# Patient Record
Sex: Female | Born: 1970 | Race: Black or African American | Hispanic: No | Marital: Married | Smoking: Never smoker
Health system: Southern US, Community
[De-identification: ages and names within clinical notes are randomized; demographics above are authoritative.]

## PROBLEM LIST (undated history)

## (undated) ENCOUNTER — Ambulatory Visit: Discharge status: Absent without leave | Payer: BC Managed Care – PPO

## (undated) DIAGNOSIS — Z8719 Personal history of other diseases of the digestive system: Secondary | ICD-10-CM

## (undated) DIAGNOSIS — K219 Gastro-esophageal reflux disease without esophagitis: Secondary | ICD-10-CM

## (undated) DIAGNOSIS — E559 Vitamin D deficiency, unspecified: Secondary | ICD-10-CM

## (undated) DIAGNOSIS — D649 Anemia, unspecified: Secondary | ICD-10-CM

## (undated) DIAGNOSIS — K2970 Gastritis, unspecified, without bleeding: Secondary | ICD-10-CM

## (undated) DIAGNOSIS — R42 Dizziness and giddiness: Secondary | ICD-10-CM

## (undated) DIAGNOSIS — E611 Iron deficiency: Secondary | ICD-10-CM

## (undated) DIAGNOSIS — Z9889 Other specified postprocedural states: Secondary | ICD-10-CM

## (undated) DIAGNOSIS — H8109 Meniere's disease, unspecified ear: Secondary | ICD-10-CM

## (undated) DIAGNOSIS — I10 Essential (primary) hypertension: Secondary | ICD-10-CM

## (undated) HISTORY — DX: Dizziness and giddiness: R42

## (undated) HISTORY — DX: Meniere's disease, unspecified ear: H81.09

## (undated) HISTORY — DX: Iron deficiency: E61.1

## (undated) HISTORY — DX: Vitamin D deficiency, unspecified: E55.9

---

## 2001-06-20 ENCOUNTER — Other Ambulatory Visit: Admission: RE | Admit: 2001-06-20 | Discharge: 2001-06-20 | Payer: Self-pay | Admitting: Specialist

## 2003-10-19 ENCOUNTER — Emergency Department (HOSPITAL_COMMUNITY): Admission: EM | Admit: 2003-10-19 | Discharge: 2003-10-19 | Payer: Self-pay | Admitting: Internal Medicine

## 2004-01-11 ENCOUNTER — Emergency Department (HOSPITAL_COMMUNITY): Admission: EM | Admit: 2004-01-11 | Discharge: 2004-01-11 | Payer: Self-pay | Admitting: Emergency Medicine

## 2004-04-19 ENCOUNTER — Emergency Department (HOSPITAL_COMMUNITY): Admission: EM | Admit: 2004-04-19 | Discharge: 2004-04-19 | Payer: Self-pay | Admitting: Emergency Medicine

## 2004-04-26 ENCOUNTER — Emergency Department (HOSPITAL_COMMUNITY): Admission: EM | Admit: 2004-04-26 | Discharge: 2004-04-26 | Payer: Self-pay | Admitting: Emergency Medicine

## 2004-05-05 ENCOUNTER — Ambulatory Visit (HOSPITAL_COMMUNITY): Admission: RE | Admit: 2004-05-05 | Discharge: 2004-05-05 | Payer: Self-pay | Admitting: Emergency Medicine

## 2005-12-03 ENCOUNTER — Emergency Department (HOSPITAL_COMMUNITY): Admission: EM | Admit: 2005-12-03 | Discharge: 2005-12-03 | Payer: Self-pay | Admitting: Emergency Medicine

## 2005-12-06 ENCOUNTER — Ambulatory Visit (HOSPITAL_COMMUNITY): Admission: RE | Admit: 2005-12-06 | Discharge: 2005-12-06 | Payer: Self-pay | Admitting: Dermatology

## 2006-04-27 ENCOUNTER — Emergency Department (HOSPITAL_COMMUNITY): Admission: EM | Admit: 2006-04-27 | Discharge: 2006-04-27 | Payer: Self-pay | Admitting: Emergency Medicine

## 2007-03-05 ENCOUNTER — Emergency Department (HOSPITAL_COMMUNITY): Admission: EM | Admit: 2007-03-05 | Discharge: 2007-03-05 | Payer: Self-pay | Admitting: Family Medicine

## 2007-09-01 ENCOUNTER — Emergency Department (HOSPITAL_COMMUNITY): Admission: EM | Admit: 2007-09-01 | Discharge: 2007-09-01 | Payer: Self-pay | Admitting: Emergency Medicine

## 2007-10-16 ENCOUNTER — Emergency Department (HOSPITAL_COMMUNITY): Admission: EM | Admit: 2007-10-16 | Discharge: 2007-10-16 | Payer: Self-pay | Admitting: Emergency Medicine

## 2007-12-27 ENCOUNTER — Emergency Department (HOSPITAL_COMMUNITY): Admission: EM | Admit: 2007-12-27 | Discharge: 2007-12-27 | Payer: Self-pay | Admitting: Emergency Medicine

## 2008-02-09 ENCOUNTER — Emergency Department (HOSPITAL_COMMUNITY): Admission: EM | Admit: 2008-02-09 | Discharge: 2008-02-09 | Payer: Self-pay | Admitting: Emergency Medicine

## 2008-03-13 ENCOUNTER — Encounter: Admission: RE | Admit: 2008-03-13 | Discharge: 2008-03-13 | Payer: Self-pay | Admitting: Otolaryngology

## 2009-08-03 ENCOUNTER — Emergency Department (HOSPITAL_COMMUNITY): Admission: EM | Admit: 2009-08-03 | Discharge: 2009-08-03 | Payer: Self-pay | Admitting: Emergency Medicine

## 2010-07-29 ENCOUNTER — Emergency Department (HOSPITAL_COMMUNITY): Admission: EM | Admit: 2010-07-29 | Discharge: 2010-07-29 | Payer: Self-pay | Admitting: Emergency Medicine

## 2010-10-01 ENCOUNTER — Ambulatory Visit (HOSPITAL_COMMUNITY): Admission: RE | Admit: 2010-10-01 | Discharge: 2010-10-01 | Payer: Self-pay | Admitting: Family Medicine

## 2011-02-03 LAB — RAPID STREP SCREEN (MED CTR MEBANE ONLY): Streptococcus, Group A Screen (Direct): NEGATIVE

## 2012-10-04 ENCOUNTER — Ambulatory Visit (INDEPENDENT_AMBULATORY_CARE_PROVIDER_SITE_OTHER): Payer: Self-pay | Admitting: Otolaryngology

## 2012-11-19 ENCOUNTER — Emergency Department (HOSPITAL_COMMUNITY): Payer: Managed Care, Other (non HMO)

## 2012-11-19 ENCOUNTER — Emergency Department (HOSPITAL_COMMUNITY)
Admission: EM | Admit: 2012-11-19 | Discharge: 2012-11-19 | Disposition: A | Discharge status: Home / Self Care | Payer: Managed Care, Other (non HMO) | Attending: Emergency Medicine | Admitting: Emergency Medicine

## 2012-11-19 ENCOUNTER — Encounter (HOSPITAL_COMMUNITY): Payer: Self-pay | Admitting: Emergency Medicine

## 2012-11-19 DIAGNOSIS — R002 Palpitations: Secondary | ICD-10-CM | POA: Insufficient documentation

## 2012-11-19 DIAGNOSIS — R000 Tachycardia, unspecified: Secondary | ICD-10-CM

## 2012-11-19 DIAGNOSIS — R112 Nausea with vomiting, unspecified: Secondary | ICD-10-CM | POA: Insufficient documentation

## 2012-11-19 DIAGNOSIS — Z3202 Encounter for pregnancy test, result negative: Secondary | ICD-10-CM | POA: Insufficient documentation

## 2012-11-19 DIAGNOSIS — R5381 Other malaise: Secondary | ICD-10-CM | POA: Insufficient documentation

## 2012-11-19 LAB — URINALYSIS, ROUTINE W REFLEX MICROSCOPIC
Bilirubin Urine: NEGATIVE
Glucose, UA: NEGATIVE mg/dL
Protein, ur: NEGATIVE mg/dL
Urobilinogen, UA: 0.2 mg/dL (ref 0.0–1.0)

## 2012-11-19 LAB — COMPREHENSIVE METABOLIC PANEL
ALT: 10 U/L (ref 0–35)
Alkaline Phosphatase: 53 U/L (ref 39–117)
BUN: 9 mg/dL (ref 6–23)
CO2: 24 mEq/L (ref 19–32)
Chloride: 107 mEq/L (ref 96–112)
GFR calc Af Amer: 78 mL/min — ABNORMAL LOW (ref >90)
Glucose, Bld: 90 mg/dL (ref 70–99)
Potassium: 3.4 mEq/L — ABNORMAL LOW (ref 3.5–5.1)
Total Bilirubin: 0.8 mg/dL (ref 0.3–1.2)

## 2012-11-19 LAB — POCT I-STAT TROPONIN I

## 2012-11-19 LAB — URINE MICROSCOPIC-ADD ON

## 2012-11-19 LAB — CBC WITH DIFFERENTIAL/PLATELET
Hemoglobin: 13.9 g/dL (ref 12.0–15.0)
Lymphocytes Relative: 15 % (ref 12–46)
Lymphs Abs: 1.6 10*3/uL (ref 0.7–4.0)
MCH: 29.7 pg (ref 26.0–34.0)
Monocytes Relative: 5 % (ref 3–12)
Neutro Abs: 8.4 10*3/uL — ABNORMAL HIGH (ref 1.7–7.7)
Neutrophils Relative %: 79 % — ABNORMAL HIGH (ref 43–77)
RBC: 4.68 MIL/uL (ref 3.87–5.11)
WBC: 10.7 10*3/uL — ABNORMAL HIGH (ref 4.0–10.5)

## 2012-11-19 MED ORDER — IOHEXOL 350 MG/ML SOLN
100.0000 mL | Freq: Once | INTRAVENOUS | Status: AC | PRN
  Administered 2012-11-19: 100 mL via INTRAVENOUS

## 2012-11-19 MED ORDER — SODIUM CHLORIDE 0.9 % IV SOLN: INTRAVENOUS | Status: DC

## 2012-11-19 MED ORDER — IBUPROFEN 800 MG PO TABS
800.0000 mg | ORAL_TABLET | Freq: Once | ORAL | Status: AC
  Administered 2012-11-19: 800 mg via ORAL
  Filled 2012-11-19: qty 1

## 2012-11-19 MED ORDER — SODIUM CHLORIDE 0.9 % IV BOLUS (SEPSIS)
1000.0000 mL | Freq: Once | INTRAVENOUS | Status: AC
  Administered 2012-11-19: 1000 mL via INTRAVENOUS

## 2012-11-19 NOTE — ED Provider Notes (Addendum)
History   This chart was scribed for Toy Baker, MD by Charolett Bumpers, ED Scribe. The patient was seen in room APA06/APA06. Patient's care was started at 1510.   CSN: [id number]  Arrival date & time 11/19/12  1506   First MD Initiated Contact with Patient 11/19/12 1510      Chief Complaint  Patient presents with  . Tachycardia  . Fatigue    The history is provided by the patient. No language interpreter was used.  Karla Ward is a 42 y.o. female who presents to the Emergency Department complaining of sudden onset palpitations with associated fatigue, nausea and one episode of vomiting that started last night after coming home. She denies any illness prior to onset. She also reports chest pain described as a discomfort and pounding sensation that occurs with deep breaths. She reports an episode of similar symptoms that spontaneously resolved in the past but has not seen a cardiologist or been dx. She denies any recent illnesses. She denies any SOB, cough, fever, leg pain, leg swelling. She denies any recent travel or excessive caffeine use. She denies any drug use or h/o thyroid problems. She denies any chance of pregnancy.   PCP: Dr. Sudie Bailey   History reviewed. No pertinent past medical history.  History reviewed. No pertinent past surgical history.  History reviewed. No pertinent family history.  History  Substance Use Topics  . Smoking status: Never Smoker   . Smokeless tobacco: Never Used  . Alcohol Use: No    OB History    Grav Para Term Preterm Abortions TAB SAB Ect Mult Living                  Review of Systems  Constitutional: Positive for fatigue. Negative for fever.  Respiratory: Negative for cough and shortness of breath.   Cardiovascular: Positive for chest pain and palpitations. Negative for leg swelling.  Gastrointestinal: Positive for nausea and vomiting.  All other systems reviewed and are negative.    Allergies  Penicillins  Home  Medications  No current outpatient prescriptions on file.  BP 141/96  Pulse 95  Temp 98.2 F (36.8 C)  Resp 20  SpO2 100%  Physical Exam  Nursing note and vitals reviewed. Constitutional: She is oriented to person, place, and time. She appears well-developed and well-nourished.  Non-toxic appearance. No distress.  HENT:  Head: Normocephalic and atraumatic.  Eyes: Conjunctivae normal, EOM and lids are normal. Pupils are equal, round, and reactive to light.  Neck: Normal range of motion. Neck supple. No tracheal deviation present. No mass present.       No nodules or palpable goiter.   Cardiovascular: Regular rhythm and normal heart sounds.  Tachycardia present.  Exam reveals no gallop.   No murmur heard. Pulmonary/Chest: Effort normal and breath sounds normal. No stridor. No respiratory distress. She has no decreased breath sounds. She has no wheezes. She has no rhonchi. She has no rales.  Abdominal: Soft. Normal appearance and bowel sounds are normal. She exhibits no distension. There is no tenderness. There is no rebound and no CVA tenderness.  Musculoskeletal: Normal range of motion. She exhibits no edema and no tenderness.  Neurological: She is alert and oriented to person, place, and time. She has normal strength. No cranial nerve deficit or sensory deficit. GCS eye subscore is 4. GCS verbal subscore is 5. GCS motor subscore is 6.  Skin: Skin is warm and dry. No abrasion and no rash noted.  Psychiatric: She  has a normal mood and affect. Her speech is normal and behavior is normal.    ED Course  Procedures (including critical care time)  DIAGNOSTIC STUDIES: Oxygen Saturation is 100% on room air, normal by my interpretation.    COORDINATION OF CARE:  15:28-Discussed planned course of treatment with the patient including IV fluids, chest CT, UA and blood work, who is agreeable at this time.    Labs Reviewed - No data to display No results found.   No diagnosis  found.    MDM      Rate: 95   Rhythm: normal sinus rhythm  QRS Axis: normal  Intervals: normal  ST/T Wave abnormalities: normal  Conduction Disutrbances:none  Narrative Interpretation:   Old EKG Reviewed: none available   I personally performed the services described in this documentation, which was scribed in my presence. The recorded information has been reviewed and is accurate.  Pt with negative w/u here, thyroid fx pending, no signs of pe, stable for d/c No concern for acs    Toy Baker, MD 11/19/12 1712  Toy Baker, MD 11/19/12 [phone]

## 2012-11-19 NOTE — ED Notes (Signed)
Pt c/o "rapid heart beat" and fatigue since last night. Pt states last night she began having chest discomfort and she could "feel my heart pounding very fast". Pt also states when she stands up she becomes fatigued. Pt only reports chest pain when inhales heavily. Pt reports 1 episode on vomiting pta and still reports nausea. Pt states she's had these symptoms in the past but has never been tx or diagnosed for anything related. Pt states symptoms are worse than previous episodes.

## 2012-11-20 LAB — T4: T4, Total: 8.6 ug/dL (ref 5.0–12.5)

## 2012-11-21 LAB — URINE CULTURE: Culture: NO GROWTH

## 2012-11-22 ENCOUNTER — Ambulatory Visit (INDEPENDENT_AMBULATORY_CARE_PROVIDER_SITE_OTHER): Payer: Managed Care, Other (non HMO) | Admitting: Cardiovascular Disease

## 2012-11-22 ENCOUNTER — Encounter: Payer: Self-pay | Admitting: Cardiovascular Disease

## 2012-11-22 VITALS — BP 144/100 | HR 80 | Ht 64.0 in | Wt 172.0 lb

## 2012-11-22 DIAGNOSIS — R002 Palpitations: Secondary | ICD-10-CM | POA: Insufficient documentation

## 2012-11-22 DIAGNOSIS — K30 Functional dyspepsia: Secondary | ICD-10-CM

## 2012-11-22 DIAGNOSIS — K3189 Other diseases of stomach and duodenum: Secondary | ICD-10-CM

## 2012-11-22 NOTE — Assessment & Plan Note (Signed)
Benign sounding ? Secondary to infuenza or gi virus.  Echo to r/o structural heart disease.  Offered event monitor if symptoms continue

## 2012-11-22 NOTE — Patient Instructions (Signed)
Your physician recommends that you schedule a follow-up appointment in: As needed  Your physician has requested that you have an echocardiogram. Echocardiography is a painless test that uses sound waves to create images of your heart. It provides your doctor with information about the size and shape of your heart and how well your heart's chambers and valves are working. This procedure takes approximately one hour. There are no restrictions for this procedure.  Call me next week if you are still experiencing palpitations and we will order an event monitor

## 2012-11-22 NOTE — Assessment & Plan Note (Signed)
Some influenza virus' can have GI overtones I think this is what is causing her symptoms and fatigue  F/U Dr Sudie Bailey Stay hydrated  Not started on Tamaflu in ER No clinical evidence of GB disease

## 2012-11-22 NOTE — Progress Notes (Signed)
Patient ID: Karla Ward, female   DOB: [birth year], 42 y.o.   MRN: [id number] Karla Ward is a 42 y.o. female who presents to the Emergency Department complaining of sudden onset palpitations with associated fatigue, nausea and one episode of vomiting that started last night after coming home. She denies any illness prior to onset. She also reports chest pain described as a discomfort and pounding sensation that occurs with deep breaths. She reports an episode of similar symptoms that spontaneously resolved in the past but has not seen a cardiologist or been dx. She denies any recent illnesses. She denies any SOB, cough, fever, leg pain, leg swelling. She denies any recent travel or excessive caffeine use. She denies any drug use or h/o thyroid problems. TSH and CBC normal in ER  Still with some stomach upset and one episode of vohmiting  Feels pulse is high especially in recumbancy No fevers  ROS: Denies fever, malais, weight loss, blurry vision, decreased visual acuity, cough, sputum, SOB, hemoptysis, pleuritic pain, palpitaitons, heartburn, abdominal pain, melena, lower extremity edema, claudication, or rash.  All other systems reviewed and negative   General: Affect appropriate Healthy:  appears stated age HEENT: normal Neck supple with no adenopathy JVP normal no bruits no thyromegaly Lungs clear with no wheezing and good diaphragmatic motion Heart:  S1/S2 no murmur,rub, gallop or click PMI normal Abdomen: benighn, BS positve, no tenderness, no AAA no bruit.  No HSM or HJR Distal pulses intact with no bruits No edema Neuro non-focal Skin warm and dry No muscular weakness  Medications Current Outpatient Prescriptions  Medication Sig Dispense Refill  . loratadine (CLARITIN) 10 MG tablet Take 10 mg by mouth as needed.       Marland Kitchen LORazepam (ATIVAN) 1 MG tablet Take 1 mg by mouth daily as needed. *To be taken at the first sign of a muscle tension headache      . medroxyPROGESTERone  (DEPO-PROVERA) 150 MG/ML injection Inject 150 mg into the muscle every 3 (three) months.        Allergies Penicillins  Family History: No family history on file.  Social History: History   Social History  . Marital Status: Married    Spouse Name: N/A    Number of Children: N/A  . Years of Education: N/A   Occupational History  . Not on file.   Social History Main Topics  . Smoking status: Never Smoker   . Smokeless tobacco: Never Used  . Alcohol Use: No  . Drug Use:   . Sexually Active:    Other Topics Concern  . Not on file   Social History Narrative  . No narrative on file    Electrocardiogram: 1/21  SR rate 91 normal  Assessment and Plan

## 2012-11-26 ENCOUNTER — Ambulatory Visit (HOSPITAL_COMMUNITY): Payer: Managed Care, Other (non HMO)

## 2012-11-28 ENCOUNTER — Ambulatory Visit (HOSPITAL_COMMUNITY)
Admission: RE | Admit: 2012-11-28 | Discharge: 2012-11-28 | Disposition: A | Discharge status: Home / Self Care | Payer: Managed Care, Other (non HMO) | Source: Ambulatory Visit | Attending: Cardiovascular Disease | Admitting: Cardiovascular Disease

## 2012-11-28 DIAGNOSIS — R002 Palpitations: Secondary | ICD-10-CM

## 2012-11-28 DIAGNOSIS — R079 Chest pain, unspecified: Secondary | ICD-10-CM | POA: Insufficient documentation

## 2012-11-28 DIAGNOSIS — K30 Functional dyspepsia: Secondary | ICD-10-CM

## 2012-11-28 NOTE — Progress Notes (Signed)
*  PRELIMINARY RESULTS* Echocardiogram 2D Echocardiogram has been performed.  Conrad Anderson 11/28/2012, 1:47 PM

## 2012-11-30 ENCOUNTER — Emergency Department (HOSPITAL_COMMUNITY)
Admission: EM | Admit: 2012-11-30 | Discharge: 2012-11-30 | Disposition: A | Discharge status: Home / Self Care | Payer: Managed Care, Other (non HMO) | Attending: Emergency Medicine | Admitting: Emergency Medicine

## 2012-11-30 ENCOUNTER — Encounter (HOSPITAL_COMMUNITY): Payer: Self-pay | Admitting: *Deleted

## 2012-11-30 DIAGNOSIS — Z8679 Personal history of other diseases of the circulatory system: Secondary | ICD-10-CM | POA: Insufficient documentation

## 2012-11-30 DIAGNOSIS — R51 Headache: Secondary | ICD-10-CM | POA: Insufficient documentation

## 2012-11-30 DIAGNOSIS — K529 Noninfective gastroenteritis and colitis, unspecified: Secondary | ICD-10-CM

## 2012-11-30 DIAGNOSIS — R059 Cough, unspecified: Secondary | ICD-10-CM | POA: Insufficient documentation

## 2012-11-30 DIAGNOSIS — IMO0001 Reserved for inherently not codable concepts without codable children: Secondary | ICD-10-CM | POA: Insufficient documentation

## 2012-11-30 DIAGNOSIS — Z79899 Other long term (current) drug therapy: Secondary | ICD-10-CM | POA: Insufficient documentation

## 2012-11-30 DIAGNOSIS — R112 Nausea with vomiting, unspecified: Secondary | ICD-10-CM | POA: Insufficient documentation

## 2012-11-30 DIAGNOSIS — Z3202 Encounter for pregnancy test, result negative: Secondary | ICD-10-CM | POA: Insufficient documentation

## 2012-11-30 DIAGNOSIS — K5289 Other specified noninfective gastroenteritis and colitis: Secondary | ICD-10-CM | POA: Insufficient documentation

## 2012-11-30 DIAGNOSIS — R05 Cough: Secondary | ICD-10-CM | POA: Insufficient documentation

## 2012-11-30 LAB — CBC WITH DIFFERENTIAL/PLATELET
Basophils Absolute: 0 10*3/uL (ref 0.0–0.1)
Basophils Relative: 0 % (ref 0–1)
Eosinophils Relative: 3 % (ref 0–5)
HCT: 42.6 % (ref 36.0–46.0)
Hemoglobin: 14.4 g/dL (ref 12.0–15.0)
MCH: 30 pg (ref 26.0–34.0)
MCHC: 33.8 g/dL (ref 30.0–36.0)
MCV: 88.8 fL (ref 78.0–100.0)
Monocytes Absolute: 0.4 10*3/uL (ref 0.1–1.0)
Monocytes Relative: 5 % (ref 3–12)
RDW: 13.5 % (ref 11.5–15.5)

## 2012-11-30 LAB — URINALYSIS, ROUTINE W REFLEX MICROSCOPIC
Leukocytes, UA: NEGATIVE
Nitrite: NEGATIVE
Specific Gravity, Urine: 1.02 (ref 1.005–1.030)
pH: 7 (ref 5.0–8.0)

## 2012-11-30 LAB — HEPATIC FUNCTION PANEL
Albumin: 4.1 g/dL (ref 3.5–5.2)
Alkaline Phosphatase: 51 U/L (ref 39–117)
Total Protein: 7.8 g/dL (ref 6.0–8.3)

## 2012-11-30 LAB — URINE MICROSCOPIC-ADD ON

## 2012-11-30 LAB — BASIC METABOLIC PANEL
CO2: 25 mEq/L (ref 19–32)
Calcium: 9.8 mg/dL (ref 8.4–10.5)
Sodium: 139 mEq/L (ref 135–145)

## 2012-11-30 LAB — LIPASE, BLOOD: Lipase: 30 U/L (ref 11–59)

## 2012-11-30 LAB — POCT PREGNANCY, URINE: Preg Test, Ur: NEGATIVE

## 2012-11-30 MED ORDER — PROMETHAZINE HCL 12.5 MG PO TABS
12.5000 mg | ORAL_TABLET | Freq: Once | ORAL | Status: DC
  Filled 2012-11-30: qty 1

## 2012-11-30 MED ORDER — HYDROCODONE-ACETAMINOPHEN 5-325 MG PO TABS
2.0000 | ORAL_TABLET | Freq: Once | ORAL | Status: DC
  Filled 2012-11-30: qty 2

## 2012-11-30 MED ORDER — HYDROCODONE-ACETAMINOPHEN 5-325 MG PO TABS: ORAL_TABLET | ORAL | Status: DC

## 2012-11-30 MED ORDER — ONDANSETRON HCL 4 MG PO TABS: 4.0000 mg | ORAL_TABLET | Freq: Four times a day (QID) | ORAL | Status: DC

## 2012-11-30 MED ORDER — ONDANSETRON 4 MG PO TBDP
4.0000 mg | ORAL_TABLET | Freq: Once | ORAL | Status: AC
  Administered 2012-11-30: 4 mg via ORAL
  Filled 2012-11-30: qty 1

## 2012-11-30 MED ORDER — IBUPROFEN 800 MG PO TABS
800.0000 mg | ORAL_TABLET | Freq: Once | ORAL | Status: DC
  Filled 2012-11-30: qty 1

## 2012-11-30 NOTE — ED Notes (Signed)
Fever, cough, headache, body and chest aches, nonproductive cough

## 2012-11-30 NOTE — ED Notes (Signed)
Medications given to Karla Ward, EDPa for pt to take at home. Not administered here, pt driving.

## 2012-11-30 NOTE — ED Provider Notes (Signed)
History     CSN: [id number]  Arrival date & time 11/30/12  1908   First MD Initiated Contact with Patient 11/30/12 2015      Chief Complaint  Patient presents with  . Fever    (Consider location/radiation/quality/duration/timing/severity/associated sxs/prior treatment) Patient is a 42 y.o. female presenting with fever. The history is provided by the patient.  Fever Primary symptoms of the febrile illness include fever, headaches, cough, nausea, vomiting and myalgias. Primary symptoms do not include wheezing, shortness of breath, abdominal pain, dysuria or arthralgias. The current episode started 2 days ago. This is a new problem. The problem has not changed since onset. The headache is not associated with photophobia.  Risk factors for febrile illness include diabetes mellitus.Primary symptoms comment: temp max 102.2 earlier today.    History reviewed. No pertinent past medical history.  Past Surgical History  Procedure Date  . Rapid heart rate     History reviewed. No pertinent family history.  History  Substance Use Topics  . Smoking status: Never Smoker   . Smokeless tobacco: Never Used  . Alcohol Use: No    OB History    Grav Para Term Preterm Abortions TAB SAB Ect Mult Living                  Review of Systems  Constitutional: Positive for fever. Negative for activity change.       All ROS Neg except as noted in HPI  HENT: Negative for nosebleeds and neck pain.   Eyes: Negative for photophobia and discharge.  Respiratory: Positive for cough. Negative for shortness of breath and wheezing.   Cardiovascular: Negative for chest pain and palpitations.  Gastrointestinal: Positive for nausea and vomiting. Negative for abdominal pain and blood in stool.  Genitourinary: Negative for dysuria, frequency and hematuria.  Musculoskeletal: Positive for myalgias. Negative for back pain and arthralgias.  Skin: Negative.   Neurological: Positive for headaches. Negative for  dizziness, seizures and speech difficulty.  Psychiatric/Behavioral: Negative for hallucinations and confusion.    Allergies  Penicillins  Home Medications   Current Outpatient Rx  Name  Route  Sig  Dispense  Refill  . LORATADINE 10 MG PO TABS   Oral   Take 10 mg by mouth as needed.          Marland Kitchen LORAZEPAM 1 MG PO TABS   Oral   Take 1 mg by mouth daily as needed. *To be taken at the first sign of a muscle tension headache         . MEDROXYPROGESTERONE ACETATE 150 MG/ML IM SUSP   Intramuscular   Inject 150 mg into the muscle every 3 (three) months.           BP 141/81  Temp 98.1 F (36.7 C) (Oral)  Resp 18  Ht 5\' 4"  (1.626 m)  Wt 172 lb (78.019 kg)  BMI 29.52 kg/m2  SpO2 100%  Physical Exam  Nursing note and vitals reviewed. Constitutional: She is oriented to person, place, and time. She appears well-developed and well-nourished.  Non-toxic appearance.  HENT:  Head: Normocephalic.  Right Ear: Tympanic membrane and external ear normal.  Left Ear: Tympanic membrane and external ear normal.  Eyes: EOM and lids are normal. Pupils are equal, round, and reactive to light.  Neck: Normal range of motion. Neck supple. Carotid bruit is not present.  Cardiovascular: Normal rate, regular rhythm, normal heart sounds, intact distal pulses and normal pulses.   Pulmonary/Chest: Breath sounds normal. No respiratory  distress.  Abdominal: Soft. Bowel sounds are normal. There is no tenderness. There is no guarding.  Musculoskeletal: Normal range of motion.  Lymphadenopathy:       Head (right side): No submandibular adenopathy present.       Head (left side): No submandibular adenopathy present.    She has no cervical adenopathy.  Neurological: She is alert and oriented to person, place, and time. She has normal strength. No cranial nerve deficit or sensory deficit.  Skin: Skin is warm and dry.  Psychiatric: She has a normal mood and affect. Her speech is normal.    ED Course   Procedures (including critical care time)  Labs Reviewed - No data to display No results found.   No diagnosis found.    MDM  I have reviewed nursing notes, vital signs, and all appropriate lab and imaging results for this patient. Metabolic panel is well within normal limits. A function panel is normal. The lipase is normal. Complete blood count is normal. Urine analysis shows many epithelial cells, nitrates and leukocyte esterase are negative. Pregnancy test is negative. No vomiting noted in the emergency department tonight. No unusual cough. The speech is clear, gait is steady.  Plan at this time is for the patient to be on clear liquids for the next 24-48 hours, with gradual increase in advance in diet. Patient will be treated with Zofran every 6 hours for nausea. Norco every 4 hours #15 tablets for pain and aching. Suspect the patient has a viral gastroenteritis. Patient is to return to the emergency department if any changes, problems, or concerns.      Kathie Dike, [place] 11/30/12 2233

## 2012-12-02 LAB — URINE CULTURE

## 2012-12-02 NOTE — ED Provider Notes (Signed)
Medical screening examination/treatment/procedure(s) were performed by non-physician practitioner and as supervising physician I was immediately available for consultation/collaboration.   Kimball Appleby M Nader Boys, DO 12/02/12 1931 

## 2012-12-20 ENCOUNTER — Encounter: Payer: Self-pay | Admitting: Cardiovascular Disease

## 2012-12-20 ENCOUNTER — Ambulatory Visit (INDEPENDENT_AMBULATORY_CARE_PROVIDER_SITE_OTHER): Payer: Managed Care, Other (non HMO) | Admitting: Cardiovascular Disease

## 2012-12-20 VITALS — BP 138/90 | HR 83 | Wt 175.0 lb

## 2012-12-20 DIAGNOSIS — R002 Palpitations: Secondary | ICD-10-CM

## 2012-12-20 MED ORDER — PROPRANOLOL HCL 10 MG PO TABS: 10.0000 mg | ORAL_TABLET | ORAL | Status: DC | PRN

## 2012-12-20 NOTE — Progress Notes (Signed)
Patient ID: Karla Ward, female   DOB: 07/23/1971, 42 y.o.   MRN: [id number] SUNAINA Ward is a 42 y.o. female who presented  to the Emergency Department January  complaining of sudden onset palpitations with associated fatigue, nausea and one episode of vomiting that started last night after coming home. She denies any illness prior to onset. She also reports chest pain described as a discomfort and pounding sensation that occurs with deep breaths. She reports an episode of similar symptoms that spontaneously resolved in the past but has not seen a cardiologist or been dx. She denies any recent illnesses. She denies any SOB, cough, fever, leg pain, leg swelling. She denies any recent travel or excessive caffeine use. She denies any drug use or h/o thyroid problems. TSH and CBC normal in ER Still with some stomach upset and one episode of vohmiting Feels pulse is high especially in recumbancy No fevers  Echo 1/29  Normal  Still having palpitations frequently  Recently seen in ER with gastroenteritis which made palpitatons worse.  Mostly at night   ROS: Denies fever, malais, weight loss, blurry vision, decreased visual acuity, cough, sputum, SOB, hemoptysis, pleuritic pain, palpitaitons, heartburn, abdominal pain, melena, lower extremity edema, claudication, or rash.  All other systems reviewed and negative  General: Affect appropriate Healthy:  appears stated age HEENT: normal Neck supple with no adenopathy JVP normal no bruits no thyromegaly Lungs clear with no wheezing and good diaphragmatic motion Heart:  S1/S2 no murmur, no rub, gallop or click PMI normal Abdomen: benighn, BS positve, no tenderness, no AAA no bruit.  No HSM or HJR Distal pulses intact with no bruits No edema Neuro non-focal Skin warm and dry No muscular weakness   Current Outpatient Prescriptions  Medication Sig Dispense Refill  . HYDROcodone-acetaminophen (NORCO/VICODIN) 5-325 MG per tablet 1 or 2 po q4h prn  pain  15 tablet  0  . loratadine (CLARITIN) 10 MG tablet Take 10 mg by mouth as needed.       Marland Kitchen LORazepam (ATIVAN) 1 MG tablet Take 1 mg by mouth daily as needed. *To be taken at the first sign of a muscle tension headache      . medroxyPROGESTERone (DEPO-PROVERA) 150 MG/ML injection Inject 150 mg into the muscle every 3 (three) months.      . ondansetron (ZOFRAN) 4 MG tablet Take 1 tablet (4 mg total) by mouth every 6 (six) hours.  12 tablet  0   No current facility-administered medications for this visit.    Allergies  Penicillins  Electrocardiogram:  SR rate 83  Normal   Assessment and Plan

## 2012-12-20 NOTE — Addendum Note (Signed)
Addended by: Reather Laurence A on: 12/20/2012 02:31 PM   Modules accepted: Orders

## 2012-12-20 NOTE — Assessment & Plan Note (Signed)
Persistant and bothersome.  Normal ECG and echo  Event monitor to be ordered.  PRN Inderal f/u in 8 weeks

## 2012-12-20 NOTE — Patient Instructions (Addendum)
Your physician recommends that you schedule a follow-up appointment in: 1 month  Your physician has recommended that you wear an event monitor. Event monitors are medical devices that record the heart's electrical activity. Doctors most often [place] these monitors to diagnose arrhythmias. Arrhythmias are problems with the speed or rhythm of the heartbeat. The monitor is a small, portable device. You can wear one while you do your normal daily activities. This is usually used to diagnose what is causing palpitations/syncope (passing out).  Your physician has recommended you make the following change in your medication:  1 - START Inderal 10 mg as needed for palpitations

## 2013-01-03 ENCOUNTER — Other Ambulatory Visit (HOSPITAL_COMMUNITY): Payer: Self-pay | Admitting: Nurse Practitioner

## 2013-01-08 ENCOUNTER — Inpatient Hospital Stay (HOSPITAL_COMMUNITY): Admission: RE | Admit: 2013-01-08 | Payer: Managed Care, Other (non HMO) | Source: Ambulatory Visit

## 2013-01-10 ENCOUNTER — Telehealth: Payer: Self-pay | Admitting: *Deleted

## 2013-01-10 NOTE — Telephone Encounter (Signed)
Noted pt to have [employer] completed on 01-09-13, called [employer] rep and was advised the pt has no activity for this pt since in two weeks and the pt had several contact phone calls/vm left from [employer],advised to contacin case no call back per incoming calls from [employer] listed as 800 number and some pt ignore those calls, contacted pt to clarify, pt noted she removed the monitor periodically during the 21day duration due to skin irritation, pt states she recorded the times/dates that she had the monitor on however does not have that information with her during phone call, pt noted she did mostly wear at night per caused skin to peel and did wear until 01-09-13, advised once the report is read we will contact her with the results, pt understood and will bring in monitor dates/times of wear and cardionet equipment

## 2013-01-14 ENCOUNTER — Other Ambulatory Visit: Payer: Self-pay | Admitting: Cardiovascular Disease

## 2013-01-14 DIAGNOSIS — R002 Palpitations: Secondary | ICD-10-CM

## 2013-01-14 NOTE — Telephone Encounter (Signed)
Pt card[employer]et end o summary report reviewed by MD Tenny Craw, faxed to ordering physician for review and pt will contacted when appropriate

## 2013-01-22 ENCOUNTER — Ambulatory Visit: Payer: Managed Care, Other (non HMO) | Admitting: Cardiovascular Disease

## 2013-02-12 ENCOUNTER — Telehealth: Payer: Self-pay | Admitting: Cardiovascular Disease

## 2013-02-20 DIAGNOSIS — R079 Chest pain, unspecified: Secondary | ICD-10-CM

## 2013-03-28 ENCOUNTER — Ambulatory Visit (HOSPITAL_COMMUNITY)
Admission: RE | Admit: 2013-03-28 | Discharge: 2013-03-28 | Disposition: A | Discharge status: Home / Self Care | Payer: Self-pay | Source: Ambulatory Visit | Attending: Nurse Practitioner | Admitting: Nurse Practitioner

## 2013-03-28 DIAGNOSIS — Z139 Encounter for screening, unspecified: Secondary | ICD-10-CM

## 2013-03-29 ENCOUNTER — Other Ambulatory Visit: Payer: Self-pay | Admitting: Nurse Practitioner

## 2013-03-29 DIAGNOSIS — R928 Other abnormal and inconclusive findings on diagnostic imaging of breast: Secondary | ICD-10-CM

## 2013-04-03 ENCOUNTER — Ambulatory Visit (HOSPITAL_COMMUNITY)
Admission: RE | Admit: 2013-04-03 | Discharge: 2013-04-03 | Disposition: A | Discharge status: Home / Self Care | Payer: Managed Care, Other (non HMO) | Source: Ambulatory Visit | Attending: Nurse Practitioner | Admitting: Nurse Practitioner

## 2013-04-03 ENCOUNTER — Other Ambulatory Visit: Payer: Self-pay | Admitting: Nurse Practitioner

## 2013-04-03 DIAGNOSIS — R928 Other abnormal and inconclusive findings on diagnostic imaging of breast: Secondary | ICD-10-CM

## 2013-04-10 ENCOUNTER — Encounter (HOSPITAL_COMMUNITY): Payer: Managed Care, Other (non HMO)

## 2013-12-31 ENCOUNTER — Encounter (HOSPITAL_COMMUNITY): Payer: Self-pay | Admitting: Emergency Medicine

## 2013-12-31 ENCOUNTER — Emergency Department (HOSPITAL_COMMUNITY)
Admission: EM | Admit: 2013-12-31 | Discharge: 2013-12-31 | Disposition: A | Discharge status: Home / Self Care | Payer: Managed Care, Other (non HMO) | Attending: Emergency Medicine | Admitting: Emergency Medicine

## 2013-12-31 DIAGNOSIS — R519 Headache, unspecified: Secondary | ICD-10-CM

## 2013-12-31 DIAGNOSIS — R1013 Epigastric pain: Secondary | ICD-10-CM | POA: Insufficient documentation

## 2013-12-31 DIAGNOSIS — R110 Nausea: Secondary | ICD-10-CM | POA: Insufficient documentation

## 2013-12-31 DIAGNOSIS — Z8679 Personal history of other diseases of the circulatory system: Secondary | ICD-10-CM | POA: Insufficient documentation

## 2013-12-31 DIAGNOSIS — Z79899 Other long term (current) drug therapy: Secondary | ICD-10-CM | POA: Insufficient documentation

## 2013-12-31 DIAGNOSIS — Z880 Allergy status to penicillin: Secondary | ICD-10-CM | POA: Insufficient documentation

## 2013-12-31 DIAGNOSIS — R630 Anorexia: Secondary | ICD-10-CM | POA: Insufficient documentation

## 2013-12-31 DIAGNOSIS — R51 Headache: Secondary | ICD-10-CM | POA: Insufficient documentation

## 2013-12-31 LAB — URINALYSIS, ROUTINE W REFLEX MICROSCOPIC
Bilirubin Urine: NEGATIVE
Glucose, UA: NEGATIVE mg/dL
Ketones, ur: NEGATIVE mg/dL
Leukocytes, UA: NEGATIVE
Nitrite: NEGATIVE
Protein, ur: NEGATIVE mg/dL
Specific Gravity, Urine: 1.02 (ref 1.005–1.030)
Urobilinogen, UA: 0.2 mg/dL (ref 0.0–1.0)
pH: 6 (ref 5.0–8.0)

## 2013-12-31 LAB — URINE MICROSCOPIC-ADD ON

## 2013-12-31 MED ORDER — SODIUM CHLORIDE 0.9 % IV BOLUS (SEPSIS)
1000.0000 mL | Freq: Once | INTRAVENOUS | Status: AC
  Administered 2013-12-31: 1000 mL via INTRAVENOUS

## 2013-12-31 MED ORDER — KETOROLAC TROMETHAMINE 30 MG/ML IJ SOLN
30.0000 mg | Freq: Once | INTRAMUSCULAR | Status: AC
  Administered 2013-12-31: 30 mg via INTRAVENOUS
  Filled 2013-12-31: qty 1

## 2013-12-31 MED ORDER — DIPHENHYDRAMINE HCL 50 MG/ML IJ SOLN
25.0000 mg | Freq: Once | INTRAMUSCULAR | Status: AC
  Administered 2013-12-31: 25 mg via INTRAVENOUS
  Filled 2013-12-31: qty 1

## 2013-12-31 MED ORDER — METOCLOPRAMIDE HCL 5 MG/ML IJ SOLN
10.0000 mg | Freq: Once | INTRAMUSCULAR | Status: AC
  Administered 2013-12-31: 10 mg via INTRAVENOUS
  Filled 2013-12-31: qty 2

## 2013-12-31 NOTE — ED Provider Notes (Signed)
CSN: [id number]     Arrival date & time 12/31/13  1818 History   First MD Initiated Contact with Patient 12/31/13 1846  This chart was scribed for Karla Razor, MD by Valera Castle, ED Scribe. This patient was seen in room APA11/APA11 and the patient's care was started at 6:55 PM.   Chief Complaint  Patient presents with  . Headache   (Consider location/radiation/quality/duration/timing/severity/associated sxs/prior Treatment) The history is provided by the patient. No language interpreter was used.  HPI Comments: Karla Ward is a 43 y.o. female who presents to the Emergency Department complaining of constant headache, onset yesterday after waking up. She states usually with Ibuprofen she has relief, but after taking Ibuprofen and Aleve, she has not had relief. She reports head movement and light exacerbates her pain. She reports associated pain and pressure behind her eyes. She also reports associated nausea, decreased appetite, and constant, aching, upper abdominal pain. She denies any bad taste in her mouth. She states she has been more thirsty than usual, has been drinking a lot, but denies urinary frequency. She denies rhinorrhea, cough, SOB, recent falls, neck pain, neck stiffness, fever, chills, urinary symptoms, and any other associated symptoms. She had BP checked recently at 172/105. She reports being told she has HTN in the past, but denies being put on any medications.   History reviewed. No pertinent past medical history. Past Surgical History  Procedure Laterality Date  . Rapid heart rate     History reviewed. No pertinent family history. History  Substance Use Topics  . Smoking status: Never Smoker   . Smokeless tobacco: Never Used  . Alcohol Use: No   OB History   Grav Para Term Preterm Abortions TAB SAB Ect Mult Living                 Review of Systems  All other systems reviewed and are negative.   Allergies  Penicillins  Home Medications   Current  Outpatient Rx  Name  Route  Sig  Dispense  Refill  . ibuprofen (ADVIL,MOTRIN) 800 MG tablet   Oral   Take 800 mg by mouth 3 (three) times daily as needed. For pain         . loratadine (CLARITIN) 10 MG tablet   Oral   Take 10 mg by mouth as needed for allergies.          Marland Kitchen LORazepam (ATIVAN) 1 MG tablet   Oral   Take 1 mg by mouth daily as needed. *To be taken at the first sign of a muscle tension headache         . medroxyPROGESTERone (DEPO-PROVERA) 150 MG/ML injection   Intramuscular   Inject 150 mg into the muscle every 3 (three) months.         . ondansetron (ZOFRAN) 4 MG tablet   Oral   Take 1 tablet (4 mg total) by mouth every 6 (six) hours.   12 tablet   0   . propranolol (INDERAL) 10 MG tablet   Oral   Take 1 tablet (10 mg total) by mouth as needed.   30 tablet   3    BP 168/76  Pulse 74  Temp(Src) 98.7 F (37.1 C) (Oral)  Resp 18  Ht 5\' 4"  (1.626 m)  Wt 186 lb 2 oz (84.426 kg)  BMI 31.93 kg/m2  SpO2 100%  Physical Exam  Nursing note and vitals reviewed. Constitutional: She is oriented to person, place, and time. She appears well-developed  and well-nourished. No distress.  HENT:  Head: Normocephalic and atraumatic.  Eyes: EOM are normal.  Neck: Normal range of motion. Neck supple. No rigidity.  Cardiovascular: Normal rate, regular rhythm and normal heart sounds.   No murmur heard. Pulmonary/Chest: Effort normal and breath sounds normal. No respiratory distress. She has no wheezes. She has no rales.  Abdominal: Soft. There is tenderness (minimal epigastric tenderness). There is no rebound and no guarding.  Musculoskeletal: Normal range of motion.  Neurological: She is alert and oriented to person, place, and time. She has normal strength. No cranial nerve deficit or sensory deficit.  Skin: Skin is warm and dry.  Psychiatric: She has a normal mood and affect. Her behavior is normal.   ED Course  Procedures (including critical care  time)  DIAGNOSTIC STUDIES: Oxygen Saturation is 100% on room air, normal by my interpretation.    COORDINATION OF CARE: 7:03 PM-Discussed treatment plan which includes UA with pt at bedside and pt agreed to plan.   Results for orders placed during the hospital encounter of 12/31/13  URINALYSIS, ROUTINE W REFLEX MICROSCOPIC      Result Value Ref Range   Color, Urine YELLOW  YELLOW   APPearance CLEAR  CLEAR   Specific Gravity, Urine 1.020  1.005 - 1.030   pH 6.0  5.0 - 8.0   Glucose, UA NEGATIVE  NEGATIVE mg/dL   Hgb urine dipstick MODERATE (*) NEGATIVE   Bilirubin Urine NEGATIVE  NEGATIVE   Ketones, ur NEGATIVE  NEGATIVE mg/dL   Protein, ur NEGATIVE  NEGATIVE mg/dL   Urobilinogen, UA 0.2  0.0 - 1.0 mg/dL   Nitrite NEGATIVE  NEGATIVE   Leukocytes, UA NEGATIVE  NEGATIVE  URINE MICROSCOPIC-ADD ON      Result Value Ref Range   Squamous Epithelial / LPF FEW (*) RARE   WBC, UA 0-2  <3 WBC/hpf   RBC / HPF 7-10  <3 RBC/hpf   Bacteria, UA MANY (*) RARE   No results found.   EKG Interpretation None     Medications  metoCLOPramide (REGLAN) injection 10 mg (10 mg Intravenous Given 12/31/13 1937)  diphenhydrAMINE (BENADRYL) injection 25 mg (25 mg Intravenous Given 12/31/13 1937)  ketorolac (TORADOL) 30 MG/ML injection 30 mg (30 mg Intravenous Given 12/31/13 1937)  sodium chloride 0.9 % bolus 1,000 mL (1,000 mLs Intravenous New Bag/Given 12/31/13 1930)    MDM   Final diagnoses:  Headache    43yf with headache. Suspect primary HA. Consider emergent secondary causes such as bleed, infectious or mass but doubt. There is no history of trauma. Pt has a nonfocal neurological exam. Afebrile and neck supple. No use of blood thinning medication. Consider ocular etiology such as acute angle closure glaucoma but doubt. Pt denies acute change in visual acuity and eye exam unremarkable. Doubt temporal arteritis given age, no temporal tenderness and temporal artery pulsations palpable. Doubt CO  poisoning. No contacts with similar symptoms. Doubt venous thrombosis. Doubt carotid or vertebral arteries dissection. Symptoms improved with meds. Feel that can be safely discharged, but strict return precautions discussed. Outpt fu.  I personally preformed the services scribed in my presence. The recorded information has been reviewed is accurate. Karla RazorStephen Holdan Stucke, MD.   Karla RazorStephen Nani Ingram, MD 01/03/14 [phone]

## 2013-12-31 NOTE — Discharge Instructions (Signed)

## 2013-12-31 NOTE — ED Notes (Signed)
Headache for 2 days, no injury. Took motrin and tylenol without relief.  Had bp checked and it was 172/105,

## 2014-01-09 ENCOUNTER — Other Ambulatory Visit (HOSPITAL_COMMUNITY): Payer: Self-pay | Admitting: *Deleted

## 2014-01-09 DIAGNOSIS — N6489 Other specified disorders of breast: Secondary | ICD-10-CM

## 2014-01-09 DIAGNOSIS — Z09 Encounter for follow-up examination after completed treatment for conditions other than malignant neoplasm: Secondary | ICD-10-CM

## 2014-01-22 ENCOUNTER — Ambulatory Visit (HOSPITAL_COMMUNITY)
Admission: RE | Admit: 2014-01-22 | Discharge: 2014-01-22 | Disposition: A | Discharge status: Home / Self Care | Payer: Managed Care, Other (non HMO) | Source: Ambulatory Visit | Attending: *Deleted | Admitting: *Deleted

## 2014-01-22 DIAGNOSIS — Z09 Encounter for follow-up examination after completed treatment for conditions other than malignant neoplasm: Secondary | ICD-10-CM

## 2014-01-22 DIAGNOSIS — N6489 Other specified disorders of breast: Secondary | ICD-10-CM | POA: Insufficient documentation

## 2014-02-12 NOTE — Telephone Encounter (Signed)
error 

## 2014-04-16 ENCOUNTER — Other Ambulatory Visit (HOSPITAL_COMMUNITY): Payer: Self-pay | Admitting: *Deleted

## 2014-04-16 DIAGNOSIS — Z1231 Encounter for screening mammogram for malignant neoplasm of breast: Secondary | ICD-10-CM

## 2014-05-12 ENCOUNTER — Ambulatory Visit (HOSPITAL_COMMUNITY)
Admission: RE | Admit: 2014-05-12 | Discharge: 2014-05-12 | Disposition: A | Discharge status: Home / Self Care | Payer: Managed Care, Other (non HMO) | Source: Ambulatory Visit | Attending: *Deleted | Admitting: *Deleted

## 2014-05-12 DIAGNOSIS — Z1231 Encounter for screening mammogram for malignant neoplasm of breast: Secondary | ICD-10-CM | POA: Insufficient documentation

## 2014-12-26 ENCOUNTER — Emergency Department (HOSPITAL_COMMUNITY)
Admission: EM | Admit: 2014-12-26 | Discharge: 2014-12-26 | Disposition: A | Discharge status: Home / Self Care | Payer: Managed Care, Other (non HMO) | Attending: Emergency Medicine | Admitting: Emergency Medicine

## 2014-12-26 ENCOUNTER — Encounter (HOSPITAL_COMMUNITY): Payer: Self-pay

## 2014-12-26 DIAGNOSIS — Z79899 Other long term (current) drug therapy: Secondary | ICD-10-CM | POA: Diagnosis not present

## 2014-12-26 DIAGNOSIS — Z791 Long term (current) use of non-steroidal anti-inflammatories (NSAID): Secondary | ICD-10-CM | POA: Diagnosis not present

## 2014-12-26 DIAGNOSIS — J018 Other acute sinusitis: Secondary | ICD-10-CM

## 2014-12-26 DIAGNOSIS — Z880 Allergy status to penicillin: Secondary | ICD-10-CM | POA: Insufficient documentation

## 2014-12-26 DIAGNOSIS — J329 Chronic sinusitis, unspecified: Secondary | ICD-10-CM | POA: Diagnosis not present

## 2014-12-26 DIAGNOSIS — Z3202 Encounter for pregnancy test, result negative: Secondary | ICD-10-CM | POA: Diagnosis not present

## 2014-12-26 DIAGNOSIS — I10 Essential (primary) hypertension: Secondary | ICD-10-CM | POA: Diagnosis not present

## 2014-12-26 DIAGNOSIS — H8110 Benign paroxysmal vertigo, unspecified ear: Secondary | ICD-10-CM | POA: Insufficient documentation

## 2014-12-26 DIAGNOSIS — R42 Dizziness and giddiness: Secondary | ICD-10-CM | POA: Diagnosis present

## 2014-12-26 HISTORY — DX: Essential (primary) hypertension: I10

## 2014-12-26 LAB — URINALYSIS, ROUTINE W REFLEX MICROSCOPIC
Bilirubin Urine: NEGATIVE
GLUCOSE, UA: NEGATIVE mg/dL
Ketones, ur: NEGATIVE mg/dL
LEUKOCYTES UA: NEGATIVE
NITRITE: NEGATIVE
PH: 6 (ref 5.0–8.0)
Protein, ur: NEGATIVE mg/dL
Specific Gravity, Urine: 1.01 (ref 1.005–1.030)
Urobilinogen, UA: 0.2 mg/dL (ref 0.0–1.0)

## 2014-12-26 LAB — POC URINE PREG, ED: Preg Test, Ur: NEGATIVE

## 2014-12-26 LAB — URINE MICROSCOPIC-ADD ON

## 2014-12-26 MED ORDER — OXYMETAZOLINE HCL 0.05 % NA SOLN: 2.0000 | Freq: Three times a day (TID) | NASAL | Status: DC

## 2014-12-26 MED ORDER — HYDROCODONE-ACETAMINOPHEN 5-325 MG PO TABS: 1.0000 | ORAL_TABLET | ORAL | Status: DC | PRN

## 2014-12-26 MED ORDER — MECLIZINE HCL 50 MG PO TABS: 50.0000 mg | ORAL_TABLET | Freq: Three times a day (TID) | ORAL | Status: DC | PRN

## 2014-12-26 MED ORDER — MECLIZINE HCL 12.5 MG PO TABS
25.0000 mg | ORAL_TABLET | Freq: Once | ORAL | Status: AC
  Administered 2014-12-26: 25 mg via ORAL
  Filled 2014-12-26: qty 2

## 2014-12-26 NOTE — ED Provider Notes (Signed)
CSN: [id number]     Arrival date & time 12/26/14  [id number] History   First MD Initiated Contact with Patient 12/26/14 1015     Chief Complaint  Patient presents with  . Dizziness     (Consider location/radiation/quality/duration/timing/severity/associated sxs/prior Treatment) HPI Comments: Patient is a 44 year old female who presents to the emergency department with a complaint of "dizziness".  Patient states that for the past 3 days she has had a sensation of fullness and pressure in her head and now both ears. This has been accompanied by dizziness. She describes the dizziness as mostly a sensation of being off balance and near falling, but occasionally it feels as though the room is spinning. This gives better when she sits still, it gets worse when she changes positions, particularly when she is lying down flat. The patient states she has not had any high fever. She's had some nasal congestion, but no epistaxis, no hemoptysis, no history of trauma to the head or face. No palpitations reported.  Patient is a 44 y.o. female presenting with dizziness. The history is provided by the patient.  Dizziness Associated symptoms: no blood in stool, no chest pain, no palpitations and no shortness of breath     Past Medical History  Diagnosis Date  . Hypertension    Past Surgical History  Procedure Laterality Date  . Rapid heart rate     No family history on file. History  Substance Use Topics  . Smoking status: Never Smoker   . Smokeless tobacco: Never Used  . Alcohol Use: No   OB History    No data available     Review of Systems  Constitutional: Negative for activity change.       All ROS Neg except as noted in HPI  HENT: Negative for nosebleeds.   Eyes: Negative for photophobia and discharge.  Respiratory: Negative for cough, shortness of breath and wheezing.   Cardiovascular: Negative for chest pain and palpitations.  Gastrointestinal: Negative for abdominal pain and blood in  stool.  Genitourinary: Negative for dysuria, frequency and hematuria.  Musculoskeletal: Negative for back pain, arthralgias and neck pain.  Skin: Negative.   Neurological: Positive for dizziness. Negative for seizures and speech difficulty.  Psychiatric/Behavioral: Negative for hallucinations and confusion.      Allergies  Benadryl and Penicillins  Home Medications   Prior to Admission medications   Medication Sig Start Date End Date Taking? Authorizing Provider  HYDROcodone-acetaminophen (NORCO/VICODIN) 5-325 MG per tablet Take 1 tablet by mouth every 4 (four) hours as needed. 12/26/14   Kathie DikeHobson M Victoriya Pol, PA-C  ibuprofen (ADVIL,MOTRIN) 800 MG tablet Take 800 mg by mouth 3 (three) times daily as needed. For pain 09/23/13   Historical Provider, MD  loratadine (CLARITIN) 10 MG tablet Take 10 mg by mouth as needed for allergies.     Historical Provider, MD  LORazepam (ATIVAN) 1 MG tablet Take 1 mg by mouth daily as needed. *To be taken at the first sign of a muscle tension headache    Historical Provider, MD  meclizine (ANTIVERT) 50 MG tablet Take 1 tablet (50 mg total) by mouth 3 (three) times daily as needed. 12/26/14   Kathie DikeHobson M Fiora Weill, PA-C  medroxyPROGESTERone (DEPO-PROVERA) 150 MG/ML injection Inject 150 mg into the muscle every 3 (three) months.    Historical Provider, MD  ondansetron (ZOFRAN) 4 MG tablet Take 1 tablet (4 mg total) by mouth every 6 (six) hours. 11/30/12   Kathie DikeHobson M Dandre Sisler, PA-C  oxymetazoline (AFRIN NASAL SPRAY)  0.05 % nasal spray Place 2 sprays into both nostrils every 8 (eight) hours. 12/26/14   Kathie Dike, PA-C  propranolol (INDERAL) 10 MG tablet Take 1 tablet (10 mg total) by mouth as needed. 12/20/12   Wendall Stade, MD   BP 130/94 mmHg  Pulse 84  Temp(Src) 98.6 F (37 C) (Oral)  Resp 16  Ht  (1.626 m)  Wt 160 lb (72.576 kg)  BMI 27.45 kg/m2  SpO2 100%  LMP 12/15/2014 Physical Exam  Constitutional: She is oriented to person, place, and time. She  appears well-developed and well-nourished.  Non-toxic appearance.  HENT:  Head: Normocephalic.  Right Ear: Tympanic membrane and external ear normal.  Left Ear: Tympanic membrane and external ear normal.  Nasal congestion present.  There is mild-to-moderate swelling of the tonsils bilaterally. No exudate noted. Uvula is in the midline. Airway is patent.  Eyes: EOM and lids are normal. Pupils are equal, round, and reactive to light.  Neck: Normal range of motion. Neck supple. Carotid bruit is not present.  Cardiovascular: Normal rate, regular rhythm, normal heart sounds, intact distal pulses and normal pulses.   Pulmonary/Chest: Breath sounds normal. No respiratory distress.  Abdominal: Soft. Bowel sounds are normal. There is no tenderness. There is no guarding.  Musculoskeletal: Normal range of motion.  Lymphadenopathy:       Head (right side): No submandibular adenopathy present.       Head (left side): No submandibular adenopathy present.    She has no cervical adenopathy.  Neurological: She is alert and oriented to person, place, and time. She has normal strength. No cranial nerve deficit or sensory deficit.  Skin: Skin is warm and dry.  Psychiatric: She has a normal mood and affect. Her speech is normal.  Nursing note and vitals reviewed.   ED Course  Procedures (including critical care time) Labs Review Labs Reviewed  URINALYSIS, ROUTINE W REFLEX MICROSCOPIC - Abnormal; Notable for the following:    Hgb urine dipstick MODERATE (*)    All other components within normal limits  URINE MICROSCOPIC-ADD ON - Abnormal; Notable for the following:    Squamous Epithelial / LPF FEW (*)    All other components within normal limits  POC URINE PREG, ED    Imaging Review No results found.   EKG Interpretation None      MDM  Examination suggests benign positional vertigo, and sinusitis with upper respiratory infection. The vital signs are within normal limits with exception of the  blood pressure being slightly elevated. Pulse oximetry is 100% on room air. Within normal limits by my interpretation.   The plan at this time is for the patient to be treated with Afrin spray, and Antivert, and Norco. Patient is advised to increase fluids. The patient is to see the primary physician or return to the emergency department if any changes or problems.    Final diagnoses:  Benign positional vertigo, unspecified laterality  Other subacute sinusitis    *I have reviewed nursing notes, vital signs, and all appropriate lab and imaging results for this patient.**    Kathie Dike, PA-C 12/26/14 1134  Kristen N Ward, DO 12/26/14 [phone]

## 2014-12-26 NOTE — Discharge Instructions (Signed)
Please use salt water gargles for swelling of the tonsils. Use tylenol or ibuprofen for fever and aching. Use afrin every 8 hours for congestion, but use for 5 DAYS ONLY. Stop use of afrin for 4 days and may resume for another 5 days if needed. Antivert three times daily for dizziness. Norco for headache or severe aching. Antivert and Norco may cause drowsiness, use with caution. Dizziness  Dizziness means you feel unsteady or lightheaded. You might feel like you are going to pass out (faint). HOME CARE   Drink enough fluids to keep your pee (urine) clear or pale yellow.  Take your medicines exactly as told by your doctor. If you take blood pressure medicine, always stand up slowly from the lying or sitting position. Hold on to something to steady yourself.  If you need to stand in one place for a long time, move your legs often. Tighten and relax your leg muscles.  Have someone stay with you until you feel okay.  Do not drive or use heavy machinery if you feel dizzy.  Do not drink alcohol. GET HELP RIGHT AWAY IF:   You feel dizzy or lightheaded and it gets worse.  You feel sick to your stomach (nauseous), or you throw up (vomit).  You have trouble talking or walking.  You feel weak or have trouble using your arms, hands, or legs.  You cannot think clearly or have trouble forming sentences.  You have chest pain, belly (abdominal) pain, sweating, or you are short of breath.  Your vision changes.  You are bleeding.  You have problems from your medicine that seem to be getting worse. MAKE SURE YOU:   Understand these instructions.  Will watch your condition.  Will get help right away if you are not doing well or get worse. Document Released: 10/06/2011 Document Revised: 01/09/2012 Document Reviewed: 10/06/2011 [facility] Patient Information 2015 [place]. This information is not intended to replace advice given to you by your health care provider. Make sure you discuss  any questions you have with your health care provider.  Upper Respiratory Infection, Adult An upper respiratory infection (URI) is also known as the common cold. It is often caused by a type of germ (virus). Colds are easily spread (contagious). You can pass it to others by kissing, coughing, sneezing, or drinking out of the same glass. Usually, you get better in 1 or 2 weeks.  HOME CARE   Only take medicine as told by your doctor.  Use a warm mist humidifier or breathe in steam from a hot shower.  Drink enough water and fluids to keep your pee (urine) clear or pale yellow.  Get plenty of rest.  Return to work when your temperature is back to normal or as told by your doctor. You may use a face mask and wash your hands to stop your cold from spreading. GET HELP RIGHT AWAY IF:   After the first few days, you feel you are getting worse.  You have questions about your medicine.  You have chills, shortness of breath, or brown or red spit (mucus).  You have yellow or brown snot (nasal discharge) or pain in the face, especially when you bend forward.  You have a fever, puffy (swollen) neck, pain when you swallow, or white spots in the back of your throat.  You have a bad headache, ear pain, sinus pain, or chest pain.  You have a high-pitched whistling sound when you breathe in and out (wheezing).  You  have a lasting cough or cough up blood.  You have sore muscles or a stiff neck. MAKE SURE YOU:   Understand these instructions.  Will watch your condition.  Will get help right away if you are not doing well or get worse. Document Released: 04/04/2008 Document Revised: 01/09/2012 Document Reviewed: 01/22/2014 [facility] Patient Information 2015 [place], [place]. This information is not intended to replace advice given to you by your health care provider. Make sure you discuss any questions you have with your health care provider.

## 2014-12-26 NOTE — ED Notes (Signed)
Patient with no complaints at this time. Respirations even and unlabored. Skin warm/dry. Discharge instructions reviewed with patient at this time. Patient given opportunity to voice concerns/ask questions. Patient discharged at this time and left Emergency Department with steady gait.   

## 2014-12-26 NOTE — ED Notes (Signed)
Pt reports since Tuesday has felt pressure in head and both ears, and dizziness.  Reports vomited x 1 today.

## 2015-05-26 ENCOUNTER — Other Ambulatory Visit (HOSPITAL_COMMUNITY): Payer: Self-pay | Admitting: Family Medicine

## 2015-05-26 DIAGNOSIS — Z1231 Encounter for screening mammogram for malignant neoplasm of breast: Secondary | ICD-10-CM

## 2015-06-01 ENCOUNTER — Ambulatory Visit (HOSPITAL_COMMUNITY)
Admission: RE | Admit: 2015-06-01 | Discharge: 2015-06-01 | Disposition: A | Discharge status: Home / Self Care | Payer: Managed Care, Other (non HMO) | Source: Ambulatory Visit | Attending: Family Medicine | Admitting: Family Medicine

## 2015-06-01 DIAGNOSIS — Z1231 Encounter for screening mammogram for malignant neoplasm of breast: Secondary | ICD-10-CM | POA: Diagnosis present

## 2015-07-11 ENCOUNTER — Emergency Department (HOSPITAL_COMMUNITY)
Admission: EM | Admit: 2015-07-11 | Discharge: 2015-07-11 | Disposition: A | Discharge status: Home / Self Care | Payer: Managed Care, Other (non HMO) | Attending: Emergency Medicine | Admitting: Emergency Medicine

## 2015-07-11 ENCOUNTER — Emergency Department (HOSPITAL_COMMUNITY): Payer: Managed Care, Other (non HMO)

## 2015-07-11 ENCOUNTER — Encounter (HOSPITAL_COMMUNITY): Payer: Self-pay | Admitting: Emergency Medicine

## 2015-07-11 DIAGNOSIS — R112 Nausea with vomiting, unspecified: Secondary | ICD-10-CM | POA: Diagnosis not present

## 2015-07-11 DIAGNOSIS — I10 Essential (primary) hypertension: Secondary | ICD-10-CM | POA: Insufficient documentation

## 2015-07-11 DIAGNOSIS — Z3202 Encounter for pregnancy test, result negative: Secondary | ICD-10-CM | POA: Insufficient documentation

## 2015-07-11 DIAGNOSIS — R079 Chest pain, unspecified: Secondary | ICD-10-CM | POA: Diagnosis not present

## 2015-07-11 DIAGNOSIS — Z79899 Other long term (current) drug therapy: Secondary | ICD-10-CM | POA: Diagnosis not present

## 2015-07-11 DIAGNOSIS — R1013 Epigastric pain: Secondary | ICD-10-CM | POA: Diagnosis present

## 2015-07-11 LAB — URINE MICROSCOPIC-ADD ON

## 2015-07-11 LAB — URINALYSIS, ROUTINE W REFLEX MICROSCOPIC
Bilirubin Urine: NEGATIVE
Glucose, UA: NEGATIVE mg/dL
Ketones, ur: NEGATIVE mg/dL
NITRITE: NEGATIVE
PH: 7 (ref 5.0–8.0)
Protein, ur: NEGATIVE mg/dL
SPECIFIC GRAVITY, URINE: 1.021 (ref 1.005–1.030)
UROBILINOGEN UA: 0.2 mg/dL (ref 0.0–1.0)

## 2015-07-11 LAB — CBC
HEMATOCRIT: 41 % (ref 36.0–46.0)
HEMOGLOBIN: 13.7 g/dL (ref 12.0–15.0)
MCH: 29.7 pg (ref 26.0–34.0)
MCHC: 33.4 g/dL (ref 30.0–36.0)
MCV: 88.7 fL (ref 78.0–100.0)
Platelets: 315 10*3/uL (ref 150–400)
RBC: 4.62 MIL/uL (ref 3.87–5.11)
RDW: 13.4 % (ref 11.5–15.5)
WBC: 10.4 10*3/uL (ref 4.0–10.5)

## 2015-07-11 LAB — BASIC METABOLIC PANEL
ANION GAP: 6 (ref 5–15)
BUN: 7 mg/dL (ref 6–20)
CALCIUM: 9.2 mg/dL (ref 8.9–10.3)
CHLORIDE: 111 mmol/L (ref 101–111)
CO2: 24 mmol/L (ref 22–32)
Creatinine, Ser: 0.99 mg/dL (ref 0.44–1.00)
GFR calc Af Amer: >60 mL/min (ref >60)
GFR calc non Af Amer: >60 mL/min (ref >60)
Glucose, Bld: 88 mg/dL (ref 65–99)
POTASSIUM: 4.2 mmol/L (ref 3.5–5.1)
Sodium: 141 mmol/L (ref 135–145)

## 2015-07-11 LAB — TROPONIN I: Troponin I: <0.03 ng/mL (ref <0.031)

## 2015-07-11 LAB — POC URINE PREG, ED
PREG TEST UR: NEGATIVE
PREG TEST UR: NEGATIVE

## 2015-07-11 LAB — LIPASE, BLOOD: Lipase: 18 U/L — ABNORMAL LOW (ref 22–51)

## 2015-07-11 MED ORDER — SUCRALFATE 1 GM/10ML PO SUSP: 1.0000 g | Freq: Three times a day (TID) | ORAL | Status: DC

## 2015-07-11 MED ORDER — GI COCKTAIL ~~LOC~~
30.0000 mL | Freq: Once | ORAL | Status: AC
  Administered 2015-07-11: 30 mL via ORAL
  Filled 2015-07-11: qty 30

## 2015-07-11 MED ORDER — OMEPRAZOLE 20 MG PO CPDR: 20.0000 mg | DELAYED_RELEASE_CAPSULE | Freq: Every day | ORAL | Status: DC

## 2015-07-11 NOTE — ED Notes (Addendum)
Offered patient Zofran however, she declines, warm blanket offered pt declines as well. Pt only c/o generalized abdominal pain currently.

## 2015-07-11 NOTE — Discharge Instructions (Signed)
Abdominal Pain °Many things can cause abdominal pain. Usually, abdominal pain is not caused by a disease and will improve without treatment. It can often be observed and treated at home. Your health care provider will do a physical exam and possibly order blood tests and X-rays to help determine the seriousness of your pain. However, in many cases, more time must pass before a clear cause of the pain can be found. Before that point, your health care provider may not know if you need more testing or further treatment. °HOME CARE INSTRUCTIONS  °Monitor your abdominal pain for any changes. The following actions may help to alleviate any discomfort you are experiencing: °· Only take over-the-counter or prescription medicines as directed by your health care provider. °· Do not take laxatives unless directed to do so by your health care provider. °· Try a clear liquid diet (broth, tea, or water) as directed by your health care provider. Slowly move to a bland diet as tolerated. °SEEK MEDICAL CARE IF: °· You have unexplained abdominal pain. °· You have abdominal pain associated with nausea or diarrhea. °· You have pain when you urinate or have a bowel movement. °· You experience abdominal pain that wakes you in the night. °· You have abdominal pain that is worsened or improved by eating food. °· You have abdominal pain that is worsened with eating fatty foods. °· You have a fever. °SEEK IMMEDIATE MEDICAL CARE IF:  °· Your pain does not go away within 2 hours. °· You keep throwing up (vomiting). °· Your pain is felt only in portions of the abdomen, such as the right side or the left lower portion of the abdomen. °· You pass bloody or black tarry stools. °MAKE SURE YOU: °· Understand these instructions.   °· Will watch your condition.   °· Will get help right away if you are not doing well or get worse.   °Document Released: 07/27/2005 Document Revised: 10/22/2013 Document Reviewed: 06/26/2013 °ExitCare® Patient Information  ©2015 [employer]. This information is not intended to replace advice given to you by your health care provider. Make sure you discuss any questions you have with your health care provider. °Food Choices for Peptic Ulcer Disease °When you have peptic ulcer disease, the foods you eat and your eating habits are very important. Choosing the right foods can help ease the discomfort of peptic ulcer disease. °WHAT GENERAL GUIDELINES DO I NEED TO FOLLOW? °· Choose fruits, vegetables, whole grains, and low-fat meat, fish, and poultry.   °· Keep a food diary to identify foods that cause symptoms. °· Avoid foods that cause irritation or pain. These may be different for different people. °· Eat frequent small meals instead of three large meals each day. The pain may be worse when your stomach is empty.  °· Avoid eating close to bedtime. °WHAT FOODS ARE NOT RECOMMENDED? °The following are some foods and drinks that may worsen your symptoms: °· Black, white, and red pepper. °· Hot sauce. °· Chili peppers. °· Chili powder. °· Chocolate and cocoa.    °· Alcohol. °· Tea, coffee, and cola (regular and decaffeinated). °The items listed above may not be a complete list of foods and beverages to avoid. Contact your dietitian for more information. °Document Released: 01/09/2012 Document Revised: 10/22/2013 Document Reviewed: 08/21/2013 °ExitCare® Patient Information ©2015 [employer]. This information is not intended to replace advice given to you by your health care provider. Make sure you discuss any questions you have with your health care provider. °Peptic Ulcer °A peptic ulcer is   a sore in the lining of your esophagus (esophageal ulcer), stomach (gastric ulcer), or in the first part of your small intestine (duodenal ulcer). The ulcer causes erosion into the deeper tissue. °CAUSES  °Normally, the lining of the stomach and the small intestine protects itself from the acid that digests food. The protective lining can be damaged  by: °· An infection caused by a bacterium called Helicobacter pylori (H. pylori). °· Regular use of nonsteroidal anti-inflammatory drugs (NSAIDs), such as ibuprofen or aspirin. °· Smoking tobacco. °Other risk factors include being older than 50, drinking alcohol excessively, and having a family history of ulcer disease.  °SYMPTOMS  °· Burning pain or gnawing in the area between the chest and the belly button. °· Heartburn. °· Nausea and vomiting. °· Bloating. °The pain can be worse on an empty stomach and at night. If the ulcer results in bleeding, it can cause: °· Black, tarry stools. °· Vomiting of bright red blood. °· Vomiting of coffee-ground-looking materials. °DIAGNOSIS  °A diagnosis is usually made based upon your history and an exam. Other tests and procedures may be performed to find the cause of the ulcer. Finding a cause will help determine the best treatment. Tests and procedures may include: °· Blood tests, stool tests, or breath tests to check for the bacterium H. pylori. °· An upper gastrointestinal (GI) series of the esophagus, stomach, and small intestine. °· An endoscopy to examine the esophagus, stomach, and small intestine. °· A biopsy. °TREATMENT  °Treatment may include: °· Eliminating the cause of the ulcer, such as smoking, NSAIDs, or alcohol. °· Medicines to reduce the amount of acid in your digestive tract. °· Antibiotic medicines if the ulcer is caused by the H. pylori bacterium. °· An upper endoscopy to treat a bleeding ulcer. °· Surgery if the bleeding is severe or if the ulcer created a hole somewhere in the digestive system. °HOME CARE INSTRUCTIONS  °· Avoid tobacco, alcohol, and caffeine. Smoking can increase the acid in the stomach, and continued smoking will impair the healing of ulcers. °· Avoid foods and drinks that seem to cause discomfort or aggravate your ulcer. °· Only take medicines as directed by your caregiver. Do not substitute over-the-counter medicines for prescription  medicines without talking to your caregiver. °· Keep any follow-up appointments and tests as directed. °SEEK MEDICAL CARE IF:  °· Your do not improve within 7 days of starting treatment. °· You have ongoing indigestion or heartburn. °SEEK IMMEDIATE MEDICAL CARE IF:  °· You have sudden, sharp, or persistent abdominal pain. °· You have bloody or dark black, tarry stools. °· You vomit blood or vomit that looks like coffee grounds. °· You become light-headed, weak, or feel faint. °· You become sweaty or clammy. °MAKE SURE YOU:  °· Understand these instructions. °· Will watch your condition. °· Will get help right away if you are not doing well or get worse. °Document Released: 10/14/2000 Document Revised: 03/03/2014 Document Reviewed: 05/16/2012 °ExitCare® Patient Information ©2015 [employer]. This information is not intended to replace advice given to you by your health care provider. Make sure you discuss any questions you have with your health care provider. ° °

## 2015-07-11 NOTE — ED Provider Notes (Signed)
CSN: [id number]     Arrival date & time 07/11/15  1520 History   First MD Initiated Contact with Patient 07/11/15 2127     Chief Complaint  Patient presents with  . Nausea  . Emesis  . Chest Pain  . Abdominal Pain     (Consider location/radiation/quality/duration/timing/severity/associated sxs/prior Treatment) HPI Comments: Patient presents to the emergency department with chief complaint of epigastric abdominal pain. She states that she has been having the symptoms intermittently for the past several days. She is unable to associate it with eating or drinking. She describes it as a nagging sensation in her epigastrium. She has tried taking Pepto-Bismol with good relief. However she states that the symptoms returned. She states that she has had some associated nausea, vomiting, and some chest pain. She denies any fevers, or chills. She denies any prior abdominal surgeries.  The history is provided by the patient. No language interpreter was used.    Past Medical History  Diagnosis Date  . Hypertension    Past Surgical History  Procedure Laterality Date  . Rapid heart rate     History reviewed. No pertinent family history. Social History  Substance Use Topics  . Smoking status: Never Smoker   . Smokeless tobacco: Never Used  . Alcohol Use: No   OB History    No data available     Review of Systems  Constitutional: Negative for fever and chills.  Respiratory: Negative for shortness of breath.   Cardiovascular: Negative for chest pain.  Gastrointestinal: Positive for abdominal pain. Negative for nausea, vomiting, diarrhea and constipation.  Genitourinary: Negative for dysuria.      Allergies  Benadryl and Penicillins  Home Medications   Prior to Admission medications   Medication Sig Start Date End Date Taking? Authorizing Provider  amLODipine (NORVASC) 5 MG tablet Take 5 mg by mouth daily.   Yes Historical Provider, MD  meclizine (ANTIVERT) 50 MG tablet Take 1  tablet (50 mg total) by mouth 3 (three) times daily as needed. Patient taking differently: Take 50 mg by mouth 3 (three) times daily as needed for dizziness.  12/26/14  Yes Ivery Quale, PA-C  medroxyPROGESTERone (DEPO-PROVERA) 150 MG/ML injection Inject 150 mg into the muscle every 3 (three) months.   Yes Historical Provider, MD  ondansetron (ZOFRAN) 4 MG tablet Take 1 tablet (4 mg total) by mouth every 6 (six) hours. Patient taking differently: Take 4 mg by mouth 2 (two) times daily as needed for nausea or vomiting.  11/30/12  Yes Ivery Quale, PA-C  oxymetazoline (AFRIN NASAL SPRAY) 0.05 % nasal spray Place 2 sprays into both nostrils every 8 (eight) hours. Patient not taking: Reported on 07/11/2015 12/26/14   Ivery Quale, PA-C  propranolol (INDERAL) 10 MG tablet Take 1 tablet (10 mg total) by mouth as needed. Patient not taking: Reported on 07/11/2015 12/20/12   Wendall Stade, MD   BP 172/85 mmHg  Pulse 79  Temp(Src) 98.1 F (36.7 C) (Oral)  Resp 20  SpO2 100% Physical Exam  Constitutional: She is oriented to person, place, and time. She appears well-developed and well-nourished.  HENT:  Head: Normocephalic and atraumatic.  Eyes: Conjunctivae and EOM are normal. Pupils are equal, round, and reactive to light.  Neck: Normal range of motion. Neck supple.  Cardiovascular: Normal rate and regular rhythm.  Exam reveals no gallop and no friction rub.   No murmur heard. Pulmonary/Chest: Effort normal and breath sounds normal. No respiratory distress. She has no wheezes. She has no  rales. She exhibits no tenderness.  Abdominal: Soft. Bowel sounds are normal. She exhibits no distension and no mass. There is no tenderness. There is no rebound and no guarding.  No focal abdominal tenderness, no RLQ tenderness or pain at McBurney's point, no RUQ tenderness or Murphy's sign, no left-sided abdominal tenderness, no fluid wave, or signs of peritonitis   Musculoskeletal: Normal range of motion. She  exhibits no edema or tenderness.  Neurological: She is alert and oriented to person, place, and time.  Skin: Skin is warm and dry.  Psychiatric: She has a normal mood and affect. Her behavior is normal. Judgment and thought content normal.  Nursing note and vitals reviewed.   ED Course  Procedures (including critical care time) Results for orders placed or performed during the hospital encounter of 07/11/15  Basic metabolic panel  Result Value Ref Range   Sodium 141 135 - 145 mmol/L   Potassium 4.2 3.5 - 5.1 mmol/L   Chloride 111 101 - 111 mmol/L   CO2 24 22 - 32 mmol/L   Glucose, Bld 88 65 - 99 mg/dL   BUN 7 6 - 20 mg/dL   Creatinine, Ser 4.09 0.44 - 1.00 mg/dL   Calcium 9.2 8.9 - 81.1 mg/dL   GFR calc non Af Amer >60 >60 mL/min   GFR calc Af Amer >60 >60 mL/min   Anion gap 6 5 - 15  CBC  Result Value Ref Range   WBC 10.4 4.0 - 10.5 K/uL   RBC 4.62 3.87 - 5.11 MIL/uL   Hemoglobin 13.7 12.0 - 15.0 g/dL   HCT 91.4 78.2 - 95.6 %   MCV 88.7 78.0 - 100.0 fL   MCH 29.7 26.0 - 34.0 pg   MCHC 33.4 30.0 - 36.0 g/dL   RDW 21.3 08.6 - 57.8 %   Platelets 315 150 - 400 K/uL  Troponin I  Result Value Ref Range   Troponin I <0.03 <0.031 ng/mL  Lipase, blood  Result Value Ref Range   Lipase 18 (L) 22 - 51 U/L  POC urine preg, ED (not at [facility])  Result Value Ref Range   Preg Test, Ur NEGATIVE NEGATIVE  POC urine preg, ED (not at [facility])  Result Value Ref Range   Preg Test, Ur NEGATIVE NEGATIVE   Dg Chest 2 View  07/11/2015   CLINICAL DATA:  Shortness of breath and chest pain  EXAM: CHEST  2 VIEW  COMPARISON:  10/08/2014  FINDINGS: The heart size and mediastinal contours are within normal limits. Both lungs are clear. The visualized skeletal structures are unremarkable.  IMPRESSION: No active cardiopulmonary disease.   Electronically Signed   By: Christiana Pellant M.D.   On: 07/11/2015 16:30     Imaging Review Dg Chest 2 View  07/11/2015   CLINICAL DATA:  Shortness of breath and chest  pain  EXAM: CHEST  2 VIEW  COMPARISON:  10/08/2014  FINDINGS: The heart size and mediastinal contours are within normal limits. Both lungs are clear. The visualized skeletal structures are unremarkable.  IMPRESSION: No active cardiopulmonary disease.   Electronically Signed   By: Christiana Pellant M.D.   On: 07/11/2015 16:30   I have personally reviewed and evaluated these images and lab results as part of my medical decision-making.   EKG Interpretation None      MDM   Final diagnoses:  Epigastric abdominal pain    Patient with epigastric abdominal pain. The pain is described as nagging. Resolves with Pepto-Bismol. Symptoms seem  consistent with peptic ulcer disease. Laboratory workup is unremarkable. Lipase is no elevated, no leukocytosis, no electrolyte abnormalities, no right upper quadrant abdominal pain to suggest cholecystitis or gallbladder colic, no lower abdominal tenderness, no dysuria. I suspect that this is PUD. Will treat with Carafate and omeprazole. Patient is very well-appearing. She is not in any apparent distress. She understands and agrees with the plan. Abdominal return precautions have been given. Patient is stable and ready for discharge.  Patient instructed to return for:  New or worsening symptoms, including, increased abdominal pain, especially pain that localizes to one side, bloody vomit, bloody diarrhea, fever >101, and intractable vomiting.     Roxy Horseman, PA-C 07/11/15 2212  Laurence Spates, MD 07/11/15 [phone]

## 2015-07-11 NOTE — ED Notes (Signed)
Pt c/o chest heaviness, and feeling as if heart is racing. Pt also c/o abd pain. Pt also c/o n/v for the past couple days.

## 2015-07-13 ENCOUNTER — Emergency Department (HOSPITAL_COMMUNITY)
Admission: EM | Admit: 2015-07-13 | Discharge: 2015-07-14 | Disposition: A | Discharge status: Home / Self Care | Payer: Managed Care, Other (non HMO) | Attending: Emergency Medicine | Admitting: Emergency Medicine

## 2015-07-13 ENCOUNTER — Encounter (HOSPITAL_COMMUNITY): Payer: Self-pay | Admitting: Emergency Medicine

## 2015-07-13 ENCOUNTER — Emergency Department (HOSPITAL_COMMUNITY): Payer: Managed Care, Other (non HMO)

## 2015-07-13 DIAGNOSIS — K219 Gastro-esophageal reflux disease without esophagitis: Secondary | ICD-10-CM | POA: Diagnosis not present

## 2015-07-13 DIAGNOSIS — I10 Essential (primary) hypertension: Secondary | ICD-10-CM | POA: Insufficient documentation

## 2015-07-13 DIAGNOSIS — Z880 Allergy status to penicillin: Secondary | ICD-10-CM | POA: Insufficient documentation

## 2015-07-13 DIAGNOSIS — Z79899 Other long term (current) drug therapy: Secondary | ICD-10-CM | POA: Diagnosis not present

## 2015-07-13 DIAGNOSIS — R079 Chest pain, unspecified: Secondary | ICD-10-CM | POA: Diagnosis present

## 2015-07-13 LAB — COMPREHENSIVE METABOLIC PANEL
ALT: 12 U/L — AB (ref 14–54)
AST: 18 U/L (ref 15–41)
Albumin: 4.5 g/dL (ref 3.5–5.0)
Alkaline Phosphatase: 54 U/L (ref 38–126)
Anion gap: 8 (ref 5–15)
BILIRUBIN TOTAL: 0.7 mg/dL (ref 0.3–1.2)
BUN: 9 mg/dL (ref 6–20)
CHLORIDE: 110 mmol/L (ref 101–111)
CO2: 20 mmol/L — ABNORMAL LOW (ref 22–32)
CREATININE: 1.08 mg/dL — AB (ref 0.44–1.00)
Calcium: 8.9 mg/dL (ref 8.9–10.3)
Glucose, Bld: 82 mg/dL (ref 65–99)
POTASSIUM: 3.5 mmol/L (ref 3.5–5.1)
Sodium: 138 mmol/L (ref 135–145)
Total Protein: 8.4 g/dL — ABNORMAL HIGH (ref 6.5–8.1)

## 2015-07-13 LAB — CBC
HEMATOCRIT: 41.9 % (ref 36.0–46.0)
Hemoglobin: 14.2 g/dL (ref 12.0–15.0)
MCH: 29.8 pg (ref 26.0–34.0)
MCHC: 33.9 g/dL (ref 30.0–36.0)
MCV: 88 fL (ref 78.0–100.0)
Platelets: 304 10*3/uL (ref 150–400)
RBC: 4.76 MIL/uL (ref 3.87–5.11)
RDW: 13.2 % (ref 11.5–15.5)
WBC: 10 10*3/uL (ref 4.0–10.5)

## 2015-07-13 LAB — TROPONIN I

## 2015-07-13 MED ORDER — ALUM & MAG HYDROXIDE-SIMETH 200-200-20 MG/5ML PO SUSP
30.0000 mL | Freq: Once | ORAL | Status: AC
  Administered 2015-07-13: 30 mL via ORAL
  Filled 2015-07-13: qty 30

## 2015-07-13 MED ORDER — FAMOTIDINE 20 MG PO TABS
20.0000 mg | ORAL_TABLET | Freq: Once | ORAL | Status: AC
  Administered 2015-07-14: 20 mg via ORAL
  Filled 2015-07-13: qty 1

## 2015-07-13 NOTE — ED Notes (Signed)
Patient ambulatory to restroom  ?

## 2015-07-13 NOTE — Discharge Instructions (Signed)
Continue taking omeprazole (Prilosec) and sucralfate but this is Carafate). For the next week, take either ranitidine (Zantac) or famotidine (Pepcid) twice a day. You may take an antacid as needed. If symptoms are not improving, talk with your physician about referral to a gastroenterologist.  Gastroesophageal Reflux Disease, Adult Gastroesophageal reflux disease (GERD) happens when acid from your stomach flows up into the esophagus. When acid comes in contact with the esophagus, the acid causes soreness (inflammation) in the esophagus. Over time, GERD may create small holes (ulcers) in the lining of the esophagus. CAUSES   Increased body weight. This puts pressure on the stomach, making acid rise from the stomach into the esophagus.  Smoking. This increases acid production in the stomach.  Drinking alcohol. This causes decreased pressure in the lower esophageal sphincter (valve or ring of muscle between the esophagus and stomach), allowing acid from the stomach into the esophagus.  Late evening meals and a full stomach. This increases pressure and acid production in the stomach.  A malformed lower esophageal sphincter. Sometimes, no cause is found. SYMPTOMS   Burning pain in the lower part of the mid-chest behind the breastbone and in the mid-stomach area. This may occur twice a week or more often.  Trouble swallowing.  Sore throat.  Dry cough.  Asthma-like symptoms including chest tightness, shortness of breath, or wheezing. DIAGNOSIS  Your caregiver may be able to diagnose GERD based on your symptoms. In some cases, X-rays and other tests may be done to check for complications or to check the condition of your stomach and esophagus. TREATMENT  Your caregiver may recommend over-the-counter or prescription medicines to help decrease acid production. Ask your caregiver before starting or adding any new medicines.  HOME CARE INSTRUCTIONS   Change the factors that you can control. Ask  your caregiver for guidance concerning weight loss, quitting smoking, and alcohol consumption.  Avoid foods and drinks that make your symptoms worse, such as:  Caffeine or alcoholic drinks.  Chocolate.  Peppermint or mint flavorings.  Garlic and onions.  Spicy foods.  Citrus fruits, such as oranges, lemons, or limes.  Tomato-based foods such as sauce, chili, salsa, and pizza.  Fried and fatty foods.  Avoid lying down for the 3 hours prior to your bedtime or prior to taking a nap.  Eat small, frequent meals instead of large meals.  Wear loose-fitting clothing. Do not wear anything tight around your waist that causes pressure on your stomach.  Raise the head of your bed 6 to 8 inches with wood blocks to help you sleep. Extra pillows will not help.  Only take over-the-counter or prescription medicines for pain, discomfort, or fever as directed by your caregiver.  Do not take aspirin, ibuprofen, or other nonsteroidal anti-inflammatory drugs (NSAIDs). SEEK IMMEDIATE MEDICAL CARE IF:   You have pain in your arms, neck, jaw, teeth, or back.  Your pain increases or changes in intensity or duration.  You develop nausea, vomiting, or sweating (diaphoresis).  You develop shortness of breath, or you faint.  Your vomit is green, yellow, black, or looks like coffee grounds or blood.  Your stool is red, bloody, or black. These symptoms could be signs of other problems, such as heart disease, gastric bleeding, or esophageal bleeding. MAKE SURE YOU:   Understand these instructions.  Will watch your condition.  Will get help right away if you are not doing well or get worse. Document Released: 07/27/2005 Document Revised: 01/09/2012 Document Reviewed: 05/06/2011 Exit[employer] Patient Information 2015 [employer]. This information is not intended to replace advice given to you by your health care provider. Make sure you discuss any questions you have with your health care  provider.

## 2015-07-13 NOTE — ED Notes (Signed)
Pt states she has been having chest pain - seen at [facility] and dx with epigastric abdo pain - pt has been having burning sensation and feels like it is harder to swallow sometimes -

## 2015-07-13 NOTE — ED Notes (Signed)
Patient states she was diagnosed with upper abdominal pain and prescribed reflux medication 2 days ago. Patient states she drank a soda this evening and is now having a burning sensation in her chest and throat. Patient requested not to have an IV at this time.

## 2015-07-13 NOTE — ED Provider Notes (Addendum)
CSN: [id number]     Arrival date & time 07/13/15  1730 History   First MD Initiated Contact with Patient 07/13/15 2318     Chief Complaint  Patient presents with  . Chest Pain     (Consider location/radiation/quality/duration/timing/severity/associated sxs/prior Treatment) Patient is a 44 y.o. female presenting with chest pain. The history is provided by the patient.  Chest Pain She was in emergency department 2 days ago and diagnosed with possible ulcer and discharged with prescriptions for omeprazole and sucralfate. She had been given a GI cocktail in the ED. She woke up that night with a burning feeling in her chest. Pain is worse when she lays back and better when she sits up. It is not affected by eating. She has been taking the omeprazole and sucralfate over the last 2 days. Symptoms do not seem to be getting worse but they're not getting better either. She denies nausea or vomiting. She did notice that the pain got worse after drinking some soda.  Past Medical History  Diagnosis Date  . Hypertension    Past Surgical History  Procedure Laterality Date  . Rapid heart rate     History reviewed. No pertinent family history. Social History  Substance Use Topics  . Smoking status: Never Smoker   . Smokeless tobacco: Never Used  . Alcohol Use: No   OB History    No data available     Review of Systems  Cardiovascular: Positive for chest pain.  All other systems reviewed and are negative.     Allergies  Benadryl and Penicillins  Home Medications   Prior to Admission medications   Medication Sig Start Date End Date Taking? Authorizing Provider  amLODipine (NORVASC) 5 MG tablet Take 5 mg by mouth daily.   Yes Historical Provider, MD  meclizine (ANTIVERT) 50 MG tablet Take 1 tablet (50 mg total) by mouth 3 (three) times daily as needed. Patient taking differently: Take 50 mg by mouth 3 (three) times daily as needed for dizziness.  12/26/14  Yes Ivery Quale, PA-C   medroxyPROGESTERone (DEPO-PROVERA) 150 MG/ML injection Inject 150 mg into the muscle every 3 (three) months.   Yes Historical Provider, MD  omeprazole (PRILOSEC) 20 MG capsule Take 1 capsule (20 mg total) by mouth daily. 07/11/15  Yes Roxy Horseman, PA-C  ondansetron (ZOFRAN) 4 MG tablet Take 1 tablet (4 mg total) by mouth every 6 (six) hours. Patient taking differently: Take 4 mg by mouth 2 (two) times daily as needed for nausea or vomiting.  11/30/12  Yes Ivery Quale, PA-C  sucralfate (CARAFATE) 1 GM/10ML suspension Take 10 mLs (1 g total) by mouth 4 (four) times daily -  with meals and at bedtime. 07/11/15  Yes Roxy Horseman, PA-C  oxymetazoline (AFRIN NASAL SPRAY) 0.05 % nasal spray Place 2 sprays into both nostrils every 8 (eight) hours. Patient not taking: Reported on 07/11/2015 12/26/14   Ivery Quale, PA-C  propranolol (INDERAL) 10 MG tablet Take 1 tablet (10 mg total) by mouth as needed. Patient not taking: Reported on 07/11/2015 12/20/12   Wendall Stade, MD   BP 146/88 mmHg  Pulse 75  Temp(Src) 97.9 F (36.6 C) (Oral)  Resp 20  Ht 5\' 4"  (1.626 m)  Wt 190 lb (86.183 kg)  BMI 32.60 kg/m2  SpO2 100% Physical Exam  Nursing note and vitals reviewed.  43 year old female, resting comfortably and in no acute distress. Vital signs are significant for mild hypertension. Oxygen saturation is 100%, which is  normal. Head is normocephalic and atraumatic. PERRLA, EOMI. Oropharynx is clear. Neck is nontender and supple without adenopathy or JVD. Back is nontender and there is no CVA tenderness. Lungs are clear without rales, wheezes, or rhonchi. Chest is nontender. Heart has regular rate and rhythm without murmur. Abdomen is soft, flat, with moderate epigastric tenderness. There is no rebound or guarding. There are no masses or hepatosplenomegaly and peristalsis is normoactive. Extremities have no cyanosis or edema, full range of motion is present. Skin is warm and dry without  rash. Neurologic: Mental status is normal, cranial nerves are intact, there are no motor or sensory deficits.  ED Course  Procedures (including critical care time) Labs Review Results for orders placed or performed during the hospital encounter of 07/13/15  CBC  Result Value Ref Range   WBC 10.0 4.0 - 10.5 K/uL   RBC 4.76 3.87 - 5.11 MIL/uL   Hemoglobin 14.2 12.0 - 15.0 g/dL   HCT 16.1 09.6 - 04.5 %   MCV 88.0 78.0 - 100.0 fL   MCH 29.8 26.0 - 34.0 pg   MCHC 33.9 30.0 - 36.0 g/dL   RDW 40.9 81.1 - 91.4 %   Platelets 304 150 - 400 K/uL  Comprehensive metabolic panel  Result Value Ref Range   Sodium 138 135 - 145 mmol/L   Potassium 3.5 3.5 - 5.1 mmol/L   Chloride 110 101 - 111 mmol/L   CO2 20 (L) 22 - 32 mmol/L   Glucose, Bld 82 65 - 99 mg/dL   BUN 9 6 - 20 mg/dL   Creatinine, Ser 7.82 (H) 0.44 - 1.00 mg/dL   Calcium 8.9 8.9 - 95.6 mg/dL   Total Protein 8.4 (H) 6.5 - 8.1 g/dL   Albumin 4.5 3.5 - 5.0 g/dL   AST 18 15 - 41 U/L   ALT 12 (L) 14 - 54 U/L   Alkaline Phosphatase 54 38 - 126 U/L   Total Bilirubin 0.7 0.3 - 1.2 mg/dL   GFR calc non Af Amer >60 >60 mL/min   GFR calc Af Amer >60 >60 mL/min   Anion gap 8 5 - 15  Troponin I  Result Value Ref Range   Troponin I <0.03 <0.031 ng/mL   Imaging Review Dg Chest 2 View  07/13/2015   CLINICAL DATA:  Chest pain.  EXAM: CHEST  2 VIEW  COMPARISON:  Radiographs 07/11/2015  FINDINGS: The cardiomediastinal contours are normal. The lungs are clear. Pulmonary vasculature is normal. No consolidation, pleural effusion, or pneumothorax. No acute osseous abnormalities are seen.  IMPRESSION: No acute pulmonary process.   Electronically Signed   By: Rubye Oaks M.D.   On: 07/13/2015 21:05   I have personally reviewed and evaluated these images and lab results as part of my medical decision-making.   EKG Interpretation   Date/Time:  Monday July 13 2015 17:45:39 EDT Ventricular Rate:  67 PR Interval:  146 QRS Duration: 82 QT  Interval:  408 QTC Calculation: 431 R Axis:   45 Text Interpretation:  Normal sinus rhythm Normal ECG Since last tracing  rate slower (11 Jul 2015) Confirmed by KNAPP  MD-I, IVA ([id number]) on  07/13/2015 7:52:01 PM      MDM   Final diagnoses:  GERD without esophagitis    Burning chest pain worse when supine suspicious for gastroesophageal reflux. ECG and chest x-ray are normal and labs are unremarkable. I suspect she will just need additional time on the proton pump inhibitor  She was given a  dose of Maalox with good relief. For the short-term, I will add an H2 blocker to her regimen and she is referred back to her PCP.  Dione Booze, MD 07/13/15 2354  Dione Booze, MD 08/06/15 [phone]

## 2015-07-14 NOTE — ED Notes (Signed)
Patient verbalizes understanding of discharge instructions, home care and follow up care. Patient ambulatory out of department at this time. 

## 2015-07-23 ENCOUNTER — Emergency Department (HOSPITAL_COMMUNITY): Payer: Managed Care, Other (non HMO)

## 2015-07-23 ENCOUNTER — Encounter (HOSPITAL_COMMUNITY): Payer: Self-pay | Admitting: *Deleted

## 2015-07-23 ENCOUNTER — Emergency Department (HOSPITAL_COMMUNITY)
Admission: EM | Admit: 2015-07-23 | Discharge: 2015-07-23 | Disposition: A | Discharge status: Home / Self Care | Payer: Managed Care, Other (non HMO) | Attending: Emergency Medicine | Admitting: Emergency Medicine

## 2015-07-23 DIAGNOSIS — Z880 Allergy status to penicillin: Secondary | ICD-10-CM | POA: Diagnosis not present

## 2015-07-23 DIAGNOSIS — I10 Essential (primary) hypertension: Secondary | ICD-10-CM | POA: Insufficient documentation

## 2015-07-23 DIAGNOSIS — R0789 Other chest pain: Secondary | ICD-10-CM | POA: Diagnosis not present

## 2015-07-23 DIAGNOSIS — K219 Gastro-esophageal reflux disease without esophagitis: Secondary | ICD-10-CM | POA: Insufficient documentation

## 2015-07-23 DIAGNOSIS — Z79899 Other long term (current) drug therapy: Secondary | ICD-10-CM | POA: Diagnosis not present

## 2015-07-23 DIAGNOSIS — R079 Chest pain, unspecified: Secondary | ICD-10-CM | POA: Diagnosis present

## 2015-07-23 LAB — I-STAT CHEM 8, ED
BUN: 4 mg/dL — AB (ref 6–20)
CHLORIDE: 106 mmol/L (ref 101–111)
Calcium, Ion: 1.21 mmol/L (ref 1.12–1.23)
Creatinine, Ser: 1.1 mg/dL — ABNORMAL HIGH (ref 0.44–1.00)
Glucose, Bld: 88 mg/dL (ref 65–99)
HEMATOCRIT: 48 % — AB (ref 36.0–46.0)
Hemoglobin: 16.3 g/dL — ABNORMAL HIGH (ref 12.0–15.0)
POTASSIUM: 3.2 mmol/L — AB (ref 3.5–5.1)
SODIUM: 142 mmol/L (ref 135–145)
TCO2: 21 mmol/L (ref 0–100)

## 2015-07-23 LAB — I-STAT TROPONIN, ED: TROPONIN I, POC: 0 ng/mL (ref 0.00–0.08)

## 2015-07-23 MED ORDER — CLONIDINE HCL 0.1 MG/24HR TD PTWK: 0.1000 mg | MEDICATED_PATCH | Freq: Once | TRANSDERMAL | Status: DC

## 2015-07-23 NOTE — ED Notes (Signed)
Pt comes in with chest pain starting last night in her right chest. Pt describes them as sharp and stabbing. Pt was recently here for GERD and an ulcer. Pt feels somewhat the same but in a different location.

## 2015-07-23 NOTE — ED Provider Notes (Signed)
CSN: [id number]     Arrival date & time 07/23/15  1109 History   First MD Initiated Contact with Patient 07/23/15 1111     Chief Complaint  Patient presents with  . Chest Pain      HPI Pt comes in with chest pain starting last night in her right chest. Pt describes them as sharp and stabbing. Pt was recently here for GERD and an ulcer. Pt feels somewhat the same but in a different location. Past Medical History  Diagnosis Date  . Hypertension    Past Surgical History  Procedure Laterality Date  . Rapid heart rate     No family history on file. Social History  Substance Use Topics  . Smoking status: Never Smoker   . Smokeless tobacco: Never Used  . Alcohol Use: No   OB History    No data available     Review of Systems  All other systems reviewed and are negative  Allergies  Benadryl and Penicillins  Home Medications   Prior to Admission medications   Medication Sig Start Date End Date Taking? Authorizing Provider  amLODipine (NORVASC) 5 MG tablet Take 5 mg by mouth daily.   Yes Historical Provider, MD  meclizine (ANTIVERT) 50 MG tablet Take 1 tablet (50 mg total) by mouth 3 (three) times daily as needed. 12/26/14  Yes Ivery Quale, PA-C  medroxyPROGESTERone (DEPO-PROVERA) 150 MG/ML injection Inject 150 mg into the muscle every 3 (three) months.   Yes Historical Provider, MD  omeprazole (PRILOSEC) 20 MG capsule Take 1 capsule (20 mg total) by mouth daily. 07/11/15  Yes Roxy Horseman, PA-C  ondansetron (ZOFRAN) 4 MG tablet Take 1 tablet (4 mg total) by mouth every 6 (six) hours. Patient taking differently: Take 4 mg by mouth 2 (two) times daily as needed for nausea or vomiting.  11/30/12  Yes Ivery Quale, PA-C  sucralfate (CARAFATE) 1 G tablet Take 1 g by mouth 4 (four) times daily.  07/20/15  Yes Historical Provider, MD  oxymetazoline (AFRIN NASAL SPRAY) 0.05 % nasal spray Place 2 sprays into both nostrils every 8 (eight) hours. Patient not taking: Reported on  07/23/2015 12/26/14   Ivery Quale, PA-C   BP 141/85 mmHg  Pulse 76  Temp(Src) 98.3 F (36.8 C) (Oral)  Resp 14  Ht  (1.626 m)  Wt 182 lb (82.555 kg)  BMI 31.22 kg/m2  SpO2 100% Physical Exam Physical Exam  Nursing note and vitals reviewed. Constitutional: She is oriented to person, place, and time. She appears well-developed and well-nourished. No distress.  HENT:  Head: Normocephalic and atraumatic.  Eyes: Pupils are equal, round, and reactive to light.  Neck: Normal range of motion.  Cardiovascular: Normal rate and intact distal pulses.   Pulmonary/Chest: No respiratory distress.  large pendulous breast which she states are 36 DD size. Abdominal: Normal appearance. She exhibits no distension.  Musculoskeletal: Normal range of motion.  Neurological: She is alert and oriented to person, place, and time. No cranial nerve deficit.  Skin: Skin is warm and dry. No rash noted.  Psychiatric: She has a normal mood and affect. Her behavior is normal.   ED Course  Procedures (including critical care time) Labs Review Labs Reviewed  I-STAT CHEM 8, ED - Abnormal; Notable for the following:    Potassium 3.2 (*)    BUN 4 (*)    Creatinine, Ser 1.10 (*)    Hemoglobin 16.3 (*)    HCT 48.0 (*)    All other components  within normal limits  I-STAT TROPOININ, ED    Imaging Review No results found. I have personally reviewed and evaluated these images and lab results as part of my medical decision-making.   EKG Interpretation   Date/Time:  Thursday July 23 2015 11:12:42 EDT Ventricular Rate:  93 PR Interval:  138 QRS Duration: 72 QT Interval:  364 QTC Calculation: 452 R Axis:   42 Text Interpretation:  Normal sinus rhythm with sinus arrhythmia Normal ECG  Confirmed by BEATON  MD, ROBERT ([id number]) on 07/23/2015 11:25:27 AM      MDM   Final diagnoses:  Chest wall pain        Nelva Nay, MD 07/23/15 1223

## 2015-07-23 NOTE — Discharge Instructions (Signed)
Chest Wall Pain °Chest wall pain is pain felt in or around the chest bones and muscles. It may take up to 6 weeks to get better. It may take longer if you are active. Chest wall pain can happen on its own. Other times, things like germs, injury, coughing, or exercise can cause the pain. °HOME CARE  °· Avoid activities that make you tired or cause pain. Try not to use your chest, belly (abdominal), or side muscles. Do not use heavy weights. °· Put ice on the sore area. °¨ Put ice in a plastic bag. °¨ Place a towel between your skin and the bag. °¨ Leave the ice on for 15-20 minutes for the first 2 days. °· Only take medicine as told by your doctor. °GET HELP RIGHT AWAY IF:  °· You have more pain or are very uncomfortable. °· You have a fever. °· Your chest pain gets worse. °· You have new problems. °· You feel sick to your stomach (nauseous) or throw up (vomit). °· You start to sweat or feel lightheaded. °· You have a cough with mucus (phlegm). °· You cough up blood. °MAKE SURE YOU:  °· Understand these instructions. °· Will watch your condition. °· Will get help right away if you are not doing well or get worse. °Document Released: 04/04/2008 Document Revised: 01/09/2012 Document Reviewed: 06/13/2011 °[employer]® Patient Information ©2015 [employer]. This information is not intended to replace advice given to you by your health care provider. Make sure you discuss any questions you have with your health care provider. ° °

## 2015-08-04 ENCOUNTER — Emergency Department (HOSPITAL_COMMUNITY)
Admission: EM | Admit: 2015-08-04 | Discharge: 2015-08-04 | Disposition: A | Discharge status: Home / Self Care | Payer: Managed Care, Other (non HMO) | Attending: Emergency Medicine | Admitting: Emergency Medicine

## 2015-08-04 ENCOUNTER — Encounter (HOSPITAL_COMMUNITY): Payer: Self-pay | Admitting: Emergency Medicine

## 2015-08-04 DIAGNOSIS — Z880 Allergy status to penicillin: Secondary | ICD-10-CM | POA: Diagnosis not present

## 2015-08-04 DIAGNOSIS — R220 Localized swelling, mass and lump, head: Secondary | ICD-10-CM | POA: Diagnosis present

## 2015-08-04 DIAGNOSIS — J339 Nasal polyp, unspecified: Secondary | ICD-10-CM | POA: Insufficient documentation

## 2015-08-04 DIAGNOSIS — Z79899 Other long term (current) drug therapy: Secondary | ICD-10-CM | POA: Insufficient documentation

## 2015-08-04 DIAGNOSIS — I10 Essential (primary) hypertension: Secondary | ICD-10-CM | POA: Insufficient documentation

## 2015-08-04 MED ORDER — MUPIROCIN CALCIUM 2 % NA OINT: TOPICAL_OINTMENT | NASAL | Status: DC

## 2015-08-04 NOTE — Discharge Instructions (Signed)
Take Claritin or Zyrtec daily. Use the antibiotic ointment as directed. If symptoms persist follow up with Ear Nose Throat doctor. Dr. Suszanne Conners.

## 2015-08-04 NOTE — ED Provider Notes (Signed)
CSN: [id number]     Arrival date & time 08/04/15  1719 History   First MD Initiated Contact with Patient 08/04/15 1745     Chief Complaint  Patient presents with  . Facial Swelling    LT nare     (Consider location/radiation/quality/duration/timing/severity/associated sxs/prior Treatment) The history is provided by the patient. No language interpreter was used.   Karla Ward is a 44 y.o. female who presents to the ED with a sore area to the left nostril that she first noted this am. She complains of stinging and burning to the area. She denies fever, chills or URI symptoms. She denies any other problems.   Past Medical History  Diagnosis Date  . Hypertension    Past Surgical History  Procedure Laterality Date  . Rapid heart rate     No family history on file. Social History  Substance Use Topics  . Smoking status: Never Smoker   . Smokeless tobacco: Never Used  . Alcohol Use: No   OB History    No data available     Review of Systems Negative except as stated in HPI   Allergies  Benadryl and Penicillins  Home Medications   Prior to Admission medications   Medication Sig Start Date End Date Taking? Authorizing Provider  amLODipine (NORVASC) 5 MG tablet Take 5 mg by mouth daily.   Yes Historical Provider, MD  medroxyPROGESTERone (DEPO-PROVERA) 150 MG/ML injection Inject 150 mg into the muscle every 3 (three) months.   Yes Historical Provider, MD  omeprazole (PRILOSEC) 20 MG capsule Take 1 capsule (20 mg total) by mouth daily. 07/11/15  Yes Roxy Horseman, PA-C  sucralfate (CARAFATE) 1 GM/10ML suspension Take 1 g by mouth 4 (four) times daily.  07/28/15  Yes Historical Provider, MD  ciprofloxacin (CIPRO) 500 MG tablet Take 500 mg by mouth 2 (two) times daily. 08/04/15   Historical Provider, MD  mupirocin nasal ointment (BACTROBAN) 2 % Apply in each nostril daily 08/04/15   Kimika Streater Orlene Och, NP   BP 160/87 mmHg  Pulse 76  Temp(Src) 98.2 F (36.8 C) (Oral)  Resp 16   Ht  (1.626 m)  Wt 176 lb (79.833 kg)  BMI 30.20 kg/m2  SpO2 100%  LMP 07/26/2015 Physical Exam  Constitutional: She is oriented to person, place, and time. She appears well-developed and well-nourished. No distress.  HENT:  Nose: Mucosal edema present.  Red swollen area to the left nostril, tender with palpation.   Eyes: Conjunctivae and EOM are normal.  Neck: Normal range of motion. Neck supple.  Cardiovascular: Normal rate and regular rhythm.   Pulmonary/Chest: Effort normal and breath sounds normal.  Musculoskeletal: Normal range of motion.  Neurological: She is alert and oriented to person, place, and time. No cranial nerve deficit.  Skin: Skin is warm and dry.  Psychiatric: She has a normal mood and affect. Her behavior is normal.  Nursing note and vitals reviewed.   ED Course  Procedures  Dr. Clayborne Dana in to examine the patient.  MDM  44 y.o. female with pain and swelling inside the left nostril. Stable for d/c without septal hematoma, no fever and does not appear toxic. Will treat with Bactroban ointment. She will follow up with ENT if symptoms persist. Discussed with the patient and all questioned fully answered.   Final diagnoses:  Nasal polyp       Janne Napoleon, NP 08/04/15 2337  Marily Memos, MD 08/04/15 [phone]

## 2015-08-04 NOTE — ED Provider Notes (Signed)
Medical screening examination/treatment/procedure(s) were conducted as a shared visit with non-physician practitioner(s) and myself.  I personally evaluated the patient during the encounter.  Patient with history of allergies here with likely nasal polyps. Doubt abscess, cancer or other causes. We'll treat conservatively for now and have her follow-up with her primary doctor.  Marily Memos, MD 08/04/15 [phone]

## 2015-08-04 NOTE — ED Notes (Signed)
Pt c/o of a bump to LT nares that she noticed this morning. Pt states, "it stings."

## 2015-08-06 ENCOUNTER — Ambulatory Visit (INDEPENDENT_AMBULATORY_CARE_PROVIDER_SITE_OTHER): Payer: Managed Care, Other (non HMO) | Admitting: Otolaryngology

## 2015-08-06 DIAGNOSIS — J342 Deviated nasal septum: Secondary | ICD-10-CM

## 2015-08-06 DIAGNOSIS — J343 Hypertrophy of nasal turbinates: Secondary | ICD-10-CM | POA: Diagnosis not present

## 2015-08-06 DIAGNOSIS — K219 Gastro-esophageal reflux disease without esophagitis: Secondary | ICD-10-CM | POA: Diagnosis not present

## 2015-08-09 ENCOUNTER — Encounter (HOSPITAL_COMMUNITY): Payer: Self-pay

## 2015-08-09 ENCOUNTER — Emergency Department (HOSPITAL_COMMUNITY)
Admission: EM | Admit: 2015-08-09 | Discharge: 2015-08-09 | Disposition: A | Discharge status: Home / Self Care | Payer: Managed Care, Other (non HMO) | Attending: Emergency Medicine | Admitting: Emergency Medicine

## 2015-08-09 DIAGNOSIS — R112 Nausea with vomiting, unspecified: Secondary | ICD-10-CM | POA: Diagnosis not present

## 2015-08-09 DIAGNOSIS — Z79899 Other long term (current) drug therapy: Secondary | ICD-10-CM | POA: Diagnosis not present

## 2015-08-09 DIAGNOSIS — Z8719 Personal history of other diseases of the digestive system: Secondary | ICD-10-CM

## 2015-08-09 DIAGNOSIS — R0989 Other specified symptoms and signs involving the circulatory and respiratory systems: Secondary | ICD-10-CM

## 2015-08-09 DIAGNOSIS — K219 Gastro-esophageal reflux disease without esophagitis: Secondary | ICD-10-CM | POA: Diagnosis not present

## 2015-08-09 DIAGNOSIS — J351 Hypertrophy of tonsils: Secondary | ICD-10-CM | POA: Insufficient documentation

## 2015-08-09 DIAGNOSIS — Z880 Allergy status to penicillin: Secondary | ICD-10-CM | POA: Diagnosis not present

## 2015-08-09 DIAGNOSIS — I10 Essential (primary) hypertension: Secondary | ICD-10-CM | POA: Insufficient documentation

## 2015-08-09 HISTORY — DX: Gastro-esophageal reflux disease without esophagitis: K21.9

## 2015-08-09 MED ORDER — ONDANSETRON 4 MG PO TBDP
8.0000 mg | ORAL_TABLET | Freq: Once | ORAL | Status: AC
Start: 2015-08-09 — End: 2015-08-09
  Administered 2015-08-09: 8 mg via ORAL
  Filled 2015-08-09: qty 2

## 2015-08-09 MED ORDER — ONDANSETRON 4 MG PO TBDP: 4.0000 mg | ORAL_TABLET | Freq: Three times a day (TID) | ORAL | Status: DC | PRN

## 2015-08-09 MED ORDER — RANITIDINE HCL 150 MG PO TABS: 150.0000 mg | ORAL_TABLET | Freq: Every day | ORAL | Status: DC

## 2015-08-09 NOTE — ED Provider Notes (Signed)
CSN: [id number]     Arrival date & time 08/09/15  1111 History   First MD Initiated Contact with Patient 08/09/15 1127     Chief Complaint  Patient presents with  . Gastrophageal Reflux     (Consider location/radiation/quality/duration/timing/severity/associated sxs/prior Treatment) HPI Comments: Karla Ward is a 44 y.o. female with a PMHx of HTN and GERD, who presents to the ED with complaints of gradually improving GERD symptoms 1 month. She reports she's been taking Carafate and Prilosec which a been helping with her epigastric pain and heartburn, but on Friday after taking her Prilosec she began to have worsening nausea and blames it on the ongoing sensation of something being stuck in her throat. She denies any throat pain to states that it feels like she needs to swallow multiple times because her something in her throat, stating that eating or drinking makes her symptoms worse. Overall the rest of her symptoms for which she was previously been seen for have improved, but on Friday after taking prilosec she had one episode of nonbloody nonbilious emesis as well as another episode yesterday. She has had some ongoing nausea with eating, leading to her not eating as much over the last several days although she feels hungry she is afraid to get nauseous. She saw her ENT doctor yesterday who stated that aside from hypertrophied tonsils there was no findings for her symptoms.  She denies any fevers, chills, chest pain, shortness breath, abdominal pain, melena, hematochezia, diarrhea, constipation, hematemesis, obstipation, dysuria, hematuria, numbness, tingling, weakness, NSAID use, alcohol use, recent travel, sick contacts, or suspicious food intake. Denies trouble swallowing, just has the globus sensation.  Patient is a 44 y.o. female presenting with GERD. The history is provided by the patient and medical records. No language interpreter was used.  Gastrophageal Reflux This is a chronic  problem. The current episode started more than 1 month ago. The problem occurs constantly. The problem has been gradually improving. Associated symptoms include nausea and vomiting. Pertinent negatives include no abdominal pain, arthralgias, chest pain, chills, fever, myalgias, numbness, sore throat, urinary symptoms or weakness. The symptoms are aggravated by eating and drinking. Treatments tried: prilosec and carafate. The treatment provided significant relief.    Past Medical History  Diagnosis Date  . Hypertension   . GERD (gastroesophageal reflux disease)    Past Surgical History  Procedure Laterality Date  . Rapid heart rate     History reviewed. No pertinent family history. Social History  Substance Use Topics  . Smoking status: Never Smoker   . Smokeless tobacco: Never Used  . Alcohol Use: No   OB History    No data available     Review of Systems  Constitutional: Negative for fever and chills.  HENT: Negative for sore throat and trouble swallowing.        +globus sensation  Respiratory: Negative for shortness of breath.   Cardiovascular: Negative for chest pain.  Gastrointestinal: Positive for nausea and vomiting. Negative for abdominal pain, diarrhea and constipation.  Genitourinary: Negative for dysuria and hematuria.  Musculoskeletal: Negative for myalgias and arthralgias.  Skin: Negative for color change.  Allergic/Immunologic: Negative for immunocompromised state.  Neurological: Negative for weakness and numbness.  Psychiatric/Behavioral: Negative for confusion.   10 Systems reviewed and are negative for acute change except as noted in the HPI.    Allergies  Benadryl and Penicillins  Home Medications   Prior to Admission medications   Medication Sig Start Date End Date Taking? Authorizing Provider  amLODipine (NORVASC) 5 MG tablet Take 5 mg by mouth daily.    Historical Provider, MD  ciprofloxacin (CIPRO) 500 MG tablet Take 500 mg by mouth 2 (two) times  daily. 08/04/15   Historical Provider, MD  medroxyPROGESTERone (DEPO-PROVERA) 150 MG/ML injection Inject 150 mg into the muscle every 3 (three) months.    Historical Provider, MD  mupirocin nasal ointment (BACTROBAN) 2 % Apply in each nostril daily 08/04/15   Janne Napoleon, NP  omeprazole (PRILOSEC) 20 MG capsule Take 1 capsule (20 mg total) by mouth daily. 07/11/15   Roxy Horseman, PA-C  sucralfate (CARAFATE) 1 GM/10ML suspension Take 1 g by mouth 4 (four) times daily.  07/28/15   Historical Provider, MD   BP 135/82 mmHg  Pulse 97  Temp(Src) 98.2 F (36.8 C) (Oral)  Resp 16  SpO2 100%  LMP 07/26/2015 Physical Exam  Constitutional: She is oriented to person, place, and time. Vital signs are normal. She appears well-developed and well-nourished.  Non-toxic appearance. No distress.  Afebrile, nontoxic, NAD  HENT:  Head: Normocephalic and atraumatic.  Mouth/Throat: Uvula is midline and mucous membranes are normal. No trismus in the jaw. No uvula swelling. Posterior oropharyngeal edema (2+ hypertrophied tonsils bilaterally which is chronic per pt) present. No oropharyngeal exudate, posterior oropharyngeal erythema or tonsillar abscesses.  Oropharynx clear and moist, without uvular swelling or deviation, no trismus or drooling, with 2+ hypertrophied tonsils bilaterally which is chronic and unchanged per pt, no tonsillar erythema, no exudates.    Eyes: Conjunctivae and EOM are normal. Right eye exhibits no discharge. Left eye exhibits no discharge.  Neck: Normal range of motion. Neck supple. No thyroid mass and no thyromegaly present.  No thyromegaly or masses  Cardiovascular: Normal rate, regular rhythm, normal heart sounds and intact distal pulses.  Exam reveals no gallop and no friction rub.   No murmur heard. Pulmonary/Chest: Effort normal and breath sounds normal. No respiratory distress. She has no decreased breath sounds. She has no wheezes. She has no rhonchi. She has no rales.  Abdominal:  Soft. Normal appearance and bowel sounds are normal. She exhibits no distension. There is no tenderness. There is no rigidity, no rebound, no guarding, no CVA tenderness, no tenderness at McBurney's point and negative Murphy's sign.  Soft, NTND, +BS throughout, no r/g/r, neg murphy's, neg mcburney's, no CVA TTP   Musculoskeletal: Normal range of motion.  Neurological: She is alert and oriented to person, place, and time. She has normal strength. No sensory deficit.  Skin: Skin is warm, dry and intact. No rash noted.  Psychiatric: She has a normal mood and affect.  Nursing note and vitals reviewed.   ED Course  Procedures (including critical care time) Labs Review Labs Reviewed - No data to display  Imaging Review No results found. I have personally reviewed and evaluated these images and lab results as part of my medical decision-making.   EKG Interpretation None      MDM   Final diagnoses:  Globus sensation  Non-intractable vomiting with nausea, vomiting of unspecified type  H/O gastroesophageal reflux (GERD)  Chronic tonsillar hypertrophy    44 y.o. female here with globus sensation in throat and some ongoing nausea with vomiting since being diagnosed with GERD last month. Overall the rest of her symptoms have improved, including the reflux and epigastric/chest pain, but the globus sensation persists. Went to ENT on 08/06/15 and they stated she had hypertrophied tonsils but no other issues noted per her accounts. On exam, hypertrophied tonsils  noted without erythema/exudates, no thyromegaly, no abdominal tenderness. Likely just globus sensation from GERD, but symptoms improving. Will give zofran and PO trial here, if she tolerates this will d/c home with zofran. Diet modifications to help her symptoms discussed. Will reassess shortly.   1:19 PM Nausea improved, tolerating PO well. Will add zantac and zofran to her regimen, and have her f/up with GI to have ongoing evaluation of  GERD, and to discuss possible Hpylori testing and/or EGD if symptoms persist. Lifestyle modifications and diet changes discussed. I explained the diagnosis and have given explicit precautions to return to the ER including for any other new or worsening symptoms. The patient understands and accepts the medical plan as it's been dictated and I have answered their questions. Discharge instructions concerning home care and prescriptions have been given. The patient is STABLE and is discharged to home in good condition.  BP 135/82 mmHg  Pulse 97  Temp(Src) 98.2 F (36.8 C) (Oral)  Resp 16  SpO2 100%  LMP 07/26/2015  Meds ordered this encounter  Medications  . ondansetron (ZOFRAN-ODT) disintegrating tablet 8 mg    Sig:   . ondansetron (ZOFRAN ODT) 4 MG disintegrating tablet    Sig: Take 1 tablet (4 mg total) by mouth every 8 (eight) hours as needed for nausea or vomiting.    Dispense:  15 tablet    Refill:  0    Order Specific Question:  Supervising Provider    Answer:  MILLER, BRIAN [[id number]]  . ranitidine (ZANTAC) 150 MG tablet    Sig: Take 1 tablet (150 mg total) by mouth at bedtime.    Dispense:  30 tablet    Refill:  0    Order Specific Question:  Supervising Provider    Answer:  Eber Hong [[id number]]     Karla Halvorsen Camprubi-Soms, PA-C 08/09/15 1320  Marily Memos, MD 08/12/15 2217

## 2015-08-09 NOTE — Discharge Instructions (Signed)
The sensation in your throat is likely from the reflux you've been treating for the last month. The nausea is likely from an ulcer. You will need to continue to take prilosec as directed, and start taking zantac as well, and avoid spicy/fatty/acidic foods. Avoid laying down flat within 30 minutes of eating. Avoid NSAIDs like ibuprofen on an empty stomach. Use zofran as needed for nausea. Use tylenol for any pain. Follow up with the gastroenterologist in 1-2 weeks for ongoing evaluation of your symptoms. Return to the ER for changes or worsening symptoms.   Nausea and Vomiting Nausea means you feel sick to your stomach. Throwing up (vomiting) is a reflex where stomach contents come out of your mouth. HOME CARE   Take medicine as told by your doctor.  Do not force yourself to eat. However, you do need to drink fluids.  If you feel like eating, eat a normal diet as told by your doctor.  Eat rice, wheat, potatoes, bread, lean meats, yogurt, fruits, and vegetables.  Avoid high-fat foods.  Drink enough fluids to keep your pee (urine) clear or pale yellow.  Ask your doctor how to replace body fluid losses (rehydrate). Signs of body fluid loss (dehydration) include:  Feeling very thirsty.  Dry lips and mouth.  Feeling dizzy.  Dark pee.  Peeing less than normal.  Feeling confused.  Fast breathing or heart rate. GET HELP RIGHT AWAY IF:   You have blood in your throw up.  You have black or bloody poop (stool).  You have a bad headache or stiff neck.  You feel confused.  You have bad belly (abdominal) pain.  You have chest pain or trouble breathing.  You do not pee at least once every 8 hours.  You have cold, clammy skin.  You keep throwing up after 24 to 48 hours.  You have a fever. MAKE SURE YOU:   Understand these instructions.  Will watch your condition.  Will get help right away if you are not doing well or get worse.   This information is not intended to  replace advice given to you by your health care provider. Make sure you discuss any questions you have with your health care provider.   Document Released: 04/04/2008 Document Revised: 01/09/2012 Document Reviewed: 03/18/2011 Elsevier Interactive Patient Education 2016 [employer]  Indigestion Indigestion is a feeling of pain, discomfort, burning, or fullness in the upper part of your belly (abdomen). It can come and go. It may occur often or rarely. Indigestion tends to happen while you are eating or right after you have finished eating. It may be worse at night and while bending over or lying down. HOME CARE Take these actions to lessen your pain or discomfort and to help avoid problems. Diet  Follow a diet as told by your doctor. You may need to avoid foods and drinks such as:  Coffee and tea (with or without caffeine).  Drinks that contain alcohol.  Energy drinks and sports drinks.  Carbonated drinks or sodas.  Chocolate and cocoa.  Peppermint and mint flavorings.  Garlic and onions.  Horseradish.  Spicy and acidic foods, such as peppers, chili powder, curry powder, vinegar, hot sauces, and BBQ sauce.  Citrus fruit juices and citrus fruits, such as oranges, lemons, and limes.  Tomato-based foods, such as red sauce, chili, salsa, and pizza with red sauce.  Fried and fatty foods, such as donuts, french fries, potato chips, and high-fat dressings.  High-fat meats, such as hot dogs, rib  eye steak, sausage, ham, and bacon.  High-fat dairy items, such as whole milk, butter, and cream cheese.  Eat small meals often. Avoid eating large meals.  Avoid drinking large amounts of liquid with your meals.  Avoid eating meals during the 2-3 hours before bedtime.  Avoid lying down right after you eat.  Do not exercise right after you eat. General Instructions  Pay attention to any changes in your symptoms.  Take over-the-counter and prescription medicines only as told by  your doctor. Do not take aspirin, ibuprofen, or other NSAIDs unless your doctor says it is okay.  Do not use any tobacco products, including cigarettes, chewing tobacco, and e-cigarettes. If you need help quitting, ask your doctor.  Wear loose clothes. Do not wear anything tight around your waist.  Raise (elevate) the head of your bed about 6 inches (15 cm).  Try to lower your stress. If you need help doing this, ask your doctor.  If you are overweight, lose an amount of weight that is healthy for you. Ask your doctor about a safe weight loss goal.  Keep all follow-up visits as told by your doctor. This is important. GET HELP IF:  You have new symptoms.  You lose weight and you do not know why it is happening.  You have trouble swallowing, or it hurts to swallow.  Your symptoms do not get better with treatment.  Your symptoms last for more than two days.  You have a fever.  You throw up (vomit). GET HELP RIGHT AWAY IF:  You have pain in your arms, neck, jaw, teeth, or back.  You feel sweaty, dizzy, or light-headed.  You pass out (faint).  You have chest pain or shortness of breath.  You cannot stop throwing up, or you throw up blood.  Your poop (stool) is bloody or black.  You have very bad pain in your belly.   This information is not intended to replace advice given to you by your health care provider. Make sure you discuss any questions you have with your health care provider.   Document Released: 11/19/2010 Document Revised: 07/08/2015 Document Reviewed: 02/11/2015 Elsevier Interactive Patient Education [employer]

## 2015-08-09 NOTE — ED Provider Notes (Signed)
Medical screening examination/treatment/procedure(s) were conducted as a shared visit with non-physician practitioner(s) and myself.  I personally evaluated the patient during the encounter.  44 year old female here with nausea secondary to reflux. She also has difficulty swallowing and hasn't drinking normally in the last few days. Exam she is not tachycardic or particularly dehydrated. Abdomen is benign lung and heart sounds are normal. Very emergent causes for her symptoms she had these better control of her reflux and possible swallow study in the future. Follow-up primary doctor to get these done.    Marily Memos, MD 08/09/15 1235

## 2015-08-09 NOTE — ED Notes (Signed)
Pt seen in ED 07-11-15 for epigastric abd pain.  Was started on Prilosec and Carafate.  Symptoms seemed to improve until 2 days ago.  The feeling of not being able to swallow food, vomiting phlegm that feels like it is in her throat.  Seen ENT 08-06-15, no findings.

## 2015-08-11 ENCOUNTER — Other Ambulatory Visit (HOSPITAL_COMMUNITY): Payer: Self-pay | Admitting: Pulmonary Disease

## 2015-08-11 ENCOUNTER — Ambulatory Visit (HOSPITAL_COMMUNITY)
Admission: RE | Admit: 2015-08-11 | Discharge: 2015-08-11 | Disposition: A | Discharge status: Home / Self Care | Payer: Managed Care, Other (non HMO) | Source: Ambulatory Visit | Attending: Pulmonary Disease | Admitting: Pulmonary Disease

## 2015-08-11 DIAGNOSIS — R1310 Dysphagia, unspecified: Secondary | ICD-10-CM | POA: Diagnosis not present

## 2015-08-11 DIAGNOSIS — R1013 Epigastric pain: Secondary | ICD-10-CM | POA: Insufficient documentation

## 2015-08-13 ENCOUNTER — Ambulatory Visit (INDEPENDENT_AMBULATORY_CARE_PROVIDER_SITE_OTHER): Payer: Managed Care, Other (non HMO) | Admitting: Nurse Practitioner

## 2015-08-13 ENCOUNTER — Encounter: Payer: Self-pay | Admitting: Nurse Practitioner

## 2015-08-13 ENCOUNTER — Other Ambulatory Visit: Payer: Self-pay

## 2015-08-13 VITALS — BP 129/76 | HR 84 | Temp 97.8°F | Ht 64.0 in | Wt 172.0 lb

## 2015-08-13 DIAGNOSIS — K228 Other specified diseases of esophagus: Secondary | ICD-10-CM

## 2015-08-13 DIAGNOSIS — K219 Gastro-esophageal reflux disease without esophagitis: Secondary | ICD-10-CM

## 2015-08-13 DIAGNOSIS — R198 Other specified symptoms and signs involving the digestive system and abdomen: Secondary | ICD-10-CM

## 2015-08-13 HISTORY — PX: OTHER SURGICAL HISTORY: SHX169

## 2015-08-13 NOTE — Patient Instructions (Signed)
1. We'll schedule your procedure for you. 2. Restart taking your Prilosec 40 mg daily. 3. Return for follow-up in 6-8 weeks. 4. Further recommendations to be based on results of your procedure.

## 2015-08-13 NOTE — Assessment & Plan Note (Signed)
Patient complains of a esophageal foreign object sensation. Denies overt dysphagia symptoms, however she has to swallow multiple times because "it feels like something is there." Father was diagnosed at age 42 with throat cancer, although he was lifelong smoker. Upper GI series was negative as per history of present illness for mass, stricture, etc. She was started on a PPI and her GERD symptoms improved, however globulin sensation remained. It is been persistent for over a month now. At this point given her extensive workup we will proceed with an upper endoscopy to further evaluate her symptoms as noted above. Return for follow-up in 6-8 weeks.

## 2015-08-13 NOTE — Assessment & Plan Note (Signed)
Patient with a history of chronic GERD and presented to the emergency room of GERD-like symptoms at which point she was started on Prilosec 40 mg once daily and Carafate. Her symptoms improved, however her globulus sensation remained. Her PPI was recently discontinued and she was told its because testing should she did not have an ulcer. Upper GI series unremarkable as per history of present illness. At this point is possible that GERD as well as component of her symptoms. We will have her restart her PPI until she can be further evaluated with upper endoscopy. Return for follow-up in 6-8 weeks  Proceed with EGD with Dr. Darrick PennaFields in near future: the risks, benefits, and alternatives have been discussed with the patient in detail. The patient states understanding and desires to proceed.  The patient is not on any anticoagulants, anxiolytics, chronic pain medications, or antidepressants. She denies drinking alcohol or recreational drugs. Conscious sedation should be adequate for her procedure.

## 2015-08-13 NOTE — Progress Notes (Signed)
cc'ed to pcp °

## 2015-08-13 NOTE — Progress Notes (Signed)
Primary Care Physician:  Fredirick Maudlin, MD Primary Gastroenterologist:  Dr. Darrick Penna  Chief Complaint  Patient presents with  . Dysphagia    HPI:   44 year old female referred by PCP for dysphagia symptoms. PCP note reviewed. Patient last saw her PCP on 08/11/2015 with noted dysphagia and apparent history of peptic ulcer. PCPs plan is for upper GI and possible GI referral for possible endoscopy as well as swallow evaluation. Diagnostic EGD performed 08/11/2015 which found normal esophageal motility, no fixed stricture, fold thickening, or mass. Impression was unremarkable upper GI. Per PCP notes patient has a family history of throat cancer.  Today she states she is able to eat and keep it down. She has a sensation of a foreign body sensation/globulus sensation. Food does not get stuck on the way down. She is continuing to have these symptoms. Will occasionally wake up and the sensation is gone, but quickly returns. The sensation is not in her throat, seems more esophageal based on where she points to in order to localize. First noted about 1 month ago and is persistent. Denies solid food dysphagia and pill dysphagia. No choking on liquids. Has a history of GERD which was diagnosed with possible peptic ulcer and GERD. She was started on PPI and carafate, which has resolved her dyspepsia symptoms, although globulus sensation persists. Denies hematochezia, melena. Denies fever chills. States subjective 6 pound weight loss due to not eating as she's afraid to eat. Denies chest pain, dyspnea, dizziness, lightheadedness, syncope, near syncope. Denies any other upper or lower GI symptoms.  Her PPI was discontinued as she was told testing shows she does not have a ulcer. No EGD has been done. Per ER notes has been evaluated by ENT and notes hypertrophied tonsils (chronic) and no other issues and was recommended to continue on PPI in the ED 08/09/15.  Past Medical History  Diagnosis Date  .  Hypertension   . GERD (gastroesophageal reflux disease)     Past Surgical History  Procedure Laterality Date  . Rapid heart rate      Current Outpatient Prescriptions  Medication Sig Dispense Refill  . amLODipine (NORVASC) 5 MG tablet Take 5 mg by mouth daily.    . Meclizine HCl 25 MG CHEW Chew by mouth as needed.    . medroxyPROGESTERone (DEPO-PROVERA) 150 MG/ML injection Inject 150 mg into the muscle every 3 (three) months.    . ondansetron (ZOFRAN ODT) 4 MG disintegrating tablet Take 1 tablet (4 mg total) by mouth every 8 (eight) hours as needed for nausea or vomiting. 15 tablet 0  . ranitidine (ZANTAC) 150 MG tablet Take 1 tablet (150 mg total) by mouth at bedtime. 30 tablet 0  . sucralfate (CARAFATE) 1 GM/10ML suspension Take 1 g by mouth 4 (four) times daily.     . ciprofloxacin (CIPRO) 500 MG tablet Take 500 mg by mouth 2 (two) times daily.    Marland Kitchen omeprazole (PRILOSEC) 20 MG capsule Take 1 capsule (20 mg total) by mouth daily. (Patient not taking: Reported on 08/13/2015) 30 capsule 0  . [DISCONTINUED] propranolol (INDERAL) 10 MG tablet Take 1 tablet (10 mg total) by mouth as needed. (Patient not taking: Reported on 07/11/2015) 30 tablet 3   No current facility-administered medications for this visit.    Allergies as of 08/13/2015 - Review Complete 08/13/2015  Allergen Reaction Noted  . Benadryl [diphenhydramine hcl (sleep)]  12/26/2014  . Penicillins Hives 11/19/2012    No family history on file.  Social History  Social History  . Marital Status: Married    Spouse Name: N/A  . Number of Children: N/A  . Years of Education: N/A   Occupational History  . Not on file.   Social History Main Topics  . Smoking status: Never Smoker   . Smokeless tobacco: Never Used  . Alcohol Use: No  . Drug Use: No  . Sexual Activity: Yes    Birth Control/ Protection: Injection   Other Topics Concern  . Not on file   Social History Narrative    Review of Systems: 10-point ROS  negative except as per HPI    Physical Exam: BP 129/76 mmHg  Pulse 84  Temp(Src) 97.8 F (36.6 C)  Ht 5\' 4"  (1.626 m)  Wt 172 lb (78.019 kg)  BMI 29.51 kg/m2  LMP 08/12/2015 General:   Alert and oriented. Pleasant and cooperative. Well-nourished and well-developed.  Head:  Normocephalic and atraumatic. Ears:  Normal auditory acuity. Mouth:  No deformity or lesions, oral mucosa pink.  Throat/Neck:  Supple, without mass or thyromegaly. No gross edema noted. Cardiovascular:  S1, S2 present without murmurs appreciated. Extremities without clubbing or edema. Respiratory:  Clear to auscultation bilaterally. No wheezes, rales, or rhonchi. No distress.  Gastrointestinal:  +BS, soft, and non-distended. Mild epigastric TTP noted. No HSM noted. No guarding or rebound. No masses appreciated.  Rectal:  Deferred  Skin:  Intact without significant lesions or rashes. Neurologic:  Alert and oriented x4;  grossly normal neurologically. Psych:  Alert and cooperative. Normal mood and affect. Heme/Lymph/Immune: No significant cervical adenopathy. No excessive bruising noted.    08/13/2015 9:51 AM

## 2015-08-14 ENCOUNTER — Emergency Department (HOSPITAL_COMMUNITY)
Admission: EM | Admit: 2015-08-14 | Discharge: 2015-08-15 | Disposition: A | Discharge status: Home / Self Care | Payer: Managed Care, Other (non HMO) | Attending: Emergency Medicine | Admitting: Emergency Medicine

## 2015-08-14 ENCOUNTER — Encounter (HOSPITAL_COMMUNITY): Payer: Self-pay | Admitting: *Deleted

## 2015-08-14 DIAGNOSIS — I10 Essential (primary) hypertension: Secondary | ICD-10-CM | POA: Diagnosis not present

## 2015-08-14 DIAGNOSIS — Z79899 Other long term (current) drug therapy: Secondary | ICD-10-CM | POA: Insufficient documentation

## 2015-08-14 DIAGNOSIS — R350 Frequency of micturition: Secondary | ICD-10-CM | POA: Diagnosis present

## 2015-08-14 DIAGNOSIS — Z3202 Encounter for pregnancy test, result negative: Secondary | ICD-10-CM | POA: Insufficient documentation

## 2015-08-14 DIAGNOSIS — N390 Urinary tract infection, site not specified: Secondary | ICD-10-CM | POA: Insufficient documentation

## 2015-08-14 DIAGNOSIS — K219 Gastro-esophageal reflux disease without esophagitis: Secondary | ICD-10-CM | POA: Insufficient documentation

## 2015-08-14 DIAGNOSIS — Z880 Allergy status to penicillin: Secondary | ICD-10-CM | POA: Insufficient documentation

## 2015-08-14 LAB — PREGNANCY, URINE: Preg Test, Ur: NEGATIVE

## 2015-08-14 NOTE — ED Provider Notes (Signed)
CSN: [id number]     Arrival date & time 08/14/15  2129 History   First MD Initiated Contact with Patient 08/14/15 2219     Chief Complaint  Patient presents with  . Urinary Frequency     (Consider location/radiation/quality/duration/timing/severity/associated sxs/prior Treatment) HPI   Karla Ward is a 44 y.o. female who presents to the Emergency Department complaining of lower abdominal "pressure" and urinary frequency that began earlier today.  She reports feeling a sensation of "pressure" to her lower abdomen that is intermittent and feeling of urgency to void.  She also notes having the sensation that she is not completely emptying her bladder and reports only able to void small amounts.  She has not taken any medications for her symptoms.  Reports having a previous UTI, but cannot recall if the symptoms tonight are similar.  She denies fever, back pain, chills, N/V, vaginal pain or discharge and burning with urination.  Patient is currently menstruating.    Past Medical History  Diagnosis Date  . Hypertension   . GERD (gastroesophageal reflux disease)   . Vertigo    Past Surgical History  Procedure Laterality Date  . None to date  08/13/15   Family History  Problem Relation Age of Onset  . Colon cancer Neg Hx   . Throat cancer Father 7165   Social History  Substance Use Topics  . Smoking status: Never Smoker   . Smokeless tobacco: Never Used  . Alcohol Use: No   OB History    No data available     Review of Systems  Constitutional: Negative for fever, chills, activity change and appetite change.  Respiratory: Negative for chest tightness and shortness of breath.   Gastrointestinal: Negative for nausea, vomiting and diarrhea. Abdominal pain: abdominal "pressure"  Genitourinary: Positive for dysuria, urgency, frequency and vaginal bleeding (menses). Negative for flank pain, decreased urine volume, vaginal discharge, difficulty urinating and vaginal pain.   Musculoskeletal: Negative for back pain.  Skin: Negative for rash.  Neurological: Negative for weakness and numbness.  All other systems reviewed and are negative.     Allergies  Benadryl and Penicillins  Home Medications   Prior to Admission medications   Medication Sig Start Date End Date Taking? Authorizing Provider  amLODipine (NORVASC) 5 MG tablet Take 5 mg by mouth daily.    Historical Provider, MD  Meclizine HCl 25 MG CHEW Chew by mouth as needed.    Historical Provider, MD  medroxyPROGESTERone (DEPO-PROVERA) 150 MG/ML injection Inject 150 mg into the muscle every 3 (three) months.    Historical Provider, MD  ondansetron (ZOFRAN ODT) 4 MG disintegrating tablet Take 1 tablet (4 mg total) by mouth every 8 (eight) hours as needed for nausea or vomiting. 08/09/15   Mercedes Camprubi-Soms, PA-C  sucralfate (CARAFATE) 1 GM/10ML suspension Take 1 g by mouth 4 (four) times daily.  07/28/15   Historical Provider, MD   BP 151/62 mmHg  Pulse 99  Temp(Src) 98.5 F (36.9 C) (Oral)  Resp 16  Ht 5\' 4"  (1.626 m)  Wt 172 lb (78.019 kg)  BMI 29.51 kg/m2  SpO2 97%  LMP 08/12/2015 Physical Exam  Constitutional: She is oriented to person, place, and time. She appears well-developed and well-nourished. No distress.  HENT:  Head: Normocephalic and atraumatic.  Cardiovascular: Normal rate and regular rhythm.   Pulmonary/Chest: Effort normal and breath sounds normal. No respiratory distress.  Abdominal: Soft. Normal appearance. She exhibits no distension and no mass. There is no tenderness. There is  no rebound, no guarding and no CVA tenderness.  Musculoskeletal: Normal range of motion.  Neurological: She is alert and oriented to person, place, and time. She exhibits normal muscle tone. Coordination normal.  Skin: Skin is warm and dry.  Psychiatric: She has a normal mood and affect.  Nursing note and vitals reviewed.   ED Course  Procedures (including critical care time) Labs  Review Labs Reviewed  URINALYSIS, ROUTINE W REFLEX MICROSCOPIC (NOT AT [facility]) - Abnormal; Notable for the following:    Hgb urine dipstick LARGE (*)    Ketones, ur TRACE (*)    All other components within normal limits  URINE MICROSCOPIC-ADD ON - Abnormal; Notable for the following:    Squamous Epithelial / LPF FEW (*)    Bacteria, UA MANY (*)    All other components within normal limits  URINE CULTURE  PREGNANCY, URINE    Urine culture is pending  MDM   Final diagnoses:  Urinary tract infection without complication    Bladder scan performed, 120 cc urine retained.  Pt is well appearing, vitals stable.  No concerning sx's for pyelonephritis at this time.  abdominal exam is benign , since pt is having dysuria sx's with early onset, I will culture her urine and begin Macrobid, pyridium for symptom relief and she agrees to close PMD f/u or to return here for any worsening sx's.  She appears stable for d/c and agrees to plan   Pauline Aus, PA-C 08/15/15 0041  Cathren Laine, MD 08/15/15 [phone]

## 2015-08-14 NOTE — ED Notes (Signed)
Pt states she is having urinary frequency but is only able to void a small amount. Pt c/o lower abdominal pain. Denies dysuria, back pain, or n/v.

## 2015-08-15 LAB — URINALYSIS, ROUTINE W REFLEX MICROSCOPIC
BILIRUBIN URINE: NEGATIVE
Glucose, UA: NEGATIVE mg/dL
Leukocytes, UA: NEGATIVE
Nitrite: NEGATIVE
PH: 6 (ref 5.0–8.0)
Protein, ur: NEGATIVE mg/dL
SPECIFIC GRAVITY, URINE: 1.025 (ref 1.005–1.030)
Urobilinogen, UA: 1 mg/dL (ref 0.0–1.0)

## 2015-08-15 LAB — URINE MICROSCOPIC-ADD ON

## 2015-08-15 MED ORDER — NITROFURANTOIN MONOHYD MACRO 100 MG PO CAPS: 100.0000 mg | ORAL_CAPSULE | Freq: Two times a day (BID) | ORAL | Status: DC

## 2015-08-15 MED ORDER — PHENAZOPYRIDINE HCL 200 MG PO TABS: 200.0000 mg | ORAL_TABLET | Freq: Three times a day (TID) | ORAL | Status: DC

## 2015-08-15 MED ORDER — PHENAZOPYRIDINE HCL 100 MG PO TABS
200.0000 mg | ORAL_TABLET | Freq: Once | ORAL | Status: AC
  Administered 2015-08-15: 200 mg via ORAL
  Filled 2015-08-15: qty 2

## 2015-08-15 MED ORDER — NITROFURANTOIN MONOHYD MACRO 100 MG PO CAPS
100.0000 mg | ORAL_CAPSULE | Freq: Two times a day (BID) | ORAL | Status: DC
  Administered 2015-08-15: 100 mg via ORAL
  Filled 2015-08-15: qty 1

## 2015-08-15 NOTE — Discharge Instructions (Signed)

## 2015-08-15 NOTE — ED Notes (Signed)
Patient given discharge instruction, verbalized understand. Patient ambulatory out of the department.  

## 2015-08-17 LAB — URINE CULTURE

## 2015-08-24 ENCOUNTER — Ambulatory Visit (HOSPITAL_COMMUNITY)
Admission: RE | Admit: 2015-08-24 | Payer: Managed Care, Other (non HMO) | Source: Ambulatory Visit | Admitting: Gastroenterology

## 2015-08-24 ENCOUNTER — Encounter (HOSPITAL_COMMUNITY): Admission: RE | Payer: Self-pay | Source: Ambulatory Visit

## 2015-08-24 SURGERY — EGD (ESOPHAGOGASTRODUODENOSCOPY)
Anesthesia: Moderate Sedation

## 2015-08-24 NOTE — Progress Notes (Signed)
Pt canceled EGD OCT 24 BECAUSE SHE SAID HER PCP TOLD HER SHE DID NOT NEED IT.

## 2015-08-31 ENCOUNTER — Other Ambulatory Visit: Payer: Self-pay

## 2015-08-31 ENCOUNTER — Telehealth: Payer: Self-pay | Admitting: Gastroenterology

## 2015-08-31 DIAGNOSIS — K219 Gastro-esophageal reflux disease without esophagitis: Secondary | ICD-10-CM

## 2015-08-31 NOTE — Telephone Encounter (Signed)
Called pt LMOM to call office back 

## 2015-08-31 NOTE — Telephone Encounter (Signed)
Pt was seen by EG on 10/13 and was scheduled an EGD, but she cancelled it saying she was feeling better. She called today saying she is having problems again and wants to go ahead and schedule again. Please call her at [phone]

## 2015-09-02 ENCOUNTER — Encounter (HOSPITAL_COMMUNITY): Payer: Self-pay

## 2015-09-02 ENCOUNTER — Encounter (HOSPITAL_COMMUNITY)
Admission: RE | Disposition: A | Discharge status: Home / Self Care | Payer: Self-pay | Source: Ambulatory Visit | Attending: Gastroenterology

## 2015-09-02 ENCOUNTER — Ambulatory Visit (HOSPITAL_COMMUNITY)
Admission: RE | Admit: 2015-09-02 | Discharge: 2015-09-02 | Disposition: A | Discharge status: Home / Self Care | Payer: Managed Care, Other (non HMO) | Source: Ambulatory Visit | Attending: Gastroenterology | Admitting: Gastroenterology

## 2015-09-02 DIAGNOSIS — K219 Gastro-esophageal reflux disease without esophagitis: Secondary | ICD-10-CM | POA: Insufficient documentation

## 2015-09-02 DIAGNOSIS — I10 Essential (primary) hypertension: Secondary | ICD-10-CM | POA: Diagnosis not present

## 2015-09-02 DIAGNOSIS — Z79899 Other long term (current) drug therapy: Secondary | ICD-10-CM | POA: Insufficient documentation

## 2015-09-02 DIAGNOSIS — K222 Esophageal obstruction: Secondary | ICD-10-CM | POA: Diagnosis not present

## 2015-09-02 DIAGNOSIS — R1310 Dysphagia, unspecified: Secondary | ICD-10-CM | POA: Insufficient documentation

## 2015-09-02 DIAGNOSIS — K317 Polyp of stomach and duodenum: Secondary | ICD-10-CM | POA: Diagnosis not present

## 2015-09-02 DIAGNOSIS — R1013 Epigastric pain: Secondary | ICD-10-CM | POA: Diagnosis not present

## 2015-09-02 DIAGNOSIS — K2950 Unspecified chronic gastritis without bleeding: Secondary | ICD-10-CM | POA: Insufficient documentation

## 2015-09-02 HISTORY — PX: ESOPHAGOGASTRODUODENOSCOPY: SHX5428

## 2015-09-02 HISTORY — PX: SAVORY DILATION: SHX5439

## 2015-09-02 SURGERY — EGD (ESOPHAGOGASTRODUODENOSCOPY)
Anesthesia: Moderate Sedation

## 2015-09-02 MED ORDER — STERILE WATER FOR IRRIGATION IR SOLN
Status: DC | PRN
  Administered 2015-09-02: 13:00:00

## 2015-09-02 MED ORDER — MIDAZOLAM HCL 5 MG/5ML IJ SOLN
INTRAMUSCULAR | Status: DC | PRN
  Administered 2015-09-02 (×4): 2 mg via INTRAVENOUS

## 2015-09-02 MED ORDER — SODIUM CHLORIDE 0.9 % IV SOLN
INTRAVENOUS | Status: DC
  Administered 2015-09-02: 13:00:00 via INTRAVENOUS

## 2015-09-02 MED ORDER — MEPERIDINE HCL 100 MG/ML IJ SOLN
INTRAMUSCULAR | Status: DC | PRN
  Administered 2015-09-02 (×3): 25 mg via INTRAVENOUS
  Administered 2015-09-02: 50 mg via INTRAVENOUS

## 2015-09-02 MED ORDER — MIDAZOLAM HCL 5 MG/5ML IJ SOLN
INTRAMUSCULAR | Status: AC
  Filled 2015-09-02: qty 10

## 2015-09-02 MED ORDER — MINERAL OIL PO OIL
TOPICAL_OIL | ORAL | Status: AC
  Filled 2015-09-02: qty 30

## 2015-09-02 MED ORDER — LIDOCAINE VISCOUS 2 % MT SOLN
OROMUCOSAL | Status: DC | PRN
  Administered 2015-09-02: 4 mL via OROMUCOSAL

## 2015-09-02 MED ORDER — SODIUM CHLORIDE 0.9 % IJ SOLN
INTRAMUSCULAR | Status: AC
  Filled 2015-09-02: qty 3

## 2015-09-02 MED ORDER — PROMETHAZINE HCL 25 MG/ML IJ SOLN
INTRAMUSCULAR | Status: AC
  Filled 2015-09-02: qty 1

## 2015-09-02 MED ORDER — LIDOCAINE VISCOUS 2 % MT SOLN
OROMUCOSAL | Status: AC
  Filled 2015-09-02: qty 15

## 2015-09-02 MED ORDER — PROMETHAZINE HCL 25 MG/ML IJ SOLN
INTRAMUSCULAR | Status: DC | PRN
  Administered 2015-09-02: 12.5 mg via INTRAVENOUS

## 2015-09-02 MED ORDER — MEPERIDINE HCL 100 MG/ML IJ SOLN
INTRAMUSCULAR | Status: AC
  Filled 2015-09-02: qty 2

## 2015-09-02 NOTE — Discharge Instructions (Signed)
I dilated your esophagus. You have a stricture near the base of your esophagus. You have gastritis. I biopsied your stomach.   FOLLOW A LOW FAT DIET. SEE INFO BELOW.  CONTINUE OMEPRAZOLE. TAKE 30 MINUTES PRIOR TO YOUR FIRST MEAL.  YOUR BIOPSY RESULTS WILL BE AVAILABLE IN MY CHART AFTER NOV 3 AND MY OFFICE WILL CONTACT YOU IN 10-14 DAYS WITH YOUR RESULTS.   FOLLOW UP IN 3 MOS.   UPPER ENDOSCOPY AFTER CARE Read the instructions outlined below and refer to this sheet in the next week. These discharge instructions provide you with general information on caring for yourself after you leave the hospital. While your treatment has been planned according to the most current medical practices available, unavoidable complications occasionally occur. If you have any problems or questions after discharge, call DR. Jim Philemon, [phone].  ACTIVITY  You may resume your regular activity, but move at a slower pace for the next 24 hours.   Take frequent rest periods for the next 24 hours.   Walking will help get rid of the air and reduce the bloated feeling in your belly (abdomen).   No driving for 24 hours (because of the medicine (anesthesia) used during the test).   You may shower.   Do not sign any important legal documents or operate any machinery for 24 hours (because of the anesthesia used during the test).    NUTRITION  Drink plenty of fluids.   You may resume your normal diet as instructed by your doctor.   Begin with a light meal and progress to your normal diet. Heavy or fried foods are harder to digest and may make you feel sick to your stomach (nauseated).   Avoid alcoholic beverages for 24 hours or as instructed.    MEDICATIONS  You may resume your normal medications.   WHAT YOU CAN EXPECT TODAY  Some feelings of bloating in the abdomen.   Passage of more gas than usual.    IF YOU HAD A BIOPSY TAKEN DURING THE UPPER ENDOSCOPY:  Eat a soft diet IF YOU HAVE NAUSEA,  BLOATING, ABDOMINAL PAIN, OR VOMITING.    FINDING OUT THE RESULTS OF YOUR TEST Not all test results are available during your visit. DR. Darrick Penna WILL CALL YOU WITHIN 7 DAYS OF YOUR PROCEDUE WITH YOUR RESULTS. Do not assume everything is normal if you have not heard from DR. Starsha Morning IN ONE WEEK, CALL HER OFFICE AT [phone].  SEEK IMMEDIATE MEDICAL ATTENTION AND CALL THE OFFICE: [phone] IF:  You have more than a spotting of blood in your stool.   Your belly is swollen (abdominal distention).   You are nauseated or vomiting.   You have a temperature over 101F.   You have abdominal pain or discomfort that is severe or gets worse throughout the day.    Low-Fat Diet BREADS, CEREALS, PASTA, RICE, DRIED PEAS, AND BEANS These products are high in carbohydrates and most are low in fat. Therefore, they can be increased in the diet as substitutes for fatty foods. They too, however, contain calories and should not be eaten in excess. Cereals can be eaten for snacks as well as for breakfast.  Include foods that contain fiber (fruits, vegetables, whole grains, and legumes). Research shows that fiber may lower blood cholesterol levels, especially the water-soluble fiber found in fruits, vegetables, oat products, and legumes. FRUITS AND VEGETABLES It is good to eat fruits and vegetables. Besides being sources of fiber, both are rich in vitamins and some minerals. They  help you get the daily allowances of these nutrients. Fruits and vegetables can be used for snacks and desserts. MEATS Limit lean meat, chicken, [place], and fish to no more than 6 ounces per day. Beef, Pork, and Lamb Use lean cuts of beef, pork, and lamb. Lean cuts include:  Extra-lean ground beef.  Arm roast.  Sirloin tip.  Center-cut ham.  Round steak.  Loin chops.  Rump roast.  Tenderloin.  Trim all fat off the outside of meats before cooking. It is not necessary to severely decrease the intake of red meat, but lean  choices should be made. Lean meat is rich in protein and contains a highly absorbable form of iron. Premenopausal women, in particular, should avoid reducing lean red meat because this could increase the risk for low red blood cells (iron-deficiency anemia).  Chicken and [place] These are good sources of protein. The fat of poultry can be reduced by removing the skin and underlying fat layers before cooking. Chicken and [place] can be substituted for lean red meat in the diet. Poultry should not be fried or covered with high-fat sauces. Fish and Shellfish Fish is a good source of protein. Shellfish contain cholesterol, but they usually are low in saturated fatty acids. The preparation of fish is important. Like chicken and [place], they should not be fried or covered with high-fat sauces. EGGS Egg whites contain no fat or cholesterol. They can be eaten often. Try 1 to 2 egg whites instead of whole eggs in recipes or use egg substitutes that do not contain yolk.  MILK AND DAIRY PRODUCTS Use skim or 1% milk instead of 2% or whole milk. Decrease whole milk, natural, and processed cheeses. Use nonfat or low-fat (2%) cottage cheese or low-fat cheeses made from vegetable oils. Choose nonfat or low-fat (1 to 2%) yogurt. Experiment with evaporated skim milk in recipes that call for heavy cream. Substitute low-fat yogurt or low-fat cottage cheese for sour cream in dips and salad dressings. Have at least 2 servings of low-fat dairy products, such as 2 glasses of skim (or 1%) milk each day to help get your daily calcium intake.  FATS AND OILS Butterfat, lard, and beef fats are high in saturated fat and cholesterol. These should be avoided.Vegetable fats do not contain cholesterol. AVOID coconut oil, palm oil, and palm kernel oil, WHICH are very high in saturated fats. These should be limited. These fats are often used in bakery goods, processed foods, popcorn, oils, and nondairy creamers. Vegetable shortenings and  some peanut butters contain hydrogenated oils, which are also saturated fats. Read the labels on these foods and check for saturated vegetable oils.  Desirable liquid vegetable oils are corn oil, cottonseed oil, olive oil, canola oil, safflower oil, soybean oil, and sunflower oil. Peanut oil is not as good, but small amounts are acceptable. Buy a heart-healthy tub margarine that has no partially hydrogenated oils in the ingredients. AVOID Mayonnaise and salad dressings often are made from unsaturated fats.  OTHER EATING TIPS Snacks  Most sweets should be limited as snacks. They tend to be rich in calories and fats, and their caloric content outweighs their nutritional value. Some good choices in snacks are graham crackers, melba toast, soda crackers, bagels (no egg), English muffins, fruits, and vegetables. These snacks are preferable to snack crackers, [place] fries, and chips. Popcorn should be air-popped or cooked in small amounts of liquid vegetable oil.  Desserts Eat fruit, low-fat yogurt, and fruit ices instead of pastries, cake, and cookies. Sherbet,  angel food cake, gelatin dessert, frozen low-fat yogurt, or other frozen products that do not contain saturated fat (pure fruit juice bars, frozen ice pops) are also acceptable.   COOKING METHODS Choose those methods that use little or no fat. They include: Poaching.  Braising.  Steaming.  Grilling.  Baking.  Stir-frying.  Broiling.  Microwaving.  Foods can be cooked in a nonstick pan without added fat, or use a nonfat cooking spray in regular cookware. Limit fried foods and avoid frying in saturated fat. Add moisture to lean meats by using water, broth, cooking wines, and other nonfat or low-fat sauces along with the cooking methods mentioned above. Soups and stews should be chilled after cooking. The fat that forms on top after a few hours in the refrigerator should be skimmed off. When preparing meals, avoid using excess salt. Salt can  contribute to raising blood pressure in some people.  EATING AWAY FROM HOME Order entres, potatoes, and vegetables without sauces or butter. When meat exceeds the size of a deck of cards (3 to 4 ounces), the rest can be taken home for another meal. Choose vegetable or fruit salads and ask for low-calorie salad dressings to be served on the side. Use dressings sparingly. Limit high-fat toppings, such as bacon, crumbled eggs, cheese, sunflower seeds, and olives. Ask for heart-healthy tub margarine instead of butter.   Gastritis  Gastritis is an inflammation (the body's way of reacting to injury and/or infection) of the stomach. It is often caused by viral or bacterial (germ) infections. It can also be caused BY ASPIRIN, BC/GOODY POWDER'S, (IBUPROFEN) MOTRIN, OR ALEVE (NAPROXEN), chemicals (including alcohol), SPICY FOODS, and medications. This illness may be associated with generalized malaise (feeling tired, not well), UPPER ABDOMINAL STOMACH cramps, and fever. One common bacterial cause of gastritis is an organism known as H. Pylori. This can be treated with antibiotics.   GERD   SYMPTOMS Common symptoms of GERD are heartburn (burning in your chest). This is worse when lying down or bending over. It may also cause belching and indigestion. Some of the things which make GERD worse are:  Increased weight pushes on stomach making acid rise more easily.   Smoking markedly increases acid production.   Alcohol decreases lower esophageal sphincter pressure (valve between stomach and esophagus), allowing acid from stomach into esophagus.   Late evening meals and going to bed with a full stomach increases pressure.   HOME CARE INSTRUCTIONS  Try to achieve and maintain an ideal body weight.   Avoid drinking alcoholic beverages.   DO NOT smokE.   Do not wear tight clothing around your chest or stomach.   Eat smaller meals and eat more frequently. This keeps your stomach from getting too  full. Eat slowly.   Do not lie down for 2 or 3 hours after eating. Do not eat or drink anything 1 to 2 hours before going to bed.   Avoid caffeine beverages (colas, coffee, cocoa, tea), fatty foods, citrus fruits and all other foods and drinks that contain acid and that seem to increase the problems.   Avoid bending over, especially after eating OR STRAINING. Anything that increases the pressure in your belly increases the amount of acid that may be pushed up into your esophagus.    ESOPHAGEAL STRICTURE  Esophageal strictures can be caused by stomach acid backing up into the tube that carries food from the mouth down to the stomach (lower esophagus).  TREATMENT There are a number of  medicines used  to treat reflux/stricture, including: Antacids.  Proton-pump inhibitors: OMEPRAZOLE   HOME CARE INSTRUCTIONS Eat 2-3 hours before going to bed.  Try to reach and maintain a healthy weight.  Do not eat just a few very large meals. Instead, eat 4 TO 6 smaller meals throughout the day.  Try to identify foods and beverages that make your symptoms worse, and avoid these.  Avoid tight clothing.  Do not exercise right after eating.

## 2015-09-02 NOTE — H&P (Signed)
Primary Care Physician:  Fredirick MaudlinHAWKINS,EDWARD L, MD Primary Gastroenterologist:  Dr. Darrick PennaFields  Pre-Procedure History & Physical: HPI:  Karla Ward is a 44 y.o. female here for DYSPHAGIA.  Past Medical History  Diagnosis Date  . Hypertension   . GERD (gastroesophageal reflux disease)   . Vertigo     Past Surgical History  Procedure Laterality Date  . None to date  08/13/15    Prior to Admission medications   Medication Sig Start Date End Date Taking? Authorizing Provider  amLODipine (NORVASC) 5 MG tablet Take 5 mg by mouth daily.   Yes Historical Provider, MD  Meclizine HCl 25 MG CHEW Chew 25 mg by mouth as needed (for dizziness).    Yes Historical Provider, MD  medroxyPROGESTERone (DEPO-PROVERA) 150 MG/ML injection Inject 150 mg into the muscle every 3 (three) months.   Yes Historical Provider, MD  omeprazole (PRILOSEC) 20 MG capsule Take 20 mg by mouth daily.   Yes Historical Provider, MD  ondansetron (ZOFRAN ODT) 4 MG disintegrating tablet Take 1 tablet (4 mg total) by mouth every 8 (eight) hours as needed for nausea or vomiting. 08/09/15  Yes Mercedes Camprubi-Soms, PA-C  predniSONE (DELTASONE) 10 MG tablet Take 1 tablet by mouth daily. 08/26/15  Yes Historical Provider, MD  nitrofurantoin, macrocrystal-monohydrate, (MACROBID) 100 MG capsule Take 1 capsule (100 mg total) by mouth 2 (two) times daily. For 7 days Patient not taking: Reported on 09/01/2015 08/15/15   Tammy Triplett, PA-C  phenazopyridine (PYRIDIUM) 200 MG tablet Take 1 tablet (200 mg total) by mouth 3 (three) times daily. Patient not taking: Reported on 09/01/2015 08/15/15   Tammy Triplett, PA-C    Allergies as of 08/31/2015 - Review Complete 08/14/2015  Allergen Reaction Noted  . Benadryl [diphenhydramine hcl (sleep)]  12/26/2014  . Penicillins Hives 11/19/2012    Family History  Problem Relation Age of Onset  . Colon cancer Neg Hx   . Throat cancer Father 1863    Social History   Social History  . Marital  Status: Married    Spouse Name: N/A  . Number of Children: N/A  . Years of Education: N/A   Occupational History  . Not on file.   Social History Main Topics  . Smoking status: Never Smoker   . Smokeless tobacco: Never Used  . Alcohol Use: No  . Drug Use: No  . Sexual Activity: Yes    Birth Control/ Protection: Injection   Other Topics Concern  . Not on file   Social History Narrative    Review of Systems: See HPI, otherwise negative ROS   Physical Exam: LMP 08/12/2015 General:   Alert,  pleasant and cooperative in NAD Head:  Normocephalic and atraumatic. Neck:  Supple; Lungs:  Clear throughout to auscultation.    Heart:  Regular rate and rhythm. Abdomen:  Soft, nontender and nondistended. Normal bowel sounds, without guarding, and without rebound.   Neurologic:  Alert and  oriented x4;  grossly normal neurologically.  Impression/Plan:    DYSPHAGIA  PLAN:  EGD/DIL TODAY

## 2015-09-02 NOTE — Op Note (Signed)
[facility] [address] [place], [id number]   ENDOSCOPY PROCEDURE REPORT  PATIENT: Karla BlaseRobertson, [name]  MR#: [id number] BIRTHDATE: Mar 29, 1971 , 44  yrs. old GENDER: female  ENDOSCOPIST: West BaliSandi L Willet Schleifer, MD REFFERED LK:GMWNUUBY:Edward Juanetta GoslingHawkins, M.D.  PROCEDURE DATE:  09/02/2015 PROCEDURE:   EGD with biopsy and EGD with dilatation over guidewire   INDICATIONS:1.  dysphagia.   2.  dyspepsia. MEDICATIONS: Promethazine (Phenergan) 25 mg IV, Meperidine (Demerol) 125 mg IV, and Versed 8 mg IV TOPICAL ANESTHETIC: Viscous Xylocaine  DESCRIPTION OF PROCEDURE:   After the risks benefits and alternatives of the procedure were thoroughly explained, informed consent was obtained.  The [id number] ([id number]  endoscope was introduced through the mouth and advanced to the second portion of the duodenum. The instrument was slowly withdrawn as the mucosa was carefully examined.  Prior to withdrawal of the scope, the guidwire was placed.  The esophagus was dilated successfully.  The patient was recovered in endoscopy and discharged home in satisfactory condition. Estimated blood loss is zero unless otherwise noted in this procedure report.   ESOPHAGUS: A stricture was found at the gastroesophageal junction. The stenosis was traversable with the endoscope.   STOMACH: Mild non-erosive gastritis (inflammation) was found in the gastric antrum.  Multiple biopsies were performed using cold forceps. DUODENUM: The duodenal mucosa showed no abnormalities in the bulb and second portion of the duodenum.   Dilation was then performed at the gastroesphageal junction  Dilator: Savary over guidewire Size(s): 14-16 mm Heme: yes TRACE  COMPLICATIONS: There were no immediate complications.  ENDOSCOPIC IMPRESSION: 1.   Stricture at the gastroesophageal junction 2.   MILD Non-erosive gastritis  RECOMMENDATIONS: FOLLOW A LOW FAT DIET. OMEPRAZOLE 30 MINS PRIOR TO FIRST MEAL. AWAIT BIOPSY RESULTS. FOLLOW  UP IN 3 MOS.     _______________________________ eSignedWest Bali:  Huntington Leverich L Jaylee Lantry, MD 09/02/2015 7:14 PM   CPT CODES: ICD CODES:  The ICD and CPT codes recommended by this software are interpretations from the data that the clinical staff has captured with the software.  The verification of the translation of this report to the ICD and CPT codes and modifiers is the sole responsibility of the health care institution and practicing physician where this report was generated.  [employer] will not be held responsible for the validity of the ICD and CPT codes included on this report.  AMA assumes no liability for data contained or not contained herein. CPT is a Publishing rights managerregistered trademark of the [employer].

## 2015-09-03 NOTE — Telephone Encounter (Signed)
Pt had procedure 09/02/2015 with [facility]

## 2015-09-08 ENCOUNTER — Encounter (HOSPITAL_COMMUNITY): Payer: Self-pay | Admitting: Gastroenterology

## 2015-09-12 ENCOUNTER — Telehealth: Payer: Self-pay | Admitting: Gastroenterology

## 2015-09-12 NOTE — Telephone Encounter (Signed)
Please call pt. HER stomach Bx shows mild gastritis.   FOLLOW A LOW FAT DIET.   CONTINUE OMEPRAZOLE. TAKE 30 MINUTES PRIOR TO YOUR FIRST MEAL.  FOLLOW UP IN 3 MOS E15 DYSPHAGIA.

## 2015-09-14 ENCOUNTER — Telehealth: Payer: Self-pay | Admitting: Gastroenterology

## 2015-09-14 NOTE — Telephone Encounter (Signed)
Should be fine. That is 3 months from OV with Wynne DustEric Gill, NP.

## 2015-09-14 NOTE — Telephone Encounter (Signed)
Pt is aware of results. 

## 2015-09-14 NOTE — Telephone Encounter (Signed)
PT HAS APPT WITH EG IN 10/2014   IS THIS OK?

## 2015-09-14 NOTE — Telephone Encounter (Signed)
Pt was calling to see if her procedure results were available. Please call [phone]

## 2015-09-14 NOTE — Telephone Encounter (Signed)
See 09/12/2015 result note.

## 2015-09-16 ENCOUNTER — Telehealth: Payer: Self-pay

## 2015-09-16 NOTE — Telephone Encounter (Signed)
Left the full message on VM.  

## 2015-09-16 NOTE — Telephone Encounter (Signed)
PLEASE CALL PT. IF SHE IS HAVING CHEST PAIN SHE CAN TRY TO MANAGE AT HOME OR GO TO ED IF IT GETS REALLY BAD. SHE SHOULD CHANGE HER DIET TO A FULL LIQUID DIET FOR 3 DAYS. GO TO [email or web address] FOR DIET HANDOUT. INCREASE PRILOSEC TO 30 MINS BEFORE BREAKFAST AND SUPPER FOR 2 WEEKS THEN GO BACK DOWN TO ONCE DAILY.

## 2015-09-16 NOTE — Telephone Encounter (Signed)
Pt called and said she has been having some chest pain since Monday afternoon.  She describes it as being in the center of her chest and to the right side. It is more constant but not real bad. She feels like she needs to burp some also. She is taking the Prilosec qd. I told her if the pain gets worse to go to the ED. She said it has not been bad, she is just aware of the constant pain. Please advise!

## 2015-09-17 ENCOUNTER — Ambulatory Visit (INDEPENDENT_AMBULATORY_CARE_PROVIDER_SITE_OTHER): Payer: Managed Care, Other (non HMO) | Admitting: Otolaryngology

## 2015-09-17 ENCOUNTER — Emergency Department (HOSPITAL_COMMUNITY): Payer: Managed Care, Other (non HMO)

## 2015-09-17 ENCOUNTER — Encounter (HOSPITAL_COMMUNITY): Payer: Self-pay

## 2015-09-17 ENCOUNTER — Emergency Department (HOSPITAL_COMMUNITY)
Admission: EM | Admit: 2015-09-17 | Discharge: 2015-09-17 | Disposition: A | Discharge status: Home / Self Care | Payer: Managed Care, Other (non HMO) | Attending: Emergency Medicine | Admitting: Emergency Medicine

## 2015-09-17 DIAGNOSIS — R109 Unspecified abdominal pain: Secondary | ICD-10-CM | POA: Insufficient documentation

## 2015-09-17 DIAGNOSIS — R0789 Other chest pain: Secondary | ICD-10-CM | POA: Diagnosis not present

## 2015-09-17 DIAGNOSIS — R0602 Shortness of breath: Secondary | ICD-10-CM | POA: Diagnosis not present

## 2015-09-17 DIAGNOSIS — K219 Gastro-esophageal reflux disease without esophagitis: Secondary | ICD-10-CM | POA: Diagnosis not present

## 2015-09-17 DIAGNOSIS — Z880 Allergy status to penicillin: Secondary | ICD-10-CM | POA: Insufficient documentation

## 2015-09-17 DIAGNOSIS — R05 Cough: Secondary | ICD-10-CM | POA: Diagnosis not present

## 2015-09-17 DIAGNOSIS — I10 Essential (primary) hypertension: Secondary | ICD-10-CM | POA: Diagnosis not present

## 2015-09-17 DIAGNOSIS — R079 Chest pain, unspecified: Secondary | ICD-10-CM | POA: Diagnosis present

## 2015-09-17 HISTORY — DX: Gastritis, unspecified, without bleeding: K29.70

## 2015-09-17 LAB — COMPREHENSIVE METABOLIC PANEL
ALK PHOS: 56 U/L (ref 38–126)
ALT: 14 U/L (ref 14–54)
AST: 19 U/L (ref 15–41)
Albumin: 4.2 g/dL (ref 3.5–5.0)
Anion gap: 9 (ref 5–15)
BUN: 7 mg/dL (ref 6–20)
CALCIUM: 9.3 mg/dL (ref 8.9–10.3)
CO2: 21 mmol/L — ABNORMAL LOW (ref 22–32)
Chloride: 107 mmol/L (ref 101–111)
Creatinine, Ser: 0.93 mg/dL (ref 0.44–1.00)
Glucose, Bld: 80 mg/dL (ref 65–99)
Potassium: 3.8 mmol/L (ref 3.5–5.1)
Sodium: 137 mmol/L (ref 135–145)
Total Bilirubin: 1.2 mg/dL (ref 0.3–1.2)
Total Protein: 8 g/dL (ref 6.5–8.1)

## 2015-09-17 LAB — CBC WITH DIFFERENTIAL/PLATELET
BASOS ABS: 0 10*3/uL (ref 0.0–0.1)
BASOS PCT: 0 %
EOS ABS: 0.2 10*3/uL (ref 0.0–0.7)
EOS PCT: 2 %
HCT: 40.5 % (ref 36.0–46.0)
Hemoglobin: 13.7 g/dL (ref 12.0–15.0)
Lymphocytes Relative: 22 %
Lymphs Abs: 1.7 10*3/uL (ref 0.7–4.0)
MCH: 29.1 pg (ref 26.0–34.0)
MCHC: 33.8 g/dL (ref 30.0–36.0)
MCV: 86 fL (ref 78.0–100.0)
Monocytes Absolute: 0.4 10*3/uL (ref 0.1–1.0)
Monocytes Relative: 5 %
Neutro Abs: 5.6 10*3/uL (ref 1.7–7.7)
Neutrophils Relative %: 71 %
PLATELETS: 322 10*3/uL (ref 150–400)
RBC: 4.71 MIL/uL (ref 3.87–5.11)
RDW: 13.9 % (ref 11.5–15.5)
WBC: 7.9 10*3/uL (ref 4.0–10.5)

## 2015-09-17 LAB — LIPASE, BLOOD: LIPASE: 26 U/L (ref 11–51)

## 2015-09-17 MED ORDER — SODIUM CHLORIDE 0.9 % IV BOLUS (SEPSIS)
1000.0000 mL | Freq: Once | INTRAVENOUS | Status: AC
  Administered 2015-09-17: 1000 mL via INTRAVENOUS

## 2015-09-17 MED ORDER — SUCRALFATE 1 G PO TABS: 1.0000 g | ORAL_TABLET | Freq: Three times a day (TID) | ORAL | Status: DC

## 2015-09-17 MED ORDER — GI COCKTAIL ~~LOC~~
30.0000 mL | Freq: Once | ORAL | Status: AC
  Administered 2015-09-17: 30 mL via ORAL
  Filled 2015-09-17: qty 30

## 2015-09-17 MED ORDER — PANTOPRAZOLE SODIUM 40 MG IV SOLR
40.0000 mg | Freq: Once | INTRAVENOUS | Status: AC
  Administered 2015-09-17: 40 mg via INTRAVENOUS
  Filled 2015-09-17: qty 40

## 2015-09-17 NOTE — ED Provider Notes (Signed)
CSN: [id number]     Arrival date & time 09/17/15  1047 History  By signing my name below, I, Ronney Lion, attest that this documentation has been prepared under the direction and in the presence of Gerhard Munch, MD. Electronically Signed: Ronney Lion, ED Scribe. 09/17/2015. 12:30 PM.    Chief Complaint  Patient presents with  . Chest Pain   The history is provided by the patient. No language interpreter was used.    HPI Comments: Karla Ward is a 44 y.o. female with a history of HTN, GERD, and gastritis, who presents to the Emergency Department complaining of moderate, sore right-sided chest pain that began 2 days ago. She endorses a slight cough and notes "a little" SOB. She states she had an endoscopy 2 weeks ago and had been doing well since until 2 days ago. Patient states she saw her GI doctor Dr. Jettie Booze yesterday and was told she had GERD. She was prescribed a liquid diet, as well as 60 mg prilosec BID. Patient states she had come to the ED with complaints of chest pain before and was diagnosed with GERD although she has never had right-side-specific chest pain like this before. Dr. Darrick Penna had sent her here to r/o any other possible causes of chest pain. 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 She states she had a gastric tissue biopsy earlier this week which showed gastritis. She states she is otherwise healthy. She denies a history of DM or any cardiac conditions. She denies vomiting, nausea, swelling, or weight changes.   Past Medical History  Diagnosis Date  . Hypertension   . GERD (gastroesophageal reflux disease)   . Vertigo   . Gastritis    Past Surgical History  Procedure Laterality Date  . None to date  08/13/15  . Esophagogastroduodenoscopy N/A 09/02/2015    Procedure: ESOPHAGOGASTRODUODENOSCOPY (EGD);  Surgeon: West Bali, MD;  Location: [facility];  Service: Endoscopy;  Laterality: N/A;  1345  . Savory  dilation  09/02/2015    Procedure: SAVORY DILATION;  Surgeon: West Bali, MD;  Location: [facility];  Service: Endoscopy;;   Family History  Problem Relation Age of Onset  . Colon cancer Neg Hx   . Throat cancer Father 23   Social History  Substance Use Topics  . Smoking status: Never Smoker   . Smokeless tobacco: Never Used  . Alcohol Use: No   OB History    No data available     Review of Systems  Constitutional: Negative for unexpected weight change.       Per HPI, otherwise negative  HENT:       Per HPI, otherwise negative  Respiratory: Positive for cough and shortness of breath.        Per HPI, otherwise negative  Cardiovascular: Positive for chest pain. Negative for leg swelling.       Per HPI, otherwise negative  Gastrointestinal: Negative for nausea and vomiting.  Endocrine:       Negative aside from HPI  Genitourinary:       Neg aside from HPI   Musculoskeletal:       Per HPI, otherwise negative  Skin: Negative.   Neurological: Negative for syncope.      Allergies  Benadryl and Penicillins  Home Medications   Prior to Admission medications   Medication Sig Start Date End Date Taking? Authorizing Provider  amLODipine (NORVASC) 5 MG tablet  Take 5 mg by mouth daily.    Historical Provider, MD  Meclizine HCl 25 MG CHEW Chew 25 mg by mouth as needed (for dizziness).     Historical Provider, MD  medroxyPROGESTERone (DEPO-PROVERA) 150 MG/ML injection Inject 150 mg into the muscle every 3 (three) months.    Historical Provider, MD  omeprazole (PRILOSEC) 20 MG capsule Take 20 mg by mouth daily.    Historical Provider, MD  ondansetron (ZOFRAN ODT) 4 MG disintegrating tablet Take 1 tablet (4 mg total) by mouth every 8 (eight) hours as needed for nausea or vomiting. 08/09/15   Mercedes Camprubi-Soms, PA-C  predniSONE (DELTASONE) 10 MG tablet Take 1 tablet by mouth daily. 08/26/15   Historical Provider, MD   BP 135/91 mmHg  Pulse 78  Temp(Src) 98.2 F (36.8  C) (Oral)  Resp 18  Ht  (1.626 m)  Wt 170 lb (77.111 kg)  BMI 29.17 kg/m2  SpO2 100%  LMP 08/19/2015 Physical Exam  Constitutional: She is oriented to person, place, and time. She appears well-developed and well-nourished. No distress.  HENT:  Head: Normocephalic and atraumatic.  Eyes: Conjunctivae and EOM are normal.  Cardiovascular: Normal rate and regular rhythm.   Pulmonary/Chest: Effort normal and breath sounds normal. No stridor. No respiratory distress.  Abdominal: She exhibits no distension.  Musculoskeletal: She exhibits no edema.  Neurological: She is alert and oriented to person, place, and time. No cranial nerve deficit.  Skin: Skin is warm and dry.  Psychiatric: She has a normal mood and affect.  Nursing note and vitals reviewed.   ED Course  Procedures (including critical care time)  DIAGNOSTIC STUDIES: Oxygen Saturation is 100% on RA, normal by my interpretation.    COORDINATION OF CARE: 11:25 AM - Discussed treatment plan with pt at bedside which includes diagnostic testing. Pt verbalized understanding and agreed to plan.   Labs Review Labs Reviewed  COMPREHENSIVE METABOLIC PANEL - Abnormal; Notable for the following:    CO2 21 (*)    All other components within normal limits  LIPASE, BLOOD  CBC WITH DIFFERENTIAL/PLATELET    Imaging Review Dg Chest 2 View  09/17/2015  CLINICAL DATA:  Chest pain for 2 days. EXAM: CHEST  2 VIEW COMPARISON:  July 17, 2015. FINDINGS: The heart size and mediastinal contours are within normal limits. Both lungs are clear. No pneumothorax or pleural effusion is noted. The visualized skeletal structures are unremarkable. IMPRESSION: No active cardiopulmonary disease. Electronically Signed   By: Lupita Raider, M.D.   On: 09/17/2015 12:24   I have personally reviewed and evaluated these images and lab results as part of my medical decision-making.   EKG Interpretation   Date/Time:  Thursday September 17 2015  10:59:30 EST Ventricular Rate:  73 PR Interval:  139 QRS Duration: 81 QT Interval:  390 QTC Calculation: 430 R Axis:   48 Text Interpretation:  Sinus rhythm Sinus rhythm Normal ECG Confirmed by  Gerhard Munch  MD ([id number]) on 09/17/2015 11:24:51 AM     12:57 PM Patient in no distress.  States that she feels better. We discussed all findings, and the patient will follow-up with gastroenterology.  She had one episode of tachycardia, states that she felt very anxious about her condition, but after calming down, has no ongoing anxiety, and had no left sided chest pain throughout. MDM   I personally performed the services described in this documentation, which was scribed in my presence. The recorded information has been reviewed and is accurate.  Patient presents with concern of ongoing right-sided abdominal pain. Patient history of gastritis, recent endoscopy Now here, no evidence for pneumothorax, he will mediastinum, pneumomediastinum, or other acute new pathology. Patient improved here with GI cocktail, IV PPI. Patient was discharged stable condition with continued, similar medication to follow-up with primary care, and GI.  Gerhard Munchobert Sarely Stracener, MD 09/17/15 [phone]

## 2015-09-17 NOTE — Telephone Encounter (Signed)
LATE ENTRY: Pt came by the office at closing yesterday and was informed also and told if her chest pain worsens to go to the ED.

## 2015-09-17 NOTE — ED Notes (Signed)
MD at bedside. 

## 2015-09-17 NOTE — ED Notes (Signed)
Pt reports had endoscopy 2 weeks ago and was doing fine.  Reports 2 days ago started having some r sided chest pain.  Reports she called Dr. Darrick PennaFields and she suggested she be on a liquid diet and take prilosec for a couple of days.  Pt says still having chest pain and wants to be evaluated.   Reports "little" sob.  Pt says pain is worse with deep breaths.

## 2015-09-17 NOTE — Discharge Instructions (Signed)
As discussed, your evaluation today has been largely reassuring.  But, it is important that you monitor your condition carefully, and do not hesitate to return to the ED if you develop new, or concerning changes in your condition. ? ?Otherwise, please follow-up with your physician for appropriate ongoing care. ? ?

## 2015-09-17 NOTE — ED Notes (Signed)
PT stated she felt her heart racing at this time but denied any chest pain and heart rate noted to be 160. EKG performed and given to MD at this time. Nursing at bedside used calming techniques and tachycardia subsided within 3 minutes and pt stated relief and denied need for any anti-anxiety medication at this time. MD eval at 1137 with HR of 103.

## 2015-09-18 ENCOUNTER — Telehealth: Payer: Self-pay | Admitting: Gastroenterology

## 2015-09-18 NOTE — Telephone Encounter (Signed)
REVIEWED-NO ADDITIONAL RECOMMENDATIONS. 

## 2015-09-18 NOTE — Telephone Encounter (Signed)
Patient called today saying that she was seen in the ED yesterday and was told that SF wanted her seen in the office in the next week. I made her an OV with EG on Tuesday at 8am

## 2015-09-18 NOTE — Telephone Encounter (Signed)
To Dr. Fields. 

## 2015-09-18 NOTE — Telephone Encounter (Signed)
Noted! Routing to Dr. Darrick PennaFields for FYI.

## 2015-09-22 ENCOUNTER — Ambulatory Visit (INDEPENDENT_AMBULATORY_CARE_PROVIDER_SITE_OTHER): Payer: Managed Care, Other (non HMO) | Admitting: Nurse Practitioner

## 2015-09-22 ENCOUNTER — Encounter: Payer: Self-pay | Admitting: Nurse Practitioner

## 2015-09-22 VITALS — BP 143/86 | HR 76 | Temp 97.5°F | Ht 64.0 in | Wt 167.0 lb

## 2015-09-22 DIAGNOSIS — K219 Gastro-esophageal reflux disease without esophagitis: Secondary | ICD-10-CM | POA: Diagnosis not present

## 2015-09-22 NOTE — Assessment & Plan Note (Signed)
Symptoms are quite improved on Prilosec twice a day. She is no further GERD symptoms since her emergency room visit. Recommend she continue the twice a day dosing for the full 2 weeks and return to once daily dosing 30 minutes before breakfast. Discussed food triggers, NSAIDs, and offending foods such as so does and advised that she avoid these to prevent worsening symptoms. Discussed that she will need to identify what foods trigger symptoms for her and avoid them. Return for follow-up in 6 months or sooner if needed for worsening symptoms.

## 2015-09-22 NOTE — Progress Notes (Signed)
CC'ED TO PCP 

## 2015-09-22 NOTE — Progress Notes (Signed)
Referring Provider: Kari Baars, MD Primary Care Physician:  [name], MD Primary GI:  Dr. Darrick Penna  Chief Complaint  Patient presents with  . Follow-up    HPI:   44 year old female presents for follow-up status post endoscopy and emergency room visit. She is last seen in our office 08/13/2015 with dysphagia and foreign body sensation although she was able to swallow and eat and breathe. She is referred for endoscopy which is initially canceled because she was told by her PCP was not necessary, however she rescheduled because she began having symptoms again. 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 She is on low-fat diet, omeprazole 30 minutes before breakfast and recommended for follow-up. She presented the emergency room on 09/17/2015 complaining of chest pain after her endoscopy approximately 1 week post procedure. All other causes of pathology were ruled out and she was told to take her omeprazole twice a day for 2 weeks and return to once a day.  Today she states she's doing better. No further GERD symptoms since the ER. Still has some abnormal sensations in her esophagus butit isn't as bothersome. Has changed her diet habits, eating smaller meals and not eating after 7 pm. This has also helped her lose some weight intentionally as well. Denies abdominal pain, N/V. No further chest pain. Denies hematochezia and melena. Avids NSAIDs and ASA powders. Minimal carbonated beverage intake. Denies further chest pain, dyspnea, dizziness, lightheadedness, syncope, near syncope. Denies any other upper or lower GI symptoms.  Past Medical History  Diagnosis Date  . Hypertension   . GERD (gastroesophageal reflux disease)   . Vertigo   . Gastritis     Past Surgical History  Procedure Laterality Date  . None to date  08/13/15  . Esophagogastroduodenoscopy N/A 09/02/2015   [id number] non-erosive gastritis/stricture at the gastroesophageal junction   . Savory dilation  09/02/2015    Procedure: SAVORY DILATION;  Surgeon: West Bali, MD;  Location: [facility];  Service: Endoscopy;;    Current Outpatient Prescriptions  Medication Sig Dispense Refill  . amLODipine (NORVASC) 5 MG tablet Take 5 mg by mouth daily.    . Meclizine HCl 25 MG CHEW Chew 25 mg by mouth as needed (for dizziness).     . medroxyPROGESTERone (DEPO-PROVERA) 150 MG/ML injection Inject 150 mg into the muscle every 3 (three) months.    Marland Kitchen omeprazole (PRILOSEC) 20 MG capsule Take 20 mg by mouth 2 (two) times daily before a meal.     . ondansetron (ZOFRAN ODT) 4 MG disintegrating tablet Take 1 tablet (4 mg total) by mouth every 8 (eight) hours as needed for nausea or vomiting. 15 tablet 0  . sucralfate (CARAFATE) 1 G tablet Take 1 tablet (1 g total) by mouth 4 (four) times daily -  with meals and at bedtime. (Patient not taking: Reported on 09/22/2015) 21 tablet 0  . [DISCONTINUED] propranolol (INDERAL) 10 MG tablet Take 1 tablet (10 mg total) by mouth as needed. (Patient not taking: Reported on 07/11/2015) 30 tablet 3   No current facility-administered medications for this visit.    Allergies as of 09/22/2015 - Review Complete 09/22/2015  Allergen Reaction Noted  . Benadryl [diphenhydramine hcl (sleep)]  12/26/2014  . Penicillins Hives 11/19/2012    Family History  Problem Relation Age of Onset  . Colon cancer Neg Hx   . Throat cancer Father 63    Social History  Social History  . Marital Status: Married    Spouse Name: N/A  . Number of Children: N/A  . Years of Education: N/A   Social History Main Topics  . Smoking status: Never Smoker   . Smokeless tobacco: Never Used  . Alcohol Use: No  . Drug Use: No  . Sexual Activity: Yes    Birth Control/ Protection: Injection   Other Topics Concern  . None   Social History Narrative    Review of Systems: 10-point ROS negative  except as per HPI   Physical Exam: BP 143/86 mmHg  Pulse 76  Temp(Src) 97.5 F (36.4 C)  Ht 5\' 4"  (1.626 m)  Wt 167 lb (75.751 kg)  BMI 28.65 kg/m2  LMP 08/19/2015 General:   Alert and oriented. Pleasant and cooperative. Well-nourished and well-developed.  Head:  Normocephalic and atraumatic. Cardiovascular:  S1, S2 present without murmurs appreciated. Normal pulses noted. Extremities without clubbing or edema. Respiratory:  Clear to auscultation bilaterally. No wheezes, rales, or rhonchi. No distress.  Gastrointestinal:  +BS, soft, non-tender and non-distended. No HSM noted. No guarding or rebound. No masses appreciated.  Rectal:  Deferred  Neurologic:  Alert and oriented x4;  grossly normal neurologically. Psych:  Alert and cooperative. Normal mood and affect.    09/22/2015 8:26 AM

## 2015-09-22 NOTE — Patient Instructions (Addendum)
1. Continue taking Prilosec twice a day for the full 2 weeks. 2. After that, return to Prilosec once a day 30 minutes before breakfast. 3. Continue your changes and dietary habits including eating smaller meals, eating slowly, and not eating within 3-4 hours at bedtime. 4. Try to identify and avoid trigger foods that make her symptoms worse. 5. Return for follow-up in 6 months or sooner if needed for worsening symptoms.    Gastroesophageal Reflux Disease, Adult Normally, food travels down the esophagus and stays in the stomach to be digested. However, when a person has gastroesophageal reflux disease (GERD), food and stomach acid move back up into the esophagus. When this happens, the esophagus becomes sore and inflamed. Over time, GERD can create small holes (ulcers) in the lining of the esophagus.  CAUSES This condition is caused by a problem with the muscle between the esophagus and the stomach (lower esophageal sphincter, or LES). Normally, the LES muscle closes after food passes through the esophagus to the stomach. When the LES is weakened or abnormal, it does not close properly, and that allows food and stomach acid to go back up into the esophagus. The LES can be weakened by certain dietary substances, medicines, and medical conditions, including:  Tobacco use.  Pregnancy.  Having a hiatal hernia.  Heavy alcohol use.  Certain foods and beverages, such as coffee, chocolate, onions, and peppermint. RISK FACTORS This condition is more likely to develop in:  People who have an increased body weight.  People who have connective tissue disorders.  People who use NSAID medicines. SYMPTOMS Symptoms of this condition include:  Heartburn.  Difficult or painful swallowing.  The feeling of having a lump in the throat.  Abitter taste in the mouth.  Bad breath.  Having a large amount of saliva.  Having an upset or bloated stomach.  Belching.  Chest pain.  Shortness of  breath or wheezing.  Ongoing (chronic) cough or a night-time cough.  Wearing away of tooth enamel.  Weight loss. Different conditions can cause chest pain. Make sure to see your health care provider if you experience chest pain. DIAGNOSIS Your health care provider will take a medical history and perform a physical exam. To determine if you have mild or severe GERD, your health care provider may also monitor how you respond to treatment. You may also have other tests, including:  An endoscopy toexamine your stomach and esophagus with a small camera.  A test thatmeasures the acidity level in your esophagus.  A test thatmeasures how much pressure is on your esophagus.  A barium swallow or modified barium swallow to show the shape, size, and functioning of your esophagus. TREATMENT The goal of treatment is to help relieve your symptoms and to prevent complications. Treatment for this condition may vary depending on how severe your symptoms are. Your health care provider may recommend:  Changes to your diet.  Medicine.  Surgery. HOME CARE INSTRUCTIONS Diet  Follow a diet as recommended by your health care provider. This may involve avoiding foods and drinks such as:  Coffee and tea (with or without caffeine).  Drinks that containalcohol.  Energy drinks and sports drinks.  Carbonated drinks or sodas.  Chocolate and cocoa.  Peppermint and mint flavorings.  Garlic and onions.  Horseradish.  Spicy and acidic foods, including peppers, chili powder, curry powder, vinegar, hot sauces, and barbecue sauce.  Citrus fruit juices and citrus fruits, such as oranges, lemons, and limes.  Tomato-based foods, such as red sauce,  chili, salsa, and pizza with red sauce.  Fried and fatty foods, such as donuts, french fries, potato chips, and high-fat dressings.  High-fat meats, such as hot dogs and fatty cuts of red and white meats, such as rib eye steak, sausage, ham, and  bacon.  High-fat dairy items, such as whole milk, butter, and cream cheese.  Eat small, frequent meals instead of large meals.  Avoid drinking large amounts of liquid with your meals.  Avoid eating meals during the 2-3 hours before bedtime.  Avoid lying down right after you eat.  Do not exercise right after you eat. General Instructions  Pay attention to any changes in your symptoms.  Take over-the-counter and prescription medicines only as told by your health care provider. Do not take aspirin, ibuprofen, or other NSAIDs unless your health care provider told you to do so.  Do not use any tobacco products, including cigarettes, chewing tobacco, and e-cigarettes. If you need help quitting, ask your health care provider.  Wear loose-fitting clothing. Do not wear anything tight around your waist that causes pressure on your abdomen.  Raise (elevate) the head of your bed 6 inches (15cm).  Try to reduce your stress, such as with yoga or meditation. If you need help reducing stress, ask your health care provider.  If you are overweight, reduce your weight to an amount that is healthy for you. Ask your health care provider for guidance about a safe weight loss goal.  Keep all follow-up visits as told by your health care provider. This is important. SEEK MEDICAL CARE IF:  You have new symptoms.  You have unexplained weight loss.  You have difficulty swallowing, or it hurts to swallow.  You have wheezing or a persistent cough.  Your symptoms do not improve with treatment.  You have a hoarse voice. SEEK IMMEDIATE MEDICAL CARE IF:  You have pain in your arms, neck, jaw, teeth, or back.  You feel sweaty, dizzy, or light-headed.  You have chest pain or shortness of breath.  You vomit and your vomit looks like blood or coffee grounds.  You faint.  Your stool is bloody or black.  You cannot swallow, drink, or eat.   This information is not intended to replace advice  given to you by your health care provider. Make sure you discuss any questions you have with your health care provider.   Document Released: 07/27/2005 Document Revised: 07/08/2015 Document Reviewed: 02/11/2015 Elsevier Interactive Patient Education 2016 [employer].    Food Choices for Gastroesophageal Reflux Disease, Adult When you have gastroesophageal reflux disease (GERD), the foods you eat and your eating habits are very important. Choosing the right foods can help ease the discomfort of GERD. WHAT GENERAL GUIDELINES DO I NEED TO FOLLOW?  Choose fruits, vegetables, whole grains, low-fat dairy products, and low-fat meat, fish, and poultry.  Limit fats such as oils, salad dressings, butter, nuts, and avocado.  Keep a food diary to identify foods that cause symptoms.  Avoid foods that cause reflux. These may be different for different people.  Eat frequent small meals instead of three large meals each day.  Eat your meals slowly, in a relaxed setting.  Limit fried foods.  Cook foods using methods other than frying.  Avoid drinking alcohol.  Avoid drinking large amounts of liquids with your meals.  Avoid bending over or lying down until 2-3 hours after eating. WHAT FOODS ARE NOT RECOMMENDED? The following are some foods and drinks that may worsen your symptoms: Vegetables  Tomatoes. Tomato juice. Tomato and spaghetti sauce. Chili peppers. Onion and garlic. Horseradish. Fruits Oranges, grapefruit, and lemon (fruit and juice). Meats High-fat meats, fish, and poultry. This includes hot dogs, ribs, ham, sausage, salami, and bacon. Dairy Whole milk and chocolate milk. Sour cream. Cream. Butter. Ice cream. Cream cheese.  Beverages Coffee and tea, with or without caffeine. Carbonated beverages or energy drinks. Condiments Hot sauce. Barbecue sauce.  Sweets/Desserts Chocolate and cocoa. Donuts. Peppermint and spearmint. Fats and Oils High-fat foods, including [place] fries  and potato chips. Other Vinegar. Strong spices, such as black pepper, white pepper, red pepper, cayenne, curry powder, cloves, ginger, and chili powder. The items listed above may not be a complete list of foods and beverages to avoid. Contact your dietitian for more information.   This information is not intended to replace advice given to you by your health care provider. Make sure you discuss any questions you have with your health care provider.   Document Released: 10/17/2005 Document Revised: 11/07/2014 Document Reviewed: 08/21/2013 Elsevier Interactive Patient Education [employer]2016 [employer] Inc.

## 2015-09-23 NOTE — Progress Notes (Signed)
REVIEWED. AGREE. NO ADDITIONAL RECOMMENDATIONS. 

## 2015-10-06 ENCOUNTER — Ambulatory Visit: Payer: Managed Care, Other (non HMO) | Admitting: Nurse Practitioner

## 2015-11-09 ENCOUNTER — Ambulatory Visit: Payer: Managed Care, Other (non HMO) | Admitting: Nurse Practitioner

## 2015-12-13 ENCOUNTER — Emergency Department (HOSPITAL_COMMUNITY)
Admission: EM | Admit: 2015-12-13 | Discharge: 2015-12-13 | Disposition: A | Discharge status: Home / Self Care | Payer: Managed Care, Other (non HMO) | Attending: Emergency Medicine | Admitting: Emergency Medicine

## 2015-12-13 ENCOUNTER — Encounter (HOSPITAL_COMMUNITY): Payer: Self-pay

## 2015-12-13 ENCOUNTER — Emergency Department (HOSPITAL_COMMUNITY): Payer: Managed Care, Other (non HMO)

## 2015-12-13 DIAGNOSIS — Z880 Allergy status to penicillin: Secondary | ICD-10-CM | POA: Insufficient documentation

## 2015-12-13 DIAGNOSIS — Z792 Long term (current) use of antibiotics: Secondary | ICD-10-CM | POA: Insufficient documentation

## 2015-12-13 DIAGNOSIS — J069 Acute upper respiratory infection, unspecified: Secondary | ICD-10-CM | POA: Insufficient documentation

## 2015-12-13 DIAGNOSIS — K219 Gastro-esophageal reflux disease without esophagitis: Secondary | ICD-10-CM | POA: Insufficient documentation

## 2015-12-13 DIAGNOSIS — Z79899 Other long term (current) drug therapy: Secondary | ICD-10-CM | POA: Insufficient documentation

## 2015-12-13 DIAGNOSIS — I10 Essential (primary) hypertension: Secondary | ICD-10-CM | POA: Insufficient documentation

## 2015-12-13 HISTORY — DX: Personal history of other diseases of the digestive system: Z87.19

## 2015-12-13 HISTORY — DX: Other specified postprocedural states: Z98.890

## 2015-12-13 MED ORDER — PREDNISONE 20 MG PO TABS
40.0000 mg | ORAL_TABLET | Freq: Every day | ORAL | Status: DC
Start: 2015-12-13 — End: 2016-03-23

## 2015-12-13 MED ORDER — PREDNISONE 20 MG PO TABS
40.0000 mg | ORAL_TABLET | Freq: Once | ORAL | Status: AC
  Administered 2015-12-13: 40 mg via ORAL
  Filled 2015-12-13: qty 2

## 2015-12-13 MED ORDER — ALBUTEROL SULFATE HFA 108 (90 BASE) MCG/ACT IN AERS: 1.0000 | INHALATION_SPRAY | Freq: Four times a day (QID) | RESPIRATORY_TRACT | Status: DC | PRN

## 2015-12-13 MED ORDER — IPRATROPIUM-ALBUTEROL 0.5-2.5 (3) MG/3ML IN SOLN
3.0000 mL | Freq: Once | RESPIRATORY_TRACT | Status: AC
  Administered 2015-12-13: 3 mL via RESPIRATORY_TRACT
  Filled 2015-12-13: qty 3

## 2015-12-13 NOTE — ED Notes (Signed)
Past Monday pt given antibiotic and cough medicine for headache and cough.  Pt states symptoms continue.  No fever.  Body aches.

## 2015-12-13 NOTE — Discharge Instructions (Signed)
1. Medications: Inhaler as needed for shortness of breath, prednisone, mucinex, tylenol or ibuprofen as needed for headache. 2. Treatment: rest, drink plenty of fluids 3. Follow Up: Please follow up with your primary doctor in 5 days for discussion of your diagnoses and further evaluation after today's visit; Please return to the ER for new or worsening symptoms, any additional concerns.

## 2015-12-13 NOTE — ED Notes (Signed)
Patient was alert, oriented and stable upon discharge. RN went over AVS and patient had no further questions.  

## 2015-12-13 NOTE — ED Provider Notes (Signed)
CSN: [id number]     Arrival date & time 12/13/15  1426 History   First MD Initiated Contact with Patient 12/13/15 1810     Chief Complaint  Patient presents with  . Cough  . Headache    (Consider location/radiation/quality/duration/timing/severity/associated sxs/prior Treatment) Patient is a 45 y.o. female presenting with cough and headaches. The history is provided by the patient and medical records. No language interpreter was used.  Cough Associated symptoms: headaches and wheezing   Associated symptoms: no chills, no fever, no rash, no shortness of breath and no sore throat   Headache Associated symptoms: congestion and cough   Associated symptoms: no abdominal pain, no back pain, no dizziness, no fever, no nausea, no neck pain, no sore throat, no vomiting and no weakness     Karla Ward is a 45 y.o. female  with a PMH of HTN, GERD who presents to the Emergency Department complaining of worsening cough and generalized headache since Monday (6 days ago). Patient was seen by her PCP Dr. Sudie Bailey in [place] for these symptoms - started on cipro and given codeine cough syrup. Patient states worsening of symptoms despite compliance with medication. Admits to wheezing. Denies fever, chest pain.  No history of asthma, never a smoker.   Past Medical History  Diagnosis Date  . Hypertension   . GERD (gastroesophageal reflux disease)   . Vertigo   . Gastritis   . Status post dilation of esophageal narrowing    Past Surgical History  Procedure Laterality Date  . None to date  08/13/15  . Esophagogastroduodenoscopy N/A 09/02/2015    WUJ:WJXB non-erosive gastritis/stricture at the gastroesophageal junction   . Savory dilation  09/02/2015    Procedure: SAVORY DILATION;  Surgeon: West Bali, MD;  Location: [facility];  Service: Endoscopy;;   Family History  Problem Relation Age of Onset  . Colon cancer Neg Hx   . Throat cancer Father 44   Social History  Substance Use  Topics  . Smoking status: Never Smoker   . Smokeless tobacco: Never Used  . Alcohol Use: No   OB History    No data available     Review of Systems  Constitutional: Negative for fever and chills.  HENT: Positive for congestion. Negative for sore throat.   Eyes: Negative for visual disturbance.  Respiratory: Positive for cough and wheezing. Negative for shortness of breath.   Cardiovascular: Negative.   Gastrointestinal: Negative for nausea, vomiting and abdominal pain.  Genitourinary: Negative for dysuria.  Musculoskeletal: Negative for back pain and neck pain.  Skin: Negative for rash.  Neurological: Positive for headaches. Negative for dizziness and weakness.      Allergies  Benadryl and Penicillins  Home Medications   Prior to Admission medications   Medication Sig Start Date End Date Taking? Authorizing Provider  amLODipine (NORVASC) 5 MG tablet Take 5 mg by mouth daily.   Yes Historical Provider, MD  ciprofloxacin (CIPRO) 500 MG tablet Take 500 mg by mouth 2 (two) times daily.   Yes Historical Provider, MD  guaiFENesin-codeine (ROBITUSSIN AC) 100-10 MG/5ML syrup Take 5 mLs by mouth 3 (three) times daily as needed for cough.   Yes Historical Provider, MD  meclizine (ANTIVERT) 25 MG tablet Take 25 mg by mouth 3 (three) times daily as needed for dizziness.   Yes Historical Provider, MD  medroxyPROGESTERone (DEPO-PROVERA) 150 MG/ML injection Inject 150 mg into the muscle every 3 (three) months.   Yes Historical Provider, MD  omeprazole (PRILOSEC)  20 MG capsule Take 20 mg by mouth daily.    Yes Historical Provider, MD  ondansetron (ZOFRAN) 4 MG tablet Take 4 mg by mouth every 8 (eight) hours as needed for nausea or vomiting.   Yes Historical Provider, MD  pseudoephedrine-acetaminophen (TYLENOL SINUS) 30-500 MG TABS tablet Take 2 tablets by mouth every 4 (four) hours as needed (sinuses/cold symptoms).   Yes Historical Provider, MD  sucralfate (CARAFATE) 1 G tablet Take 1 tablet  (1 g total) by mouth 4 (four) times daily -  with meals and at bedtime. Patient taking differently: Take 1 g by mouth 4 (four) times daily as needed (stomach ulcer/pain).  09/17/15  Yes Gerhard Munch, MD  albuterol (PROVENTIL HFA;VENTOLIN HFA) 108 (90 Base) MCG/ACT inhaler Inhale 1-2 puffs into the lungs every 6 (six) hours as needed for wheezing or shortness of breath. 12/13/15   Chase Picket Julane Crock, PA-C  ondansetron (ZOFRAN ODT) 4 MG disintegrating tablet Take 1 tablet (4 mg total) by mouth every 8 (eight) hours as needed for nausea or vomiting. Patient not taking: Reported on 12/13/2015 08/09/15   Mercedes Camprubi-Soms, PA-C  predniSONE (DELTASONE) 20 MG tablet Take 2 tablets (40 mg total) by mouth daily. 12/13/15   Edilberto Roosevelt Pilcher Mickey Hebel, PA-C   BP 135/96 mmHg  Pulse 103  Temp(Src) 98.3 F (36.8 C) (Oral)  Resp 14  SpO2 100% Physical Exam  Constitutional: She is oriented to person, place, and time. She appears well-developed and well-nourished.  Alert and in no acute distress  HENT:  Head: Normocephalic and atraumatic.  OP with erythema, no exudates, no edema.   Neck: Normal range of motion.  Cardiovascular: Normal rate, regular rhythm and normal heart sounds.  Exam reveals no gallop and no friction rub.   No murmur heard. Pulmonary/Chest: Effort normal. No respiratory distress.  Expiratory wheezes in bilateral lung fields.   Abdominal: Soft. Bowel sounds are normal. She exhibits no distension and no mass. There is no tenderness. There is no rebound and no guarding.  Musculoskeletal: She exhibits no edema.  Neurological: She is alert and oriented to person, place, and time.  Skin: Skin is warm and dry. No rash noted.  Psychiatric: She has a normal mood and affect. Her behavior is normal. Judgment and thought content normal.  Nursing note and vitals reviewed.   ED Course  Procedures (including critical care time) Labs Review Labs Reviewed - No data to display  Imaging Review Dg  Chest 2 View  12/13/2015  CLINICAL DATA:  Productive cough. Shortness of breath and wheezing for the past 4 days. History of hypertension. EXAM: CHEST  2 VIEW COMPARISON:  09/17/2015; 07/17/2015; 10/08/2014 ; 08/03/2009; chest CT - 11/19/2012 FINDINGS: Grossly unchanged cardiac silhouette and mediastinal contours. There is persistent mild elevation/ eventration involving the medial aspect the right hemidiaphragm. No focal airspace opacities. No pleural effusion or pneumothorax. No evidence of edema. No acute osseous abnormalities. IMPRESSION: No acute cardiopulmonary disease. Electronically Signed   By: Simonne Come M.D.   On: 12/13/2015 14:59   I have personally reviewed and evaluated these images and lab results as part of my medical decision-making.   EKG Interpretation None      MDM   Final diagnoses:  URI (upper respiratory infection)   Eunice Blase presents with persistent cough x 6 days. Was seen by PCP on Monday when symptoms began and started on Cipro and cough syrup. Cough worsening despite compliance with medication. No hx of COPD, asthma. Never a smoker. On exam,  patient with expiratory wheezes in all lung fields.   Imaging: CXR with no acute cardiopulmonary disease.  Therapeutics: Duoneb, prednisone  8:02 PM - Patient reevaluated and feels much improved, lung sounds improved. 100% on RA.   Will discharge to home in good and stable condition with inhaler and 3 day steroid burst. Return precautions and home care instructions given. PCP follow up. All questions answered.   [facility] Chisum Habenicht, PA-C 12/13/15 2010  Lorre Nick, MD 12/16/15 [phone]

## 2016-02-16 ENCOUNTER — Encounter: Payer: Self-pay | Admitting: Nurse Practitioner

## 2016-02-25 ENCOUNTER — Ambulatory Visit (INDEPENDENT_AMBULATORY_CARE_PROVIDER_SITE_OTHER): Payer: BLUE CROSS/BLUE SHIELD | Admitting: Otolaryngology

## 2016-02-25 DIAGNOSIS — H6983 Other specified disorders of Eustachian tube, bilateral: Secondary | ICD-10-CM

## 2016-03-08 ENCOUNTER — Ambulatory Visit: Payer: Managed Care, Other (non HMO) | Admitting: Nurse Practitioner

## 2016-03-09 ENCOUNTER — Ambulatory Visit: Payer: Managed Care, Other (non HMO) | Admitting: Nurse Practitioner

## 2016-03-11 ENCOUNTER — Telehealth: Payer: Self-pay | Admitting: Gastroenterology

## 2016-03-11 NOTE — Telephone Encounter (Signed)
Pt said that she had an EGD last year and her discharge instructions said not to take any ibuprofen, but she is having ear pain and wanted to know could she take ibuprofen just for a few days. Please call [phone]

## 2016-03-14 NOTE — Telephone Encounter (Signed)
Spoke with the pt, she said she talked to Dr.Fields on Friday and was told it was ok to take ibuprofen for just a few days and to make sure she took it with milk or food.

## 2016-03-23 ENCOUNTER — Encounter: Payer: Self-pay | Admitting: Nurse Practitioner

## 2016-03-23 ENCOUNTER — Ambulatory Visit (INDEPENDENT_AMBULATORY_CARE_PROVIDER_SITE_OTHER): Payer: BLUE CROSS/BLUE SHIELD | Admitting: Nurse Practitioner

## 2016-03-23 VITALS — BP 144/79 | HR 84 | Temp 98.1°F | Ht 64.0 in | Wt 172.4 lb

## 2016-03-23 DIAGNOSIS — K219 Gastro-esophageal reflux disease without esophagitis: Secondary | ICD-10-CM

## 2016-03-23 NOTE — Progress Notes (Signed)
Referring Provider: Gareth Morgan, MD Primary Care Physician:  [name], MD Primary GI:  Dr. Darrick Penna  Chief Complaint  Patient presents with  . Follow-up  . Gastroesophageal Reflux    HPI:   Karla Ward is a 45 y.o. female who presents for follow-up on GERD and esophagitis. Last visit on 09/22/2015 at which point her GERD symptoms were much improved on Prilosec twice a day. Recommended continue twice a day dosing for full 2 weeks and return to once a day dosing. Food trigger education provided. Recommended follow-up in 6 months.  Today she states she's doing well overall. Has been having ear irritation, saw ENT who put her on steroid course for what sound like eustachian tube dysfunction. 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 GERD symptoms are doing well. Is learning foods to avoid. Is now avoiding carbonated beverages. Is taking Prilosec daily currently with minimal breakthrough symptoms related to diet. Did also have a 2 day episode of abdominal pain which self-resolved which she feels was also diet-related. Denies other abdominal pain, N/V, fever, chills, unintentional weight loss. Denies chest pain, dyspnea, dizziness, lightheadedness, syncope, near syncope. Denies any other upper or lower GI symptoms.  Past Medical History  Diagnosis Date  . Hypertension   . GERD (gastroesophageal reflux disease)   . Vertigo   . Gastritis   . Status post dilation of esophageal narrowing     Past Surgical History  Procedure Laterality Date  . None to date  08/13/15  . Esophagogastroduodenoscopy N/A 09/02/2015    AVW:[id number] non-erosive gastritis/stricture at the gastroesophageal junction   . Savory dilation  09/02/2015    Procedure: SAVORY DILATION;  Surgeon: West Bali, MD;  Location: [facility];  Service: Endoscopy;;    Current Outpatient Prescriptions  Medication Sig Dispense Refill  . albuterol (PROVENTIL HFA;VENTOLIN HFA) 108 (90 Base) MCG/ACT inhaler Inhale 1-2 puffs into the  lungs every 6 (six) hours as needed for wheezing or shortness of breath. 1 Inhaler 0  . amLODipine (NORVASC) 5 MG tablet Take 5 mg by mouth daily.    Marland Kitchen guaiFENesin-codeine (ROBITUSSIN AC) 100-10 MG/5ML syrup Take 5 mLs by mouth 3 (three) times daily as needed for cough.    . meclizine (ANTIVERT) 25 MG tablet Take 25 mg by mouth 3 (three) times daily as needed for dizziness.    . medroxyPROGESTERone (DEPO-PROVERA) 150 MG/ML injection Inject 150 mg into the muscle every 3 (three) months.    Marland Kitchen omeprazole (PRILOSEC) 20 MG capsule Take 20 mg by mouth daily.     . ondansetron (ZOFRAN ODT) 4 MG disintegrating tablet Take 1 tablet (4 mg total) by mouth every 8 (eight) hours as needed for nausea or vomiting. 15 tablet 0  . ondansetron (ZOFRAN) 4 MG tablet Take 4 mg by mouth every 8 (eight) hours as needed for nausea or vomiting.    . sucralfate (CARAFATE) 1 G tablet Take 1 tablet (1 g total) by mouth 4 (four) times daily -  with meals and at bedtime. (Patient taking differently: Take 1 g by mouth 4 (four) times daily as needed (stomach ulcer/pain). ) 21 tablet 0  . [DISCONTINUED] propranolol (INDERAL) 10 MG tablet Take 1 tablet (10 mg total) by mouth as needed. (Patient not taking: Reported on 07/11/2015) 30 tablet 3   No current facility-administered medications for this visit.    Allergies as of 03/23/2016 - Review Complete 03/23/2016  Allergen Reaction Noted  . Benadryl [diphenhydramine hcl (sleep)]  12/26/2014  . Penicillins Hives 11/19/2012  Family History  Problem Relation Age of Onset  . Colon cancer Neg Hx   . Throat cancer Father 7165    Social History   Social History  . Marital Status: Married    Spouse Name: N/A  . Number of Children: N/A  . Years of Education: N/A   Social History Main Topics  . Smoking status: Never Smoker   . Smokeless tobacco: Never Used  . Alcohol Use: No  . Drug Use: No  . Sexual Activity: Yes    Birth Control/ Protection: Injection   Other Topics  Concern  . None   Social History Narrative    Review of Systems: 10-point ROS negative except as per HPI.   Physical Exam: BP 144/79 mmHg  Pulse 84  Temp(Src) 98.1 F (36.7 C)  Ht 5\' 4"  (1.626 m)  Wt 172 lb 6.4 oz (78.2 kg)  BMI 29.58 kg/m2 General:   Alert and oriented. Pleasant and cooperative. Well-nourished and well-developed.  Ears:  Normal auditory acuity. Cardiovascular:  S1, S2 present without murmurs appreciated. Extremities without clubbing or edema. Respiratory:  Clear to auscultation bilaterally. No wheezes, rales, or rhonchi. No distress.  Gastrointestinal:  +BS, soft, non-tender and non-distended. No HSM noted. No guarding or rebound. No masses appreciated.  Rectal:  Deferred  Neurologic:  Alert and oriented x4;  grossly normal neurologically. Psych:  Alert and cooperative. Normal mood and affect.    03/23/2016 9:58 AM   Disclaimer: This note was dictated with voice recognition software. Similar sounding words can inadvertently be transcribed and may not be corrected upon review.

## 2016-03-23 NOTE — Patient Instructions (Signed)
1. Continue taking Prilosec once a day. 2. Return for follow-up in one year. 3. Call notify us if you have any change in symptoms or worsening symptoms.

## 2016-03-23 NOTE — Assessment & Plan Note (Signed)
GERD symptoms continue to do well on once a day Prilosec. Has occasional/rare breakthrough symptoms related to dietary intake. She is currently still learning which foods trigger her symptoms and is actively avoiding those. Is also avoiding all NSAIDs and aspirin powders. Recommend continue once daily Prilosec, return for follow-up in one year which point she would like to discuss tapering off and possibly discontinuing Prilosec as a trial.

## 2016-04-22 ENCOUNTER — Ambulatory Visit (INDEPENDENT_AMBULATORY_CARE_PROVIDER_SITE_OTHER): Payer: BLUE CROSS/BLUE SHIELD | Admitting: Obstetrics and Gynecology

## 2016-04-22 ENCOUNTER — Encounter: Payer: Self-pay | Admitting: Obstetrics and Gynecology

## 2016-04-22 VITALS — BP 130/90 | Ht 64.0 in | Wt 174.0 lb

## 2016-04-22 DIAGNOSIS — Z3042 Encounter for surveillance of injectable contraceptive: Secondary | ICD-10-CM

## 2016-04-22 DIAGNOSIS — N939 Abnormal uterine and vaginal bleeding, unspecified: Secondary | ICD-10-CM | POA: Diagnosis not present

## 2016-04-22 LAB — POCT HEMOGLOBIN: HEMOGLOBIN: 11.8 g/dL — AB (ref 12.2–16.2)

## 2016-04-22 MED ORDER — MEGESTROL ACETATE 40 MG PO TABS: 40.0000 mg | ORAL_TABLET | Freq: Three times a day (TID) | ORAL | Status: DC

## 2016-04-22 NOTE — Progress Notes (Signed)
[facility] Visit  04/22/2016          Patient name: Karla Ward MRN [id number]  Date of birth: 01/10/1971  CC & HPI:  Karla Ward is a 45 y.o. female presenting today for irregular bleeding. Pt has not been Rx any medications to control her irregular bleeding. Pt last pap smear was nl. Pt reports that she has been on depo provera injection x 18 years. Pt denies her bone density being monitored due to her prolonged use of depo provera. Pt notes that her next depo provera injection is next month. Denies any other symptoms. Pt states that last year her iron levels were low and she was placed on vitamin D.   ROS:  ROS  +irregular bleeding   Pertinent History Reviewed:   Reviewed: Significant for  Medical         Past Medical History  Diagnosis Date  . Hypertension   . GERD (gastroesophageal reflux disease)   . Vertigo   . Gastritis   . Status post dilation of esophageal narrowing                               Surgical Hx:    Past Surgical History  Procedure Laterality Date  . None to date  08/13/15  . Esophagogastroduodenoscopy N/A 09/02/2015    WUJ:[id number]:mild non-erosive gastritis/stricture at the gastroesophageal junction   . Savory dilation  09/02/2015    Procedure: SAVORY DILATION;  Surgeon: West BaliSandi L Fields, MD;  Location: [facility];  Service: Endoscopy;;   Medications: Reviewed & Updated - see associated section                       Current outpatient prescriptions:  .  amLODipine (NORVASC) 5 MG tablet, Take 5 mg by mouth daily., Disp: , Rfl:  .  meclizine (ANTIVERT) 25 MG tablet, Take 25 mg by mouth 3 (three) times daily as needed for dizziness., Disp: , Rfl:  .  medroxyPROGESTERone (DEPO-PROVERA) 150 MG/ML injection, Inject 150 mg into the muscle every 3 (three) months., Disp: , Rfl:  .  omeprazole (PRILOSEC) 20 MG capsule, Take 20 mg by mouth daily. , Disp: , Rfl:  .  ondansetron (ZOFRAN ODT) 4 MG disintegrating tablet, Take 1 tablet (4 mg total) by mouth  every 8 (eight) hours as needed for nausea or vomiting., Disp: 15 tablet, Rfl: 0 .  [DISCONTINUED] propranolol (INDERAL) 10 MG tablet, Take 1 tablet (10 mg total) by mouth as needed. (Patient not taking: Reported on 07/11/2015), Disp: 30 tablet, Rfl: 3   Social History: Reviewed -  reports that she has never smoked. She has never used smokeless tobacco.  Objective Findings:  Vitals: Blood pressure 130/90, height 5\' 4"  (1.626 m), weight 174 lb (78.926 kg).  Discussed with pt risks and benefits of endometrial ablation versus IUD. At end of discussion, pt had opportunity to ask questions and has no further questions at this time.   Greater than 50% was spent in counseling and coordination of care with the patient. Total time greater than: 20 minutes   Assessment & Plan:   A:  1. AUB on long term depo Provera use.  P:  1. Megace Rx 40 mg TID 2. Order bone density scan 3. Order hemoglobin test 4. Follow up in 1 month   By signing my name below, I, Soijett Blue, attest that this documentation has  been prepared under the direction and in the presence of Tilda BurrowJohn V Timmie Calix, MD. Electronically Signed: Soijett Blue, ED Scribe. 04/22/2016. 11:37 AM.  I personally performed the services described in this documentation, which was SCRIBED in my presence. The recorded information has been reviewed and considered accurate. It has been edited as necessary during review. Tilda BurrowFERGUSON,Sharley Keeler V, MD

## 2016-04-25 ENCOUNTER — Encounter: Payer: Self-pay | Admitting: Obstetrics and Gynecology

## 2016-04-28 ENCOUNTER — Other Ambulatory Visit (HOSPITAL_COMMUNITY): Payer: BLUE CROSS/BLUE SHIELD

## 2016-05-10 ENCOUNTER — Other Ambulatory Visit (HOSPITAL_COMMUNITY): Payer: Self-pay | Admitting: Family Medicine

## 2016-05-10 DIAGNOSIS — Z1231 Encounter for screening mammogram for malignant neoplasm of breast: Secondary | ICD-10-CM

## 2016-05-12 ENCOUNTER — Ambulatory Visit (HOSPITAL_COMMUNITY)
Admission: RE | Admit: 2016-05-12 | Discharge: 2016-05-12 | Disposition: A | Discharge status: Home / Self Care | Payer: BLUE CROSS/BLUE SHIELD | Source: Ambulatory Visit | Attending: Obstetrics and Gynecology | Admitting: Obstetrics and Gynecology

## 2016-05-12 DIAGNOSIS — Z3042 Encounter for surveillance of injectable contraceptive: Secondary | ICD-10-CM

## 2016-05-20 ENCOUNTER — Ambulatory Visit: Payer: BLUE CROSS/BLUE SHIELD | Admitting: Obstetrics and Gynecology

## 2016-05-27 ENCOUNTER — Encounter: Payer: Self-pay | Admitting: Obstetrics and Gynecology

## 2016-05-27 ENCOUNTER — Ambulatory Visit (INDEPENDENT_AMBULATORY_CARE_PROVIDER_SITE_OTHER): Payer: BLUE CROSS/BLUE SHIELD | Admitting: Obstetrics and Gynecology

## 2016-05-27 DIAGNOSIS — N921 Excessive and frequent menstruation with irregular cycle: Secondary | ICD-10-CM

## 2016-05-27 DIAGNOSIS — Z308 Encounter for other contraceptive management: Secondary | ICD-10-CM

## 2016-05-27 DIAGNOSIS — N938 Other specified abnormal uterine and vaginal bleeding: Secondary | ICD-10-CM | POA: Diagnosis not present

## 2016-05-27 NOTE — Progress Notes (Signed)
[facility] Visit  05/27/16            Patient name: Karla Ward MRN [id number]  Date of birth: 22-Jul-1971  CC & HPI:  Karla Ward is a 45 y.o. female presenting today for the results of her Dexa scan done at [facility] 2 weeks ago.DEXA was performed because she's been on Depo-Provera for many many years . Pt would also like to know if she can continue with Depo and Megace which she was placed on during last visit which has improved her menorrhagia.  ROS:  ROS Otherwise negative   Pertinent History Reviewed:   Reviewed: Significant for   Medical         Past Medical History:  Diagnosis Date  . Gastritis   . GERD (gastroesophageal reflux disease)   . Hypertension   . Status post dilation of esophageal narrowing   . Vertigo                               Surgical Hx:    Past Surgical History:  Procedure Laterality Date  . ESOPHAGOGASTRODUODENOSCOPY N/A 09/02/2015   NUU:VOZD non-erosive gastritis/stricture at the gastroesophageal junction   . NONE TO DATE  08/13/15  . SAVORY DILATION  09/02/2015   Procedure: SAVORY DILATION;  Surgeon: West Bali, MD;  [facility]: [facility];  Service: Endoscopy;;   Medications: Reviewed & Updated - see associated section                       Current Outpatient Prescriptions:  .  amLODipine (NORVASC) 5 MG tablet, Take 5 mg by mouth daily., Disp: , Rfl:  .  meclizine (ANTIVERT) 25 MG tablet, Take 25 mg by mouth 3 (three) times daily as needed for dizziness., Disp: , Rfl:  .  medroxyPROGESTERone (DEPO-PROVERA) 150 MG/ML injection, Inject 150 mg into the muscle every 3 (three) months., Disp: , Rfl:  .  megestrol (MEGACE) 40 MG tablet, Take 1 tablet (40 mg total) by mouth 3 (three) times daily. While having breakthru bleeding.once daily x 7 d after bleeding stops, then d/c, Disp: 45 tablet, Rfl: 2 .  omeprazole (PRILOSEC) 20 MG capsule, Take 20 mg by mouth daily. , Disp: , Rfl:  .  ondansetron (ZOFRAN ODT) 4 MG disintegrating  tablet, Take 1 tablet (4 mg total) by mouth every 8 (eight) hours as needed for nausea or vomiting., Disp: 15 tablet, Rfl: 0   Social History: Reviewed -  reports that she has never smoked. She has never used smokeless tobacco.  Objective Findings:  Vitals: Blood pressure 120/80, height  (1.626 m), weight 179 lb (81.2 kg).  Physical Examination: General appearance - alert, well appearing, and in no distress and oriented to person, place, and time Mental status - alert, oriented to person, place, and time, normal mood, behavior, speech, dress, motor activity, and thought processes Pelvic exam: examination not indicated.   Dexa Scan Results 05/12/16 Expected range for Age   Assessment & Plan:   A:  1. Normal bone density 2. Chronic Provera use 3. AUB, controlled.   P:  1. F/u in 3 months for Depo shot    By signing my name below, I, Karla Ward, attest that this documentation has been prepared under the direction and in the presence of Karla Burrow, MD . Electronically Signed: Freida Ward, Scribe. 05/27/2016. 9:48 AM.  I personally  performed the services described in this documentation, which was SCRIBED in my presence. The recorded information has been reviewed and considered accurate. It has been edited as necessary during review. Karla Burrow, MD

## 2016-05-30 DIAGNOSIS — N920 Excessive and frequent menstruation with regular cycle: Secondary | ICD-10-CM | POA: Insufficient documentation

## 2016-06-02 ENCOUNTER — Ambulatory Visit (HOSPITAL_COMMUNITY)
Admission: RE | Admit: 2016-06-02 | Discharge: 2016-06-02 | Disposition: A | Discharge status: Home / Self Care | Payer: BLUE CROSS/BLUE SHIELD | Source: Ambulatory Visit | Attending: Family Medicine | Admitting: Family Medicine

## 2016-06-02 DIAGNOSIS — Z1231 Encounter for screening mammogram for malignant neoplasm of breast: Secondary | ICD-10-CM | POA: Insufficient documentation

## 2016-07-28 ENCOUNTER — Ambulatory Visit (INDEPENDENT_AMBULATORY_CARE_PROVIDER_SITE_OTHER): Payer: BLUE CROSS/BLUE SHIELD | Admitting: Otolaryngology

## 2016-08-07 IMAGING — DX DG CHEST 2V
2 series · 2 of 2 positions shown · non-contrast
Comparison: July 17, 2015.

CLINICAL DATA: Chest pain for 2 days.

EXAM:
CHEST  2 VIEW

[chest pa]
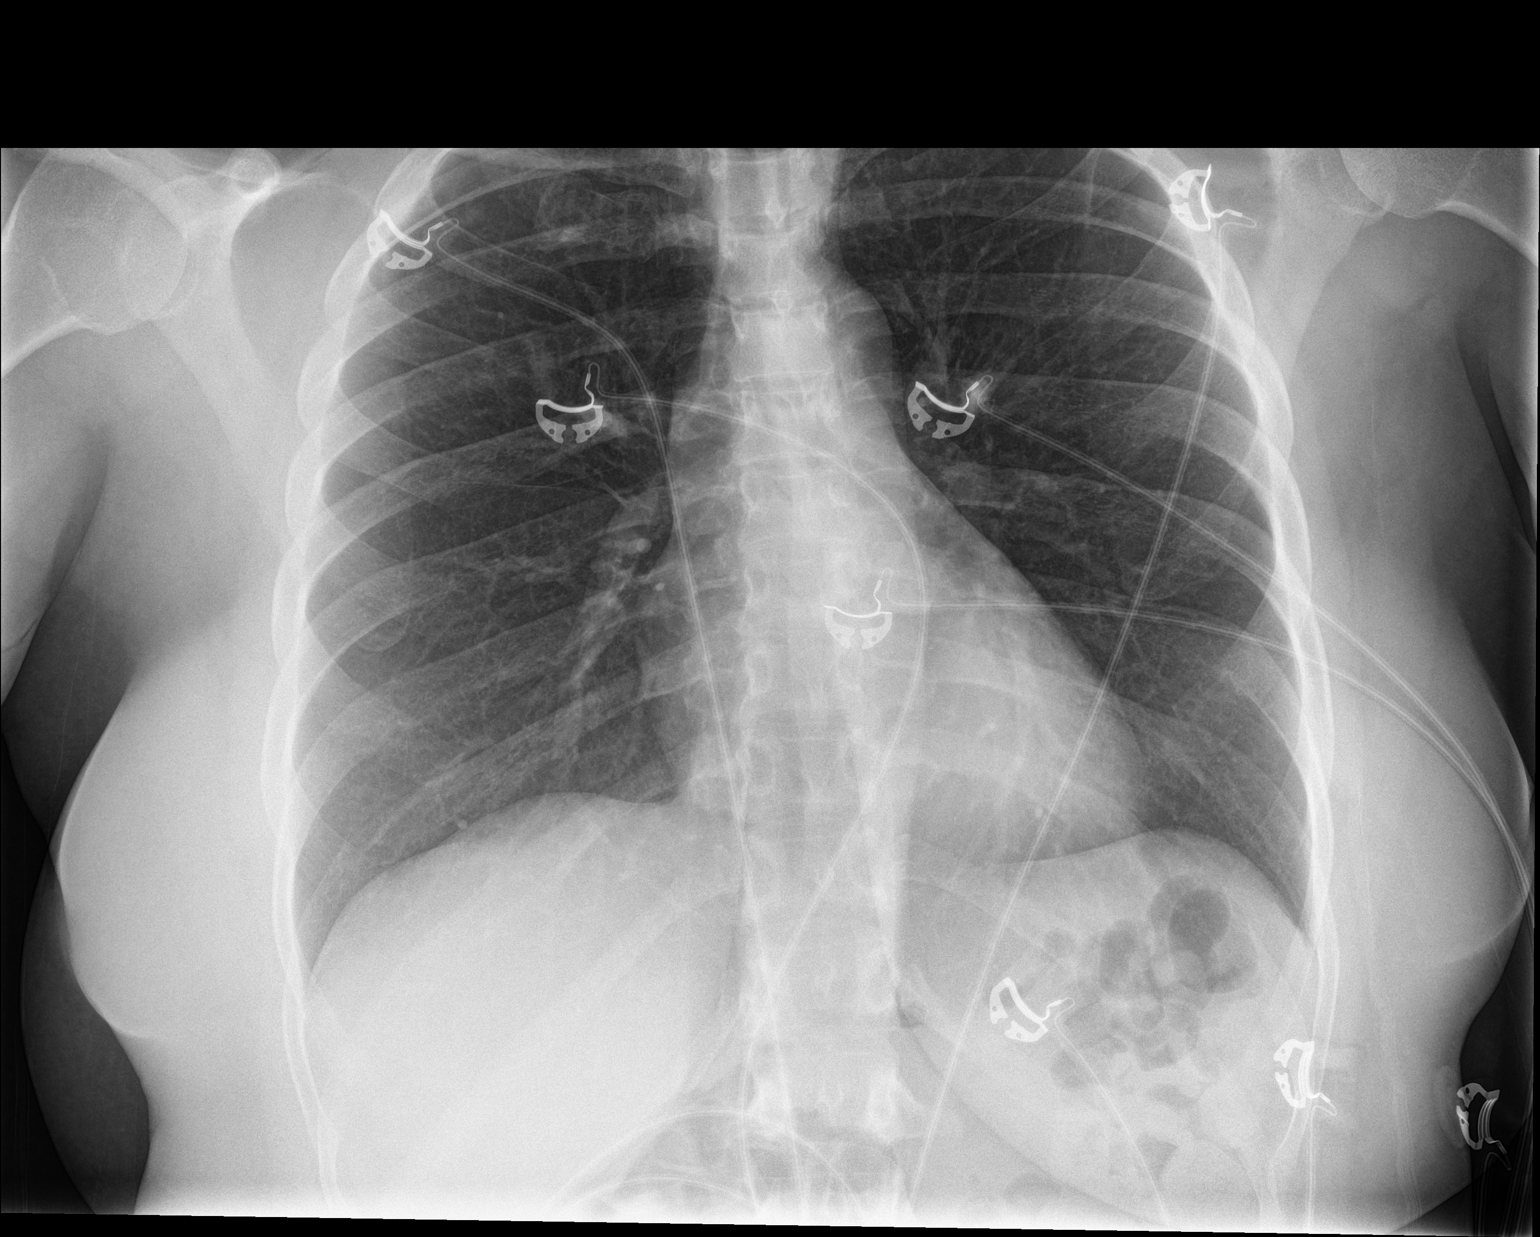

[chest lat]
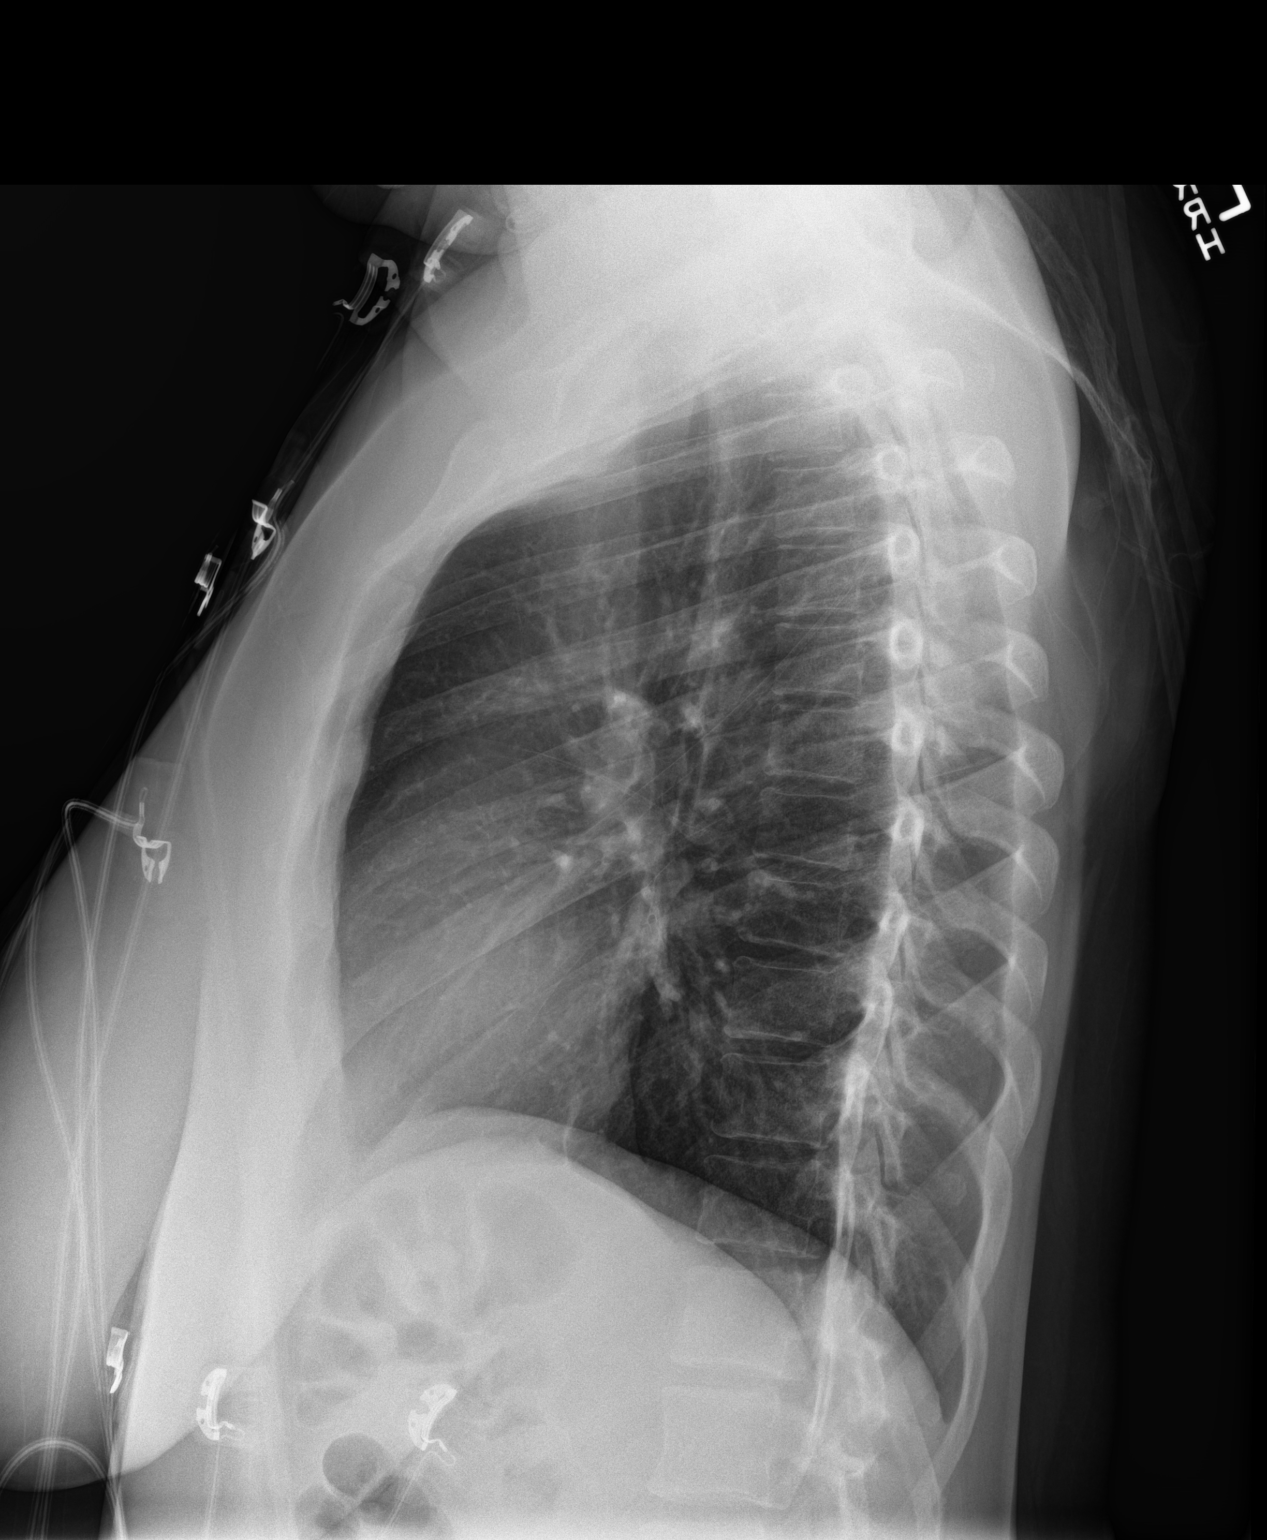

[2 of 2 positions shown; findings below may reference images not displayed]

FINDINGS: The heart size and mediastinal contours are within normal limits.
Both lungs are clear. No pneumothorax or pleural effusion is noted.
The visualized skeletal structures are unremarkable.
IMPRESSION: No active cardiopulmonary disease.

## 2016-08-25 ENCOUNTER — Ambulatory Visit (INDEPENDENT_AMBULATORY_CARE_PROVIDER_SITE_OTHER): Payer: BLUE CROSS/BLUE SHIELD | Admitting: Otolaryngology

## 2016-08-29 ENCOUNTER — Ambulatory Visit: Payer: BLUE CROSS/BLUE SHIELD

## 2016-09-04 IMAGING — MG MM DIGITAL SCREENING BILAT W/ CAD
6 series · 6 of 6 positions shown · non-contrast
Comparison: Previous exam(s).

CLINICAL DATA: Screening.

EXAM:
DIGITAL SCREENING BILATERAL MAMMOGRAM WITH CAD

[L MLO (1 of 2)]
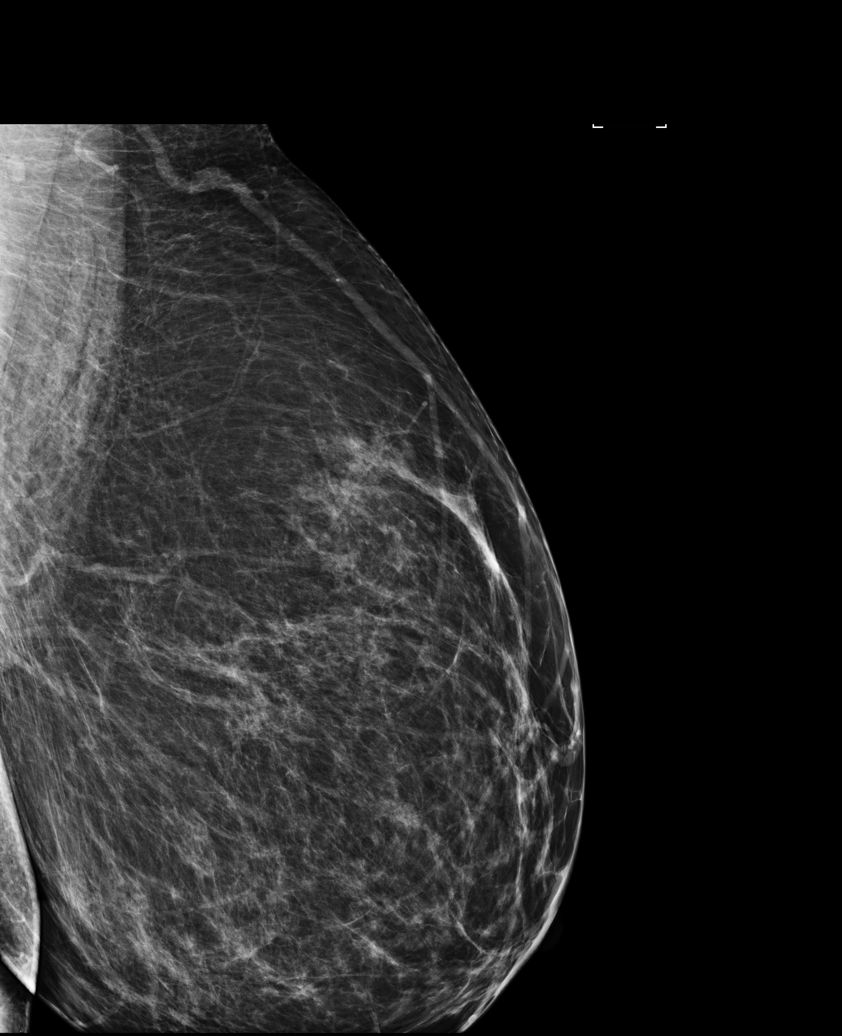

[L MLO (2 of 2)]
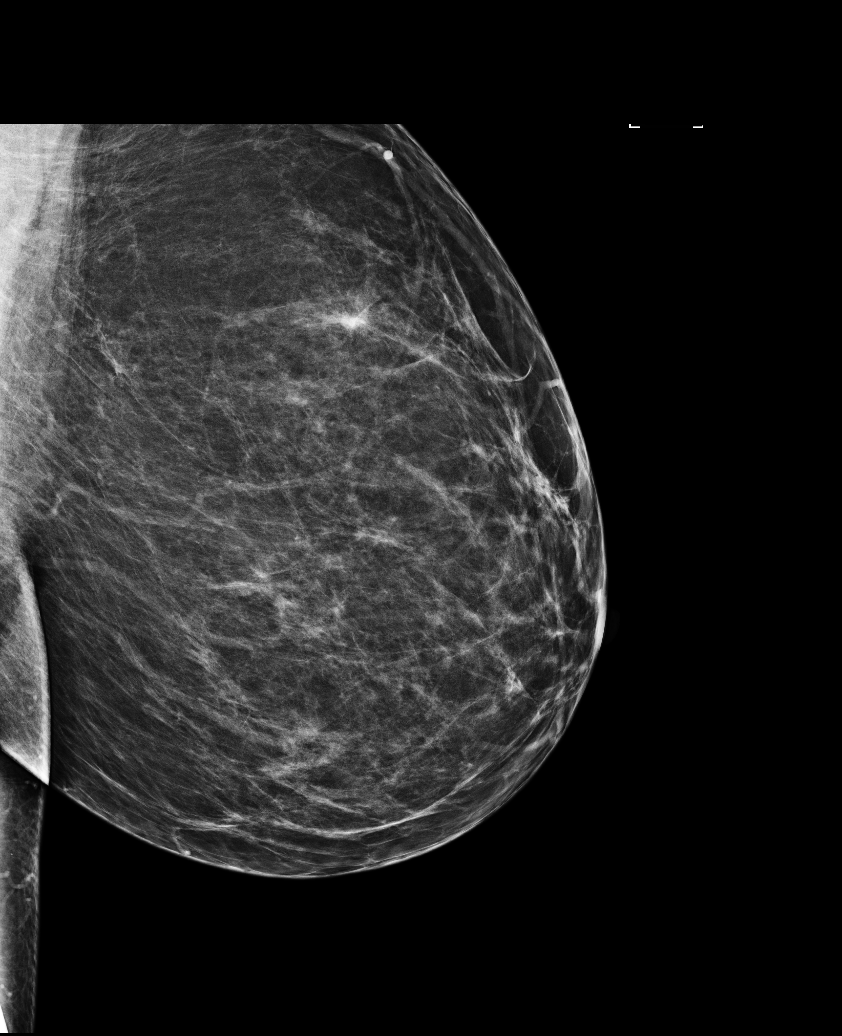

[L CC]
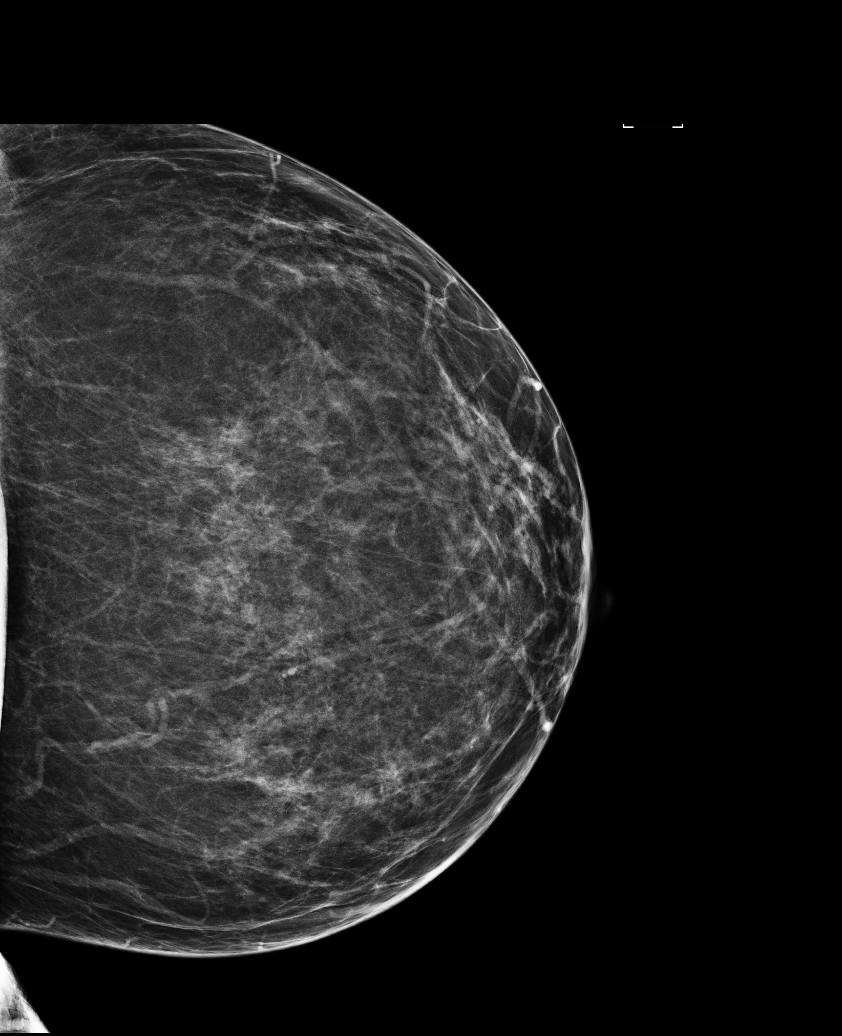

[R MLO (1 of 2)]
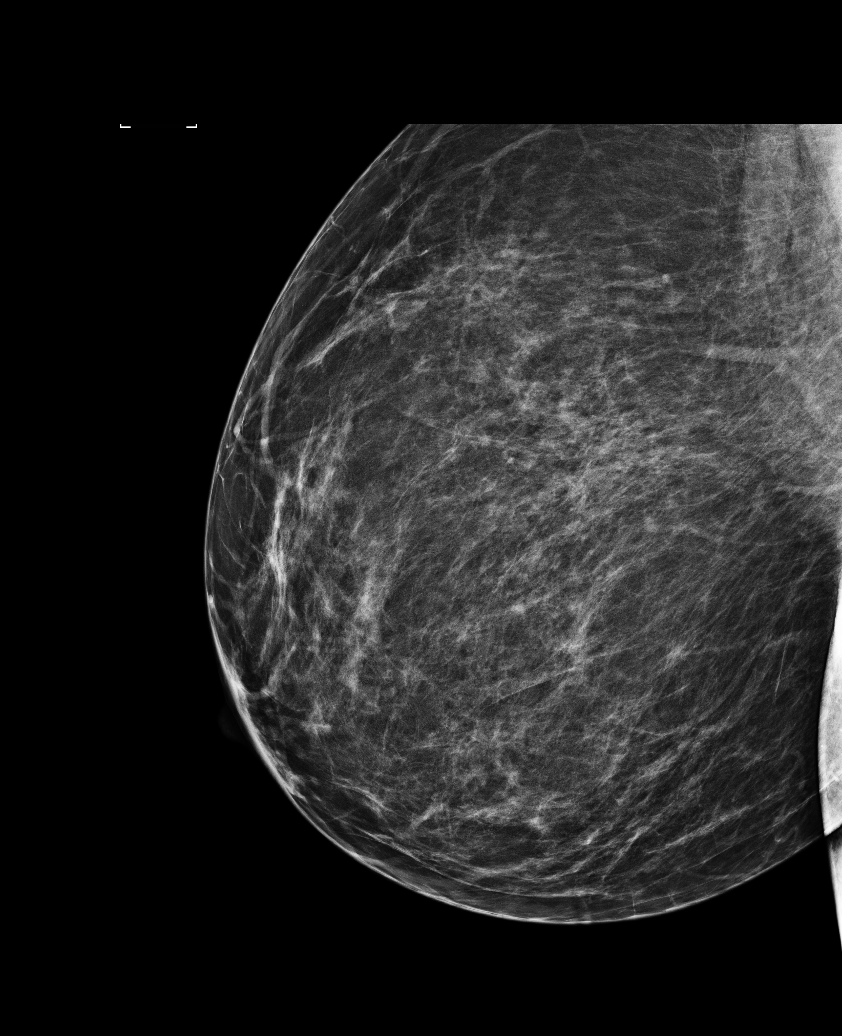

[R MLO (2 of 2)]
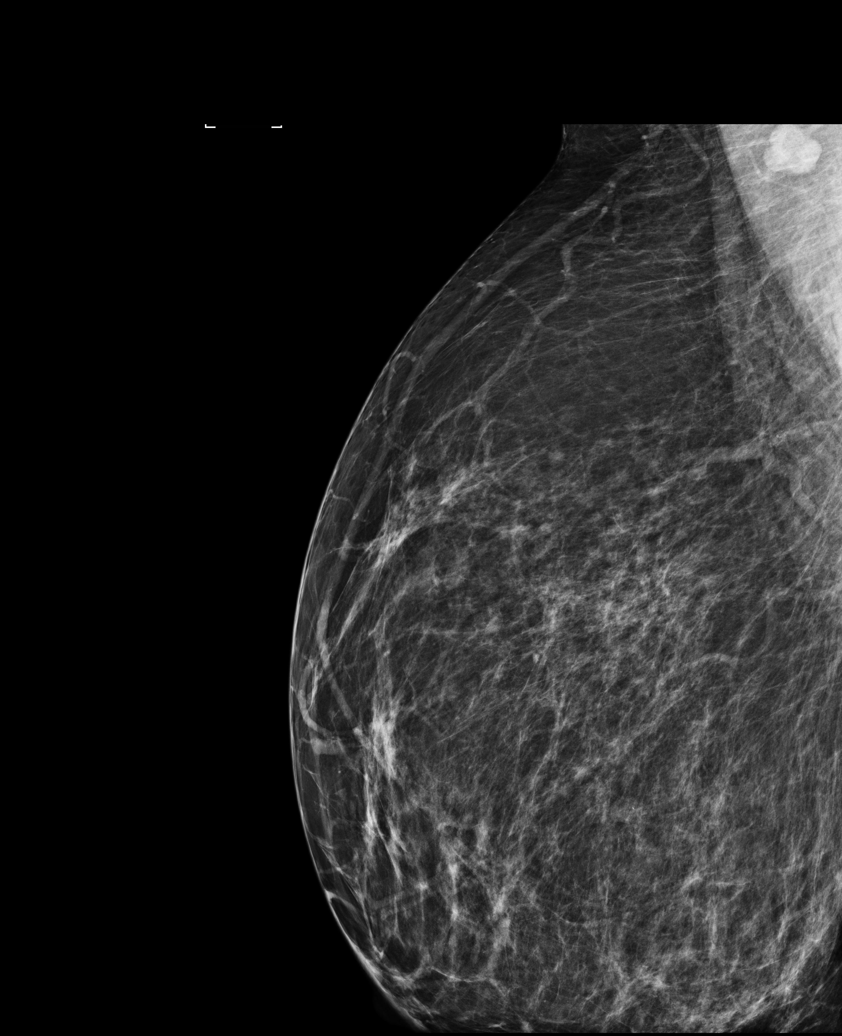

[R CC]
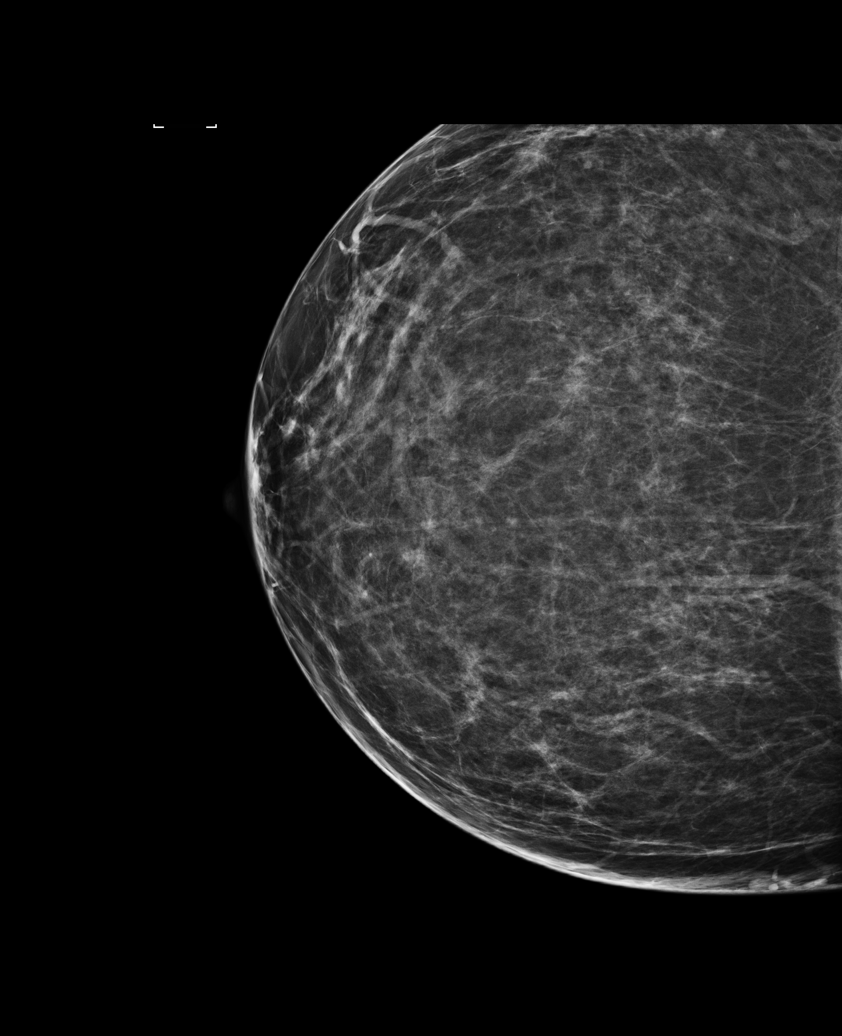

[6 of 6 positions shown; findings below may reference images not displayed]

ACR Breast Density Category b: There are scattered areas of
fibroglandular density.
FINDINGS: There are no findings suspicious for malignancy. Images were
processed with CAD.
IMPRESSION: No mammographic evidence of malignancy. A result letter of this
screening mammogram will be mailed directly to the patient.

RECOMMENDATION:
Screening mammogram in one year. (Code:AS-G-LCT)

BI-RADS CATEGORY  1: Negative.

## 2016-09-11 ENCOUNTER — Emergency Department (HOSPITAL_COMMUNITY): Payer: BLUE CROSS/BLUE SHIELD

## 2016-09-11 ENCOUNTER — Emergency Department (HOSPITAL_COMMUNITY)
Admission: EM | Admit: 2016-09-11 | Discharge: 2016-09-11 | Disposition: A | Discharge status: Home / Self Care | Payer: BLUE CROSS/BLUE SHIELD | Attending: Emergency Medicine | Admitting: Emergency Medicine

## 2016-09-11 ENCOUNTER — Encounter (HOSPITAL_COMMUNITY): Payer: Self-pay

## 2016-09-11 DIAGNOSIS — I10 Essential (primary) hypertension: Secondary | ICD-10-CM | POA: Insufficient documentation

## 2016-09-11 DIAGNOSIS — R109 Unspecified abdominal pain: Secondary | ICD-10-CM

## 2016-09-11 DIAGNOSIS — R1084 Generalized abdominal pain: Secondary | ICD-10-CM | POA: Diagnosis not present

## 2016-09-11 DIAGNOSIS — Z79899 Other long term (current) drug therapy: Secondary | ICD-10-CM | POA: Diagnosis not present

## 2016-09-11 LAB — COMPREHENSIVE METABOLIC PANEL
ALBUMIN: 4.1 g/dL (ref 3.5–5.0)
ALT: 17 U/L (ref 14–54)
ANION GAP: 11 (ref 5–15)
AST: 19 U/L (ref 15–41)
Alkaline Phosphatase: 51 U/L (ref 38–126)
BILIRUBIN TOTAL: 0.6 mg/dL (ref 0.3–1.2)
BUN: 10 mg/dL (ref 6–20)
CO2: 20 mmol/L — AB (ref 22–32)
Calcium: 8.8 mg/dL — ABNORMAL LOW (ref 8.9–10.3)
Chloride: 106 mmol/L (ref 101–111)
Creatinine, Ser: 1 mg/dL (ref 0.44–1.00)
GFR calc non Af Amer: >60 mL/min (ref >60)
GLUCOSE: 102 mg/dL — AB (ref 65–99)
POTASSIUM: 3.7 mmol/L (ref 3.5–5.1)
SODIUM: 137 mmol/L (ref 135–145)
TOTAL PROTEIN: 7.5 g/dL (ref 6.5–8.1)

## 2016-09-11 LAB — CBC
HEMATOCRIT: 37.5 % (ref 36.0–46.0)
HEMOGLOBIN: 12.2 g/dL (ref 12.0–15.0)
MCH: 26.2 pg (ref 26.0–34.0)
MCHC: 32.5 g/dL (ref 30.0–36.0)
MCV: 80.6 fL (ref 78.0–100.0)
Platelets: 317 10*3/uL (ref 150–400)
RBC: 4.65 MIL/uL (ref 3.87–5.11)
RDW: 14 % (ref 11.5–15.5)
WBC: 8.4 10*3/uL (ref 4.0–10.5)

## 2016-09-11 LAB — URINALYSIS, ROUTINE W REFLEX MICROSCOPIC
Glucose, UA: NEGATIVE mg/dL
Ketones, ur: NEGATIVE mg/dL
NITRITE: NEGATIVE
PH: 8 (ref 5.0–8.0)
Protein, ur: 30 mg/dL — AB
SPECIFIC GRAVITY, URINE: 1.024 (ref 1.005–1.030)

## 2016-09-11 LAB — PREGNANCY, URINE: Preg Test, Ur: NEGATIVE

## 2016-09-11 LAB — I-STAT BETA HCG BLOOD, ED (MC, WL, AP ONLY): I-stat hCG, quantitative: <5 m[IU]/mL (ref <5)

## 2016-09-11 LAB — LIPASE, BLOOD: Lipase: 29 U/L (ref 11–51)

## 2016-09-11 LAB — URINE MICROSCOPIC-ADD ON: BACTERIA UA: NONE SEEN

## 2016-09-11 MED ORDER — MAGNESIUM CITRATE PO SOLN: 1.0000 | Freq: Once | ORAL | 1 refills | Status: AC

## 2016-09-11 MED ORDER — GI COCKTAIL ~~LOC~~
30.0000 mL | Freq: Once | ORAL | Status: AC
  Administered 2016-09-11: 30 mL via ORAL
  Filled 2016-09-11: qty 30

## 2016-09-11 MED ORDER — DICYCLOMINE HCL 20 MG PO TABS: 20.0000 mg | ORAL_TABLET | Freq: Two times a day (BID) | ORAL | 0 refills | Status: DC

## 2016-09-11 NOTE — ED Notes (Signed)
Pt transported to xray 

## 2016-09-11 NOTE — Discharge Instructions (Signed)
Take bentyl as prescribed as needed for spasms. Try magnesium citrate for constipation. Drink plenty of fluids follow up if not improving.

## 2016-09-11 NOTE — ED Provider Notes (Signed)
[facility]-EMERGENCY DEPT Provider Note   CSN: [id number] Arrival date & time: 09/11/16  1356     History   Chief Complaint Chief Complaint  Patient presents with  . Abdominal Cramping  . Emesis    HPI Karla Ward is a 45 y.o. female.  HPI Karla Ward is a 45 y.o. female with hx of gastritis, HTN, vertigo, presents to ED with complaint of abdominal pain. Pt states her pain started yesterday. States pain is generalized, does not radiate. Pain is dull, sharp pain comes and goes, describing it as "cramping." Reports associated nausea, 1 episode of bilioous emesis yesterday. Denies any diarrhea. Last bowel movement 2 days ago. States she has not had any appetite due to nausea. Reports she has been taking Zofran, Prilosec, which has not helped. Denies any urinary symptoms. Denies any fever or chills. Denies any back pain. No vaginal discharge or bleeding. No history of abdominal surgeries. Reports history of acid reflux and Barrett esophagus that has had to be stretched in the past, denies similar symptoms to that.  Past Medical History:  Diagnosis Date  . Gastritis   . GERD (gastroesophageal reflux disease)   . Hypertension   . Status post dilation of esophageal narrowing   . Vertigo     Patient Active Problem List   Diagnosis Date Noted  . Menorrhagia 05/30/2016  . Esophageal reflux   . Sensation of foreign body in esophagus 08/13/2015  . GERD (gastroesophageal reflux disease) 08/13/2015  . Palpitations 11/22/2012  . Stomach upset 11/22/2012    Past Surgical History:  Procedure Laterality Date  . ESOPHAGOGASTRODUODENOSCOPY N/A 09/02/2015   [id number]:[id number]:mild non-erosive gastritis/stricture at the gastroesophageal junction   . NONE TO DATE  08/13/15  . SAVORY DILATION  09/02/2015   Procedure: SAVORY DILATION;  Surgeon: West BaliSandi L Fields, MD;  Location: [facility];  Service: Endoscopy;;    OB History    No data available       Home Medications    Prior to Admission  medications   Medication Sig Start Date End Date Taking? Authorizing Provider  amLODipine (NORVASC) 5 MG tablet Take 5 mg by mouth daily.    Historical Provider, MD  meclizine (ANTIVERT) 25 MG tablet Take 25 mg by mouth 3 (three) times daily as needed for dizziness.    Historical Provider, MD  medroxyPROGESTERone (DEPO-PROVERA) 150 MG/ML injection Inject 150 mg into the muscle every 3 (three) months.    Historical Provider, MD  megestrol (MEGACE) 40 MG tablet Take 1 tablet (40 mg total) by mouth 3 (three) times daily. While having breakthru bleeding.once daily x 7 d after bleeding stops, then d/c 04/22/16   Tilda BurrowJohn V Ferguson, MD  omeprazole (PRILOSEC) 20 MG capsule Take 20 mg by mouth daily.     Historical Provider, MD  ondansetron (ZOFRAN ODT) 4 MG disintegrating tablet Take 1 tablet (4 mg total) by mouth every 8 (eight) hours as needed for nausea or vomiting. 08/09/15   Mercedes Camprubi-Soms, PA-C    Family History Family History  Problem Relation Age of Onset  . Throat cancer Father 2265  . Hypertension Father   . Hypertension Mother   . Congestive Heart Failure Sister   . Colon cancer Neg Hx     Social History Social History  Substance Use Topics  . Smoking status: Never Smoker  . Smokeless tobacco: Never Used  . Alcohol use No     Allergies   Benadryl [diphenhydramine hcl (sleep)] and Penicillins   Review of  Systems Review of Systems  Constitutional: Negative for chills and fever.  Respiratory: Negative for cough, chest tightness and shortness of breath.   Cardiovascular: Negative for chest pain, palpitations and leg swelling.  Gastrointestinal: Positive for abdominal pain, nausea and vomiting. Negative for diarrhea.  Genitourinary: Negative for dysuria, flank pain, pelvic pain, vaginal bleeding, vaginal discharge and vaginal pain.  Musculoskeletal: Negative for arthralgias, myalgias, neck pain and neck stiffness.  Skin: Negative for rash.  Neurological: Negative for  dizziness, weakness and headaches.  All other systems reviewed and are negative.    Physical Exam Updated Vital Signs BP 107/74   Pulse 89   Temp 98.4 F (36.9 C) (Oral)   Resp 18   Ht 5\' 4"  (1.626 m)   Wt 81.6 kg   SpO2 100%   BMI 30.90 kg/m   Physical Exam  Constitutional: She appears well-developed and well-nourished. No distress.  HENT:  Head: Normocephalic.  Eyes: Conjunctivae are normal.  Neck: Neck supple.  Cardiovascular: Normal rate, regular rhythm and normal heart sounds.   Pulmonary/Chest: Effort normal and breath sounds normal. No respiratory distress. She has no wheezes. She has no rales.  Abdominal: Soft. Bowel sounds are normal. She exhibits no distension. There is no tenderness. There is no rebound and no guarding.  Diffuse tenderness, worse in epigastric area  Musculoskeletal: She exhibits no edema.  Neurological: She is alert.  Skin: Skin is warm and dry.  Psychiatric: She has a normal mood and affect. Her behavior is normal.  Nursing note and vitals reviewed.    ED Treatments / Results  Labs (all labs ordered are listed, but only abnormal results are displayed) Labs Reviewed  URINALYSIS, ROUTINE W REFLEX MICROSCOPIC (NOT AT [facility]) - Abnormal; Notable for the following:       Result Value   Color, Urine AMBER (*)    APPearance CLOUDY (*)    Hgb urine dipstick LARGE (*)    Bilirubin Urine SMALL (*)    Protein, ur 30 (*)    Leukocytes, UA TRACE (*)    All other components within normal limits  URINE MICROSCOPIC-ADD ON - Abnormal; Notable for the following:    Squamous Epithelial / LPF 0-5 (*)    All other components within normal limits  CBC  LIPASE, BLOOD  COMPREHENSIVE METABOLIC PANEL  PREGNANCY, URINE  I-STAT BETA HCG BLOOD, ED ([facility], [facility], [facility] ONLY)    EKG  EKG Interpretation None       Radiology Dg Abd 2 Views  Result Date: 09/11/2016 CLINICAL DATA:  Lower abdominal pain since yesterday with some nausea and vomiting last night.  EXAM: ABDOMEN - 2 VIEW COMPARISON:  None. FINDINGS: Upright film shows no evidence for intraperitoneal free air. There is no evidence for gaseous bowel dilation to suggest obstruction. Moderate stool volume along the length of the colon. Visualized bony anatomy unremarkable. IMPRESSION: No evidence for bowel perforation or obstruction. Electronically Signed   By: Kennith Center M.D.   On: 09/11/2016 16:36    Procedures Procedures (including critical care time)  Medications Ordered in ED Medications  gi cocktail (Maalox,Lidocaine,Donnatal) (not administered)     Initial Impression / Assessment and Plan / ED Course  I have reviewed the triage vital signs and the nursing notes.  Pertinent labs & imaging results that were available during my care of the patient were reviewed by me and considered in my medical decision making (see chart for details).  Clinical Course    Pt with diffuse abdominal pain, worse  in epigastric area. Will check labs, UA, try GI cocktail. Pt in NAD. VS normal. No acute abdomen.   Labs unremarkable. Xray negative, but does show moderate stool. Question constipation. Will try bentyl for spasms. Will try mag citrate for constipation. Stable for dc home. No peritoneal signs on exam. VS normal. No vomiting. No other complaints. Return precaution discussed.   Vitals:   09/11/16 1410 09/11/16 1500 09/11/16 1607  BP: 136/64 107/74 132/73  Pulse: 98 89 81  Resp: 18 18 16   Temp: 98.4 F (36.9 C)    TempSrc: Oral    SpO2: 100% 100% 100%  Weight: 81.6 kg    Height: 5\' 4"  (1.626 m)        Final Clinical Impressions(s) / ED Diagnoses   Final diagnoses:  Abdominal pain  Abdominal pain, unspecified abdominal location    New Prescriptions Discharge Medication List as of 09/11/2016  4:56 PM    START taking these medications   Details  dicyclomine (BENTYL) 20 MG tablet Take 1 tablet (20 mg total) by mouth 2 (two) times daily., Starting Sun 09/11/2016, Print      magnesium citrate SOLN Take 296 mLs (1 Bottle total) by mouth once., Starting Sun 09/11/2016, Print         Jaynie Crumbleatyana Fany Cavanaugh, PA-C 09/11/16 2238    Donnetta HutchingBrian Cook, MD 09/16/16 [phone]

## 2016-09-11 NOTE — ED Triage Notes (Signed)
Patient here with abdominal cramping and vomiting that started yesterday, denies diarrhea. Used zofran with no relief. No vomiting on arrival

## 2016-09-12 ENCOUNTER — Telehealth: Payer: Self-pay

## 2016-09-12 NOTE — Telephone Encounter (Signed)
Pt said she was seen at the ED yesterday for abdominal pain and is still having some abdominal pain. She rates it at an 8 this morning. She said they gave her GI cocktail and that made her abdominal pain worse and said it caused her to have chest pain, that she was not having initially. She said she has not eaten today, they told her to have just clear liquids for a couple of days and I encouraged her to do some liquids.   She said they prescribed Bentyl, but she did not want to get it until she asked here if that was OK.  I told her if that is what they prescribed she should get it.   I told her if her pain gets worse that she should go to the ED, that I would let Wynne DustEric Gill, NP know since he was the last one that seen her in the office.  She has an appt here on 09/14/2016 at 3:30 pm with Minerva AreolaEric.

## 2016-09-12 NOTE — Telephone Encounter (Signed)
No further recommendations. Has appt upcoming in 2 days

## 2016-09-12 NOTE — Telephone Encounter (Signed)
I called pt back on [phone] and left VM that she just needs to keep appt this week and to go to ED if she worsens.

## 2016-09-14 ENCOUNTER — Encounter: Payer: Self-pay | Admitting: Nurse Practitioner

## 2016-09-14 ENCOUNTER — Ambulatory Visit (INDEPENDENT_AMBULATORY_CARE_PROVIDER_SITE_OTHER): Payer: BLUE CROSS/BLUE SHIELD | Admitting: Nurse Practitioner

## 2016-09-14 VITALS — BP 143/70 | HR 83 | Temp 98.0°F | Ht 64.0 in | Wt 182.2 lb

## 2016-09-14 DIAGNOSIS — R109 Unspecified abdominal pain: Secondary | ICD-10-CM | POA: Insufficient documentation

## 2016-09-14 DIAGNOSIS — R112 Nausea with vomiting, unspecified: Secondary | ICD-10-CM | POA: Diagnosis not present

## 2016-09-14 DIAGNOSIS — K219 Gastro-esophageal reflux disease without esophagitis: Secondary | ICD-10-CM

## 2016-09-14 DIAGNOSIS — R1084 Generalized abdominal pain: Secondary | ICD-10-CM

## 2016-09-14 MED ORDER — OMEPRAZOLE 20 MG PO CPDR: 20.0000 mg | DELAYED_RELEASE_CAPSULE | Freq: Two times a day (BID) | ORAL | 2 refills | Status: DC

## 2016-09-14 NOTE — Assessment & Plan Note (Signed)
Abdominal pain is also resolving in addition to her nausea and vomiting. She likely had a gastroenteritis. Symptoms are improving. She does have element of constipation per abdominal x-ray and I will have her start Colace daily and if one week no improvement she can add MiraLAX 17 g daily. She is to call us with any worsening or severe symptoms. ER precautions given. Return for follow-up in 2 months.

## 2016-09-14 NOTE — Assessment & Plan Note (Signed)
The patient did have nausea and vomiting but this appears to be improving. Her symptoms, in total, are likely due to a gastroenteritis. She does have some residual nausea tends to be worse in the morning. Also some breakthrough GERD symptoms after all the nausea and vomiting she has had. I will double her omeprazole to twice a day for the next 3 months and consider bringing back to once a day. Refill of omeprazole sent 10. Return for follow-up in 2 months.

## 2016-09-14 NOTE — Assessment & Plan Note (Signed)
Thank you GERD symptoms after taking GI cocktail. I will double her PPI for 3 months. Return for follow-up in 2 months.

## 2016-09-14 NOTE — Progress Notes (Signed)
Referring Provider: Gareth Ward, Steve, MD Primary Care Physician:  Karla ObeyStephen D Knowlton, MD Primary GI:  Dr. Darrick PennaFields  Chief Complaint  Patient presents with  . Follow-up    HPI:   Karla Ward is a 45 y.o. female who presents for heartburn/reflux. The patient was last seen in our office 03/23/2016 for GERD. At that time she was noted to be doing well overall, GERD symptoms well controlled, is learning foods to avoid. Has started avoiding carbonated beverages and taking Prilosec daily with minimal breakthrough symptoms noted. Did have 2 days of abdominal pain which self resolved which she felt was diarrhea related. Recommended continue medications, avoid NSAIDs and trigger foods, return for follow-up in one year or sooner if needed.  She was evaluated in the emergency department on 09/11/2016 for abdominal pain and vomiting. Symptoms started day prior to presentation, abdominal pain generalized without radiation and described as dull, sharp, intermittent and "cramping." Has had associated nausea and one episode of bilious emesis the day prior to presentation. Her normal medications have not helped. Admitted history of reflux and Barrett's esophagus which required esophageal dilation previously. (**NOTE: No mention of Barrett's esophagus on her previous endoscopy**). On exam her abdominal tenderness was noted worsening epigastric region, CBC, lipase, CMP all normal. Diagnostic abdominal x-ray performed which found no evidence for intraperitoneal free air, gaseous bile dilation to suggest obstruction. Findings include no evidence for bowel perforation or obstruction, moderate stool volume along the length of the colon.  She was given Bentyl for spasms, mag citrate for constipation, discharged home.  Today she states she's feeling a lot better. She had bad GERD after GI cocktail. This has improved. Has some breakthrough symptoms at times despite PPI. Has been eating clear liquids and has started to  advance to soft foods and is generally tolerating this well. Has a bowel movement about ever 2-3 days, occasionally hard stools requiring straining. Denies hematochezia, melena. N/V improved. Still some occasional nausea in the morning when she first wakes up. Denies chest pain, dyspnea, dizziness, lightheadedness, syncope, near syncope. Denies any other upper or lower GI symptoms.  Past Medical History:  Diagnosis Date  . Gastritis   . GERD (gastroesophageal reflux disease)   . Hypertension   . Status post dilation of esophageal narrowing   . Vertigo     Past Surgical History:  Procedure Laterality Date  . ESOPHAGOGASTRODUODENOSCOPY N/A 09/02/2015   VWU:[id number]:mild non-erosive gastritis/stricture at the gastroesophageal junction   . NONE TO DATE  08/13/15  . SAVORY DILATION  09/02/2015   Procedure: SAVORY DILATION;  Surgeon: West BaliSandi L Fields, MD;  Location: [facility];  Service: Endoscopy;;    Current Outpatient Prescriptions  Medication Sig Dispense Refill  . amLODipine (NORVASC) 5 MG tablet Take 5 mg by mouth daily.    . fluticasone (FLONASE) 50 MCG/ACT nasal spray Place 1 spray into both nostrils daily as needed for allergies.    Marland Kitchen. meclizine (ANTIVERT) 25 MG tablet Take 25 mg by mouth 3 (three) times daily as needed for dizziness.    . medroxyPROGESTERone (DEPO-PROVERA) 150 MG/ML injection Inject 150 mg into the muscle every 3 (three) months.    . megestrol (MEGACE) 40 MG tablet Take 1 tablet (40 mg total) by mouth 3 (three) times daily. While having breakthru bleeding.once daily x 7 d after bleeding stops, then d/c 45 tablet 2  . omeprazole (PRILOSEC) 20 MG capsule Take 1 capsule (20 mg total) by mouth 2 (two) times daily before a meal. 60  capsule 2   No current facility-administered medications for this visit.     Allergies as of 09/14/2016 - Review Complete 09/14/2016  Allergen Reaction Noted  . Benadryl [diphenhydramine hcl (sleep)]  12/26/2014  . Penicillins Hives 11/19/2012     Family History  Problem Relation Age of Onset  . Throat cancer Father 1665  . Hypertension Father   . Hypertension Mother   . Congestive Heart Failure Sister   . Colon cancer Neg Hx     Social History   Social History  . Marital status: Married    Spouse name: N/A  . Number of children: N/A  . Years of education: N/A   Social History Main Topics  . Smoking status: Never Smoker  . Smokeless tobacco: Never Used  . Alcohol use No  . Drug use: No  . Sexual activity: Yes    Birth control/ protection: Injection   Other Topics Concern  . None   Social History Narrative  . None    Review of Systems: Complete ROS negative except as per HPI.   Physical Exam: BP (!) 143/70   Pulse 83   Temp 98 F (36.7 C) (Oral)   Ht 5\' 4"  (1.626 m)   Wt 182 lb 3.2 oz (82.6 kg)   BMI 31.27 kg/m  General:   Alert and oriented. Pleasant and cooperative. Well-nourished and well-developed.  Ears:  Normal auditory acuity. Cardiovascular:  S1, S2 present without murmurs appreciated. Extremities without clubbing or edema. Respiratory:  Clear to auscultation bilaterally. No wheezes, rales, or rhonchi. No distress.  Gastrointestinal:  +BS, soft, non-tender and non-distended. No HSM noted. No guarding or rebound. No masses appreciated.  Rectal:  Deferred  Musculoskalatal:  Symmetrical without gross deformities. Neurologic:  Alert and oriented x4;  grossly normal neurologically. Psych:  Alert and cooperative. Normal mood and affect. Heme/Lymph/Immune: No excessive bruising noted.    09/14/2016 4:47 PM   Disclaimer: This note was dictated with voice recognition software. Similar sounding words can inadvertently be transcribed and may not be corrected upon review.

## 2016-09-14 NOTE — Patient Instructions (Signed)
1. I have refilled her omeprazole. I change the dosing to twice a day for the next 3 months. 2. Start taking daily Colace stool softener, available over-the-counter. 3. If after 1 week you're still feeling constipated you can start taking MiraLAX powder. Add 17 g into a beverage of your choice and do this once a day. 4. Call us with any severe or worsening symptoms. 5. Return for follow-up in 2 months.

## 2016-09-15 ENCOUNTER — Telehealth: Payer: Self-pay

## 2016-09-15 ENCOUNTER — Encounter: Payer: Self-pay | Admitting: Gastroenterology

## 2016-09-15 DIAGNOSIS — K219 Gastro-esophageal reflux disease without esophagitis: Secondary | ICD-10-CM

## 2016-09-15 DIAGNOSIS — R1310 Dysphagia, unspecified: Secondary | ICD-10-CM

## 2016-09-15 MED ORDER — LIDOCAINE VISCOUS 2 % MT SOLN: 15.0000 mL | Freq: Four times a day (QID) | OROMUCOSAL | 0 refills | Status: DC | PRN

## 2016-09-15 NOTE — Telephone Encounter (Signed)
Pt came by the office while [name] was at lunch- she is still c/o her chest hurting and it feels like whatever she eats, gets stuck. She had a banana this morning and it felt like it got stuck in her throat. She has had water today too and said it is going down with no problem. She said she had this yesterday when she was seen in the office but feels like its worse today. She took her omeprazole last night and this morning. [name] has gone to see a consult at the hospital. I will have to send him a message. I advised her that I would let [name] and [name] know and someone would get back with her at [phone]. I also advised her that if the pain got worse or if she couldn't get liquids to go down or even her own saliva to go down, she should go to the ED.   She wants to know what she should do? .Karla Ward

## 2016-09-15 NOTE — Telephone Encounter (Signed)
I am putting in an order for viscous lidocaine to her pharmacy.  Also ordering BPE for evaluation of dysphagia.  If continued/worsening chest pain, should be evaluated by the ER to make sure it's not cardiac-related.  Please notify the patient.

## 2016-09-15 NOTE — Telephone Encounter (Signed)
Pt is aware. Routing to [facility] to schedule the BPE tomorrow.

## 2016-09-15 NOTE — Progress Notes (Signed)
cc'ed to pcp °

## 2016-09-16 NOTE — Telephone Encounter (Signed)
LMOVM and informed pt of BPE scheduled for 09/19/16 at 9:00am. [facility] at 8:45am. NPO 3 hours prior to test.

## 2016-09-19 ENCOUNTER — Ambulatory Visit (HOSPITAL_COMMUNITY): Payer: BLUE CROSS/BLUE SHIELD

## 2016-11-02 IMAGING — CR DG CHEST 2V
2 series · 2 of 2 positions shown · non-contrast
Comparison: 09/17/2015; 07/17/2015; 10/08/2014 ; 08/03/2009; chest
CT - 11/19/2012

CLINICAL DATA: Productive cough. Shortness of breath and wheezing
for the past 4 days. History of hypertension.

EXAM:
CHEST  2 VIEW

[w chest pa]
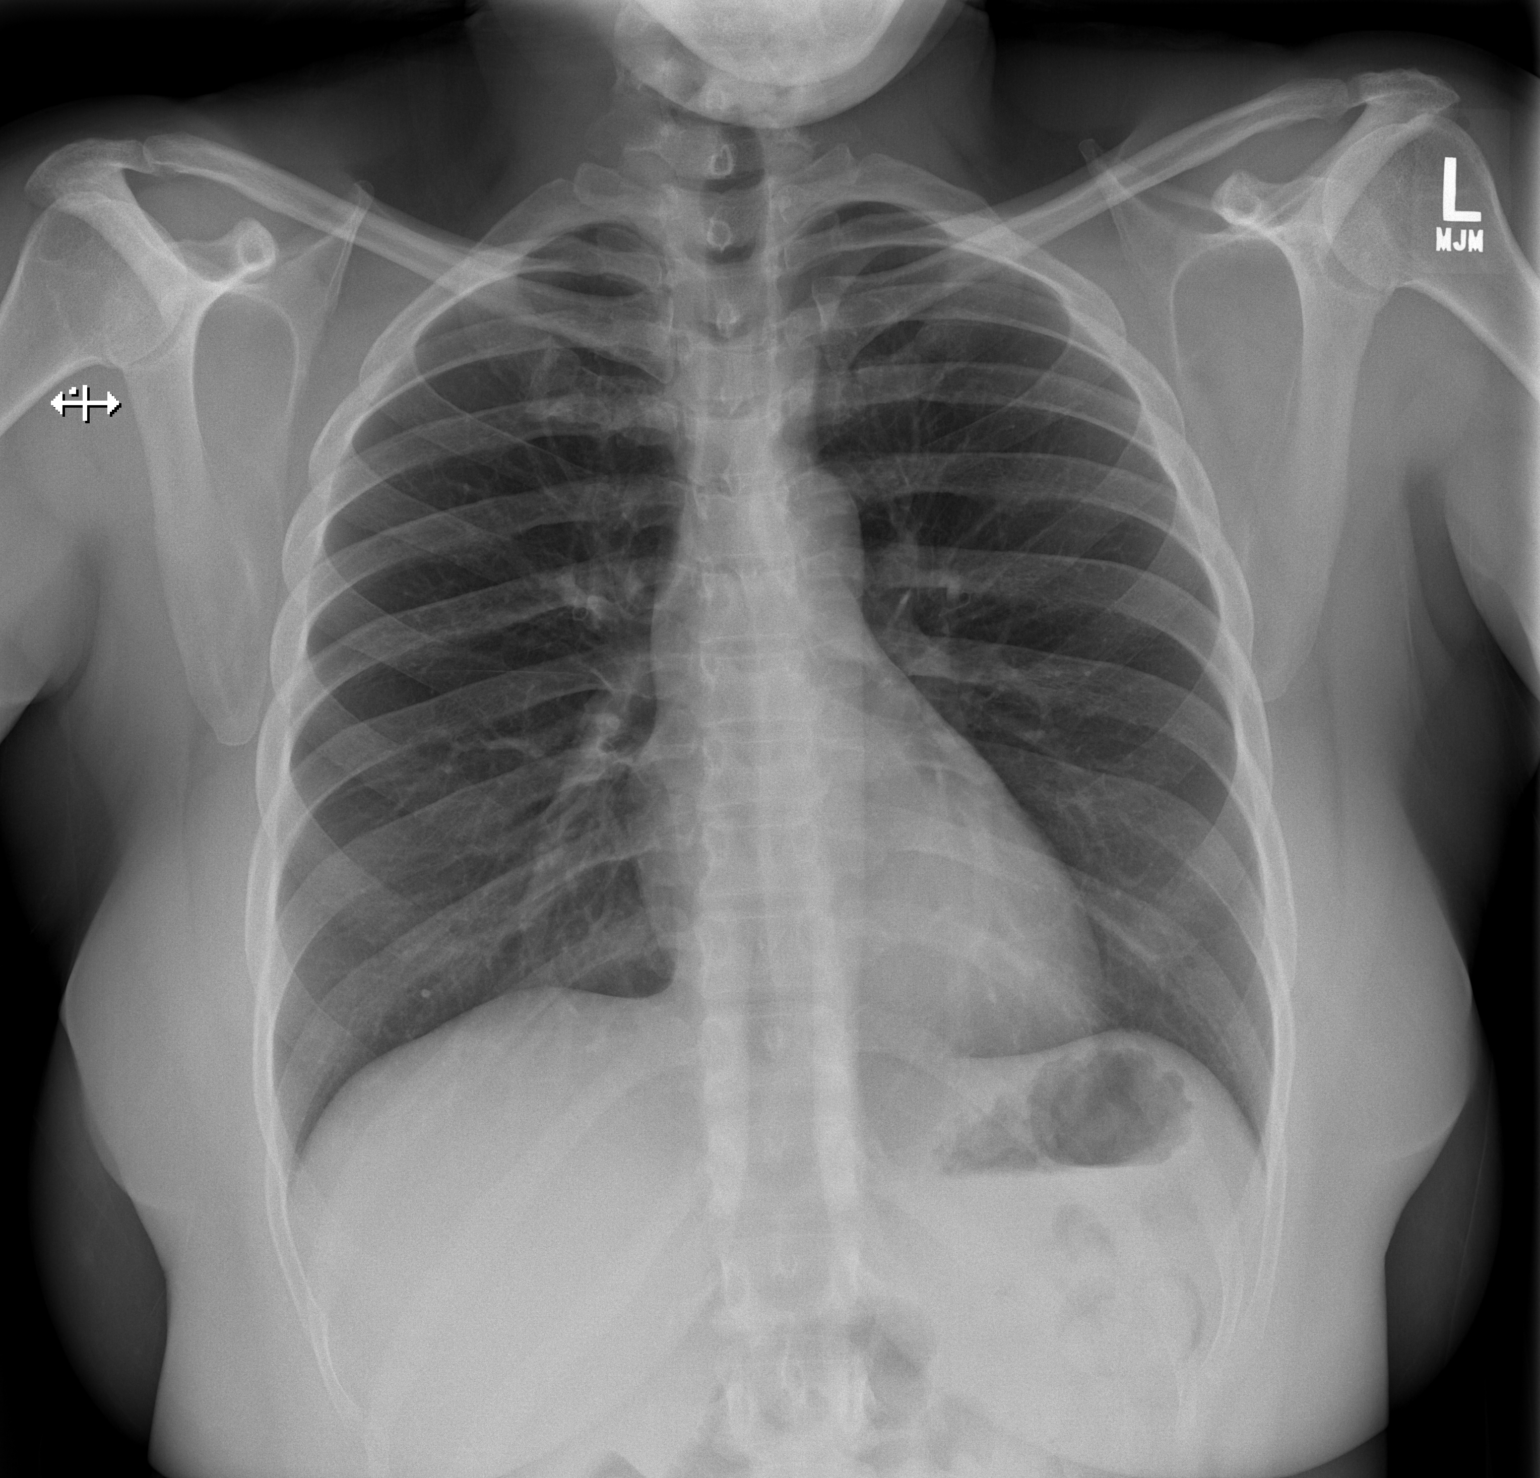

[w chest lat]
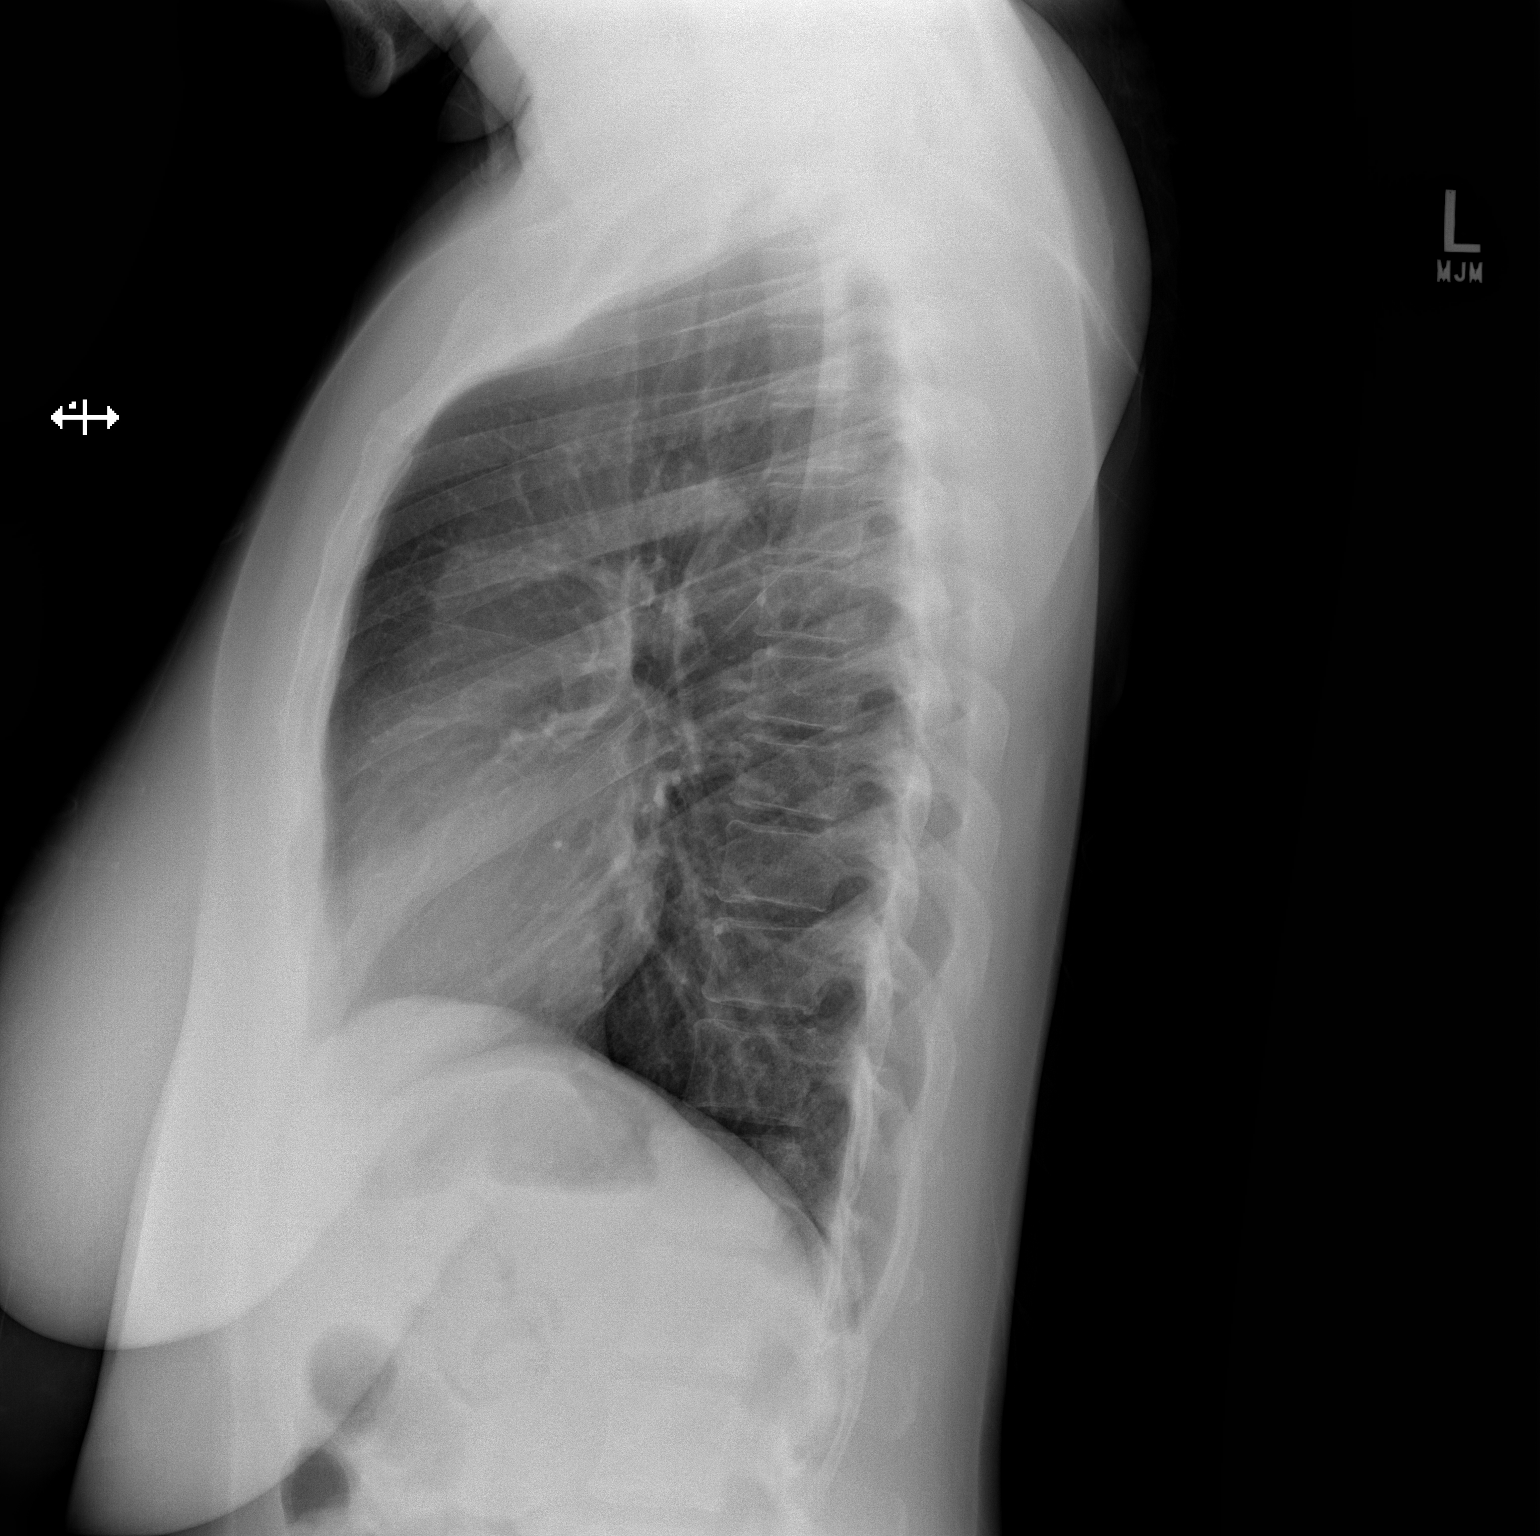

[2 of 2 positions shown; findings below may reference images not displayed]

FINDINGS: Grossly unchanged cardiac silhouette and mediastinal contours. There
is persistent mild elevation/ eventration involving the medial
aspect the right hemidiaphragm. No focal airspace opacities. No
pleural effusion or pneumothorax. No evidence of edema. No acute
osseous abnormalities.
IMPRESSION: No acute cardiopulmonary disease.

## 2016-11-16 ENCOUNTER — Ambulatory Visit: Payer: BLUE CROSS/BLUE SHIELD | Admitting: Nurse Practitioner

## 2016-12-08 ENCOUNTER — Ambulatory Visit: Payer: BLUE CROSS/BLUE SHIELD | Admitting: Nurse Practitioner

## 2016-12-08 ENCOUNTER — Telehealth: Payer: Self-pay | Admitting: Nurse Practitioner

## 2016-12-08 ENCOUNTER — Encounter: Payer: Self-pay | Admitting: Nurse Practitioner

## 2016-12-08 NOTE — Telephone Encounter (Signed)
PATIENT WAS A NO SHOW AND LETTER SENT  °

## 2016-12-08 NOTE — Telephone Encounter (Signed)
Noted  

## 2017-01-11 ENCOUNTER — Other Ambulatory Visit (HOSPITAL_COMMUNITY): Payer: Self-pay | Admitting: Family Medicine

## 2017-01-11 ENCOUNTER — Ambulatory Visit (HOSPITAL_COMMUNITY)
Admission: RE | Admit: 2017-01-11 | Discharge: 2017-01-11 | Disposition: A | Discharge status: Home / Self Care | Payer: BLUE CROSS/BLUE SHIELD | Source: Ambulatory Visit | Attending: Family Medicine | Admitting: Family Medicine

## 2017-01-11 DIAGNOSIS — M79605 Pain in left leg: Secondary | ICD-10-CM | POA: Diagnosis present

## 2017-01-31 ENCOUNTER — Encounter: Payer: Self-pay | Admitting: Gastroenterology

## 2017-03-24 ENCOUNTER — Ambulatory Visit (INDEPENDENT_AMBULATORY_CARE_PROVIDER_SITE_OTHER): Payer: BLUE CROSS/BLUE SHIELD | Admitting: Obstetrics & Gynecology

## 2017-03-24 ENCOUNTER — Encounter: Payer: Self-pay | Admitting: Obstetrics & Gynecology

## 2017-03-24 VITALS — BP 142/90 | HR 77 | Ht 64.0 in | Wt 189.0 lb

## 2017-03-24 DIAGNOSIS — N946 Dysmenorrhea, unspecified: Secondary | ICD-10-CM

## 2017-03-24 DIAGNOSIS — N921 Excessive and frequent menstruation with irregular cycle: Secondary | ICD-10-CM | POA: Diagnosis not present

## 2017-03-24 NOTE — Progress Notes (Signed)
Preoperative History and Physical  Karla Ward is a 46 y.o. [phone] with Patient's last menstrual period was 03/11/2017. admitted for a hysteroscopy uterine curettage endometrial ablation for abnormal uterine bleeding increasingly worse on Depo provera. Sometimes lasts for a month at a time, pin, red brown dark brown consistent with a dys synchronous endometrium Pt has been on Depo provera over 10 years and is having to supplement with megestrol frequently due to AUB Is aware that she will require ongoing BCM, maybe mirena or micronor Offered BTL but declined  PMH:    Past Medical History:  Diagnosis Date  . Gastritis   . GERD (gastroesophageal reflux disease)   . Hypertension   . Status post dilation of esophageal narrowing   . Vertigo     PSH:     Past Surgical History:  Procedure Laterality Date  . ESOPHAGOGASTRODUODENOSCOPY N/A 09/02/2015   AVW:UJWJ non-erosive gastritis/stricture at the gastroesophageal junction   . NONE TO DATE  08/13/15  . SAVORY DILATION  09/02/2015   Procedure: SAVORY DILATION;  Surgeon: West Bali, MD;  Location: [facility];  Service: Endoscopy;;    POb/GynH:      OB History    Gravida Para Term Preterm AB Living   2 2   2   2    SAB TAB Ectopic Multiple Live Births           2      SH:   Social History  Substance Use Topics  . Smoking status: Never Smoker  . Smokeless tobacco: Never Used  . Alcohol use No    FH:    Family History  Problem Relation Age of Onset  . Throat cancer Father 62  . Hypertension Father   . Hypertension Mother   . Congestive Heart Failure Sister   . Colon cancer Neg Hx      Allergies:  Allergies  Allergen Reactions  . Benadryl [Diphenhydramine Hcl (Sleep)]     tachycardia  . Penicillins Hives    Has patient had a PCN reaction causing immediate rash, facial/tongue/throat swelling, SOB or lightheadedness with hypotension: pt does not remember  Has patient had a PCN reaction causing severe rash  involving mucus membranes or skin necrosis: No Has patient had a PCN reaction that required hospitalization No Has patient had a PCN reaction occurring within the last 10 years: No      Medications:       Current Outpatient Prescriptions:  .  amLODipine (NORVASC) 5 MG tablet, Take 5 mg by mouth daily., Disp: , Rfl:  .  fluticasone (FLONASE) 50 MCG/ACT nasal spray, Place 1 spray into both nostrils daily as needed for allergies., Disp: , Rfl:  .  meclizine (ANTIVERT) 25 MG tablet, Take 25 mg by mouth 3 (three) times daily as needed for dizziness., Disp: , Rfl:  .  medroxyPROGESTERone (DEPO-PROVERA) 150 MG/ML injection, Inject 150 mg into the muscle every 3 (three) months., Disp: , Rfl:  .  omeprazole (PRILOSEC) 20 MG capsule, Take 1 capsule (20 mg total) by mouth 2 (two) times daily before a meal., Disp: 60 capsule, Rfl: 2  Review of Systems:   Review of Systems  Constitutional: Negative for fever, chills, weight loss, malaise/fatigue and diaphoresis.  HENT: Negative for hearing loss, ear pain, nosebleeds, congestion, sore throat, neck pain, tinnitus and ear discharge.   Eyes: Negative for blurred vision, double vision, photophobia, pain, discharge and redness.  Respiratory: Negative for cough, hemoptysis, sputum production, shortness of breath, wheezing and  stridor.   Cardiovascular: Negative for chest pain, palpitations, orthopnea, claudication, leg swelling and PND.  Gastrointestinal: Positive for abdominal pain. Negative for heartburn, nausea, vomiting, diarrhea, constipation, blood in stool and melena.  Genitourinary: Negative for dysuria, urgency, frequency, hematuria and flank pain.  Musculoskeletal: Negative for myalgias, back pain, joint pain and falls.  Skin: Negative for itching and rash.  Neurological: Negative for dizziness, tingling, tremors, sensory change, speech change, focal weakness, seizures, loss of consciousness, weakness and headaches.  Endo/Heme/Allergies: Negative  for environmental allergies and polydipsia. Does not bruise/bleed easily.  Psychiatric/Behavioral: Negative for depression, suicidal ideas, hallucinations, memory loss and substance abuse. The patient is not nervous/anxious and does not have insomnia.      PHYSICAL EXAM:  Blood pressure (!) 142/90, pulse 77, height 5\' 4"  (1.626 m), weight 189 lb (85.7 kg), last menstrual period 03/11/2017.    Vitals reviewed. Constitutional: She is oriented to person, place, and time. She appears well-developed and well-nourished.  HENT:  Head: Normocephalic and atraumatic.  Right Ear: External ear normal.  Left Ear: External ear normal.  Nose: Nose normal.  Mouth/Throat: Oropharynx is clear and moist.  Eyes: Conjunctivae and EOM are normal. Pupils are equal, round, and reactive to light. Right eye exhibits no discharge. Left eye exhibits no discharge. No scleral icterus.  Neck: Normal range of motion. Neck supple. No tracheal deviation present. No thyromegaly present.  Cardiovascular: Normal rate, regular rhythm, normal heart sounds and intact distal pulses.  Exam reveals no gallop and no friction rub.   No murmur heard. Respiratory: Effort normal and breath sounds normal. No respiratory distress. She has no wheezes. She has no rales. She exhibits no tenderness.  GI: Soft. Bowel sounds are normal. She exhibits no distension and no mass. There is tenderness. There is no rebound and no guarding.  Genitourinary:       Vulva is normal without lesions Vagina is pink moist without discharge Cervix normal in appearance and pap is normal Uterus is normal size, contour, position, consistency, mobility, non-tender Adnexa is negative with normal sized ovaries by sonogram  Musculoskeletal: Normal range of motion. She exhibits no edema and no tenderness.  Neurological: She is alert and oriented to person, place, and time. She has normal reflexes. She displays normal reflexes. No cranial nerve deficit. She exhibits  normal muscle tone. Coordination normal.  Skin: Skin is warm and dry. No rash noted. No erythema. No pallor.  Psychiatric: She has a normal mood and affect. Her behavior is normal. Judgment and thought content normal.    Labs: No results found for this or any previous visit (from the past 336 hour(s)).  EKG: Orders placed or performed during the hospital encounter of 09/17/15  . ED EKG  . ED EKG  . EKG 12-Lead  . EKG 12-Lead  . EKG 12-Lead  . EKG 12-Lead    Imaging Studies: No results found.    Assessment: AUB  Dysmenorrhea Unresponsive to increasing megestrol supplementation Patient Active Problem List   Diagnosis Date Noted  . Nausea with vomiting 09/14/2016  . Abdominal pain 09/14/2016  . Menorrhagia 05/30/2016  . Esophageal reflux   . Sensation of foreign body in esophagus 08/13/2015  . GERD (gastroesophageal reflux disease) 08/13/2015  . Palpitations 11/22/2012  . Stomach upset 11/22/2012    Plan: Hysteroscopy uterine curettage endometrial ablation using NovaSure  EURE,LUTHER H 03/24/2017 9:17 AM       Face to face time:  25 minutes  Greater than 50% of the visit time was  spent in counseling and coordination of care with the patient.  The summary and outline of the counseling and care coordination is summarized in the note above.   All questions were answered.

## 2017-03-30 ENCOUNTER — Ambulatory Visit (INDEPENDENT_AMBULATORY_CARE_PROVIDER_SITE_OTHER): Payer: BLUE CROSS/BLUE SHIELD

## 2017-03-30 DIAGNOSIS — N946 Dysmenorrhea, unspecified: Secondary | ICD-10-CM | POA: Diagnosis not present

## 2017-03-30 DIAGNOSIS — N921 Excessive and frequent menstruation with irregular cycle: Secondary | ICD-10-CM | POA: Diagnosis not present

## 2017-03-30 NOTE — Progress Notes (Signed)
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 endometrial polyp, no color flow visualized,normal ovaries bilat,no free fluid,no pain during ultrasound, ovaries appear mobile

## 2017-03-31 NOTE — Patient Instructions (Signed)
Karla Ward  03/31/2017     @PREFPERIOPPHARMACY @   Your procedure is scheduled on  04/05/2017  Report to [facility] at  0850  A.M.  Call this number if you have problems the morning of surgery:  [phone]   Remember:  Do not eat food or drink liquids after midnight.  Take these medicines the morning of surgery with A SIP OF WATER   Norvasc, antivert, prilosec.   Do not wear jewelry, make-up or nail polish.  Do not wear lotions, powders, or perfumes, or deoderant.  Do not shave 48 hours prior to surgery.  Men may shave face and neck.  Do not bring valuables to the hospital.  [facility] is not responsible for any belongings or valuables.  Contacts, dentures or bridgework may not be worn into surgery.  Leave your suitcase in the car.  After surgery it may be brought to your room.  For patients admitted to the hospital, discharge time will be determined by your treatment team.  Patients discharged the day of surgery will not be allowed to drive home.   Name and phone number of your driver:   family Special instructions:  None  Please read over the following fact sheets that you were given. Anesthesia Post-op Instructions and Care and Recovery After Surgery       Dilation and Curettage or Vacuum Curettage Dilation and curettage (D&C) and vacuum curettage are minor procedures. A D&C involves stretching (dilation) the cervix and scraping (curettage) the inside lining of the uterus (endometrium). During a D&C, tissue is gently scraped from the endometrium, starting from the top portion of the uterus down to the lowest part of the uterus (cervix). During a vacuum curettage, the lining and tissue in the uterus are removed with the use of gentle suction. Curettage may be performed to either diagnose or treat a problem. As a diagnostic procedure, curettage is performed to examine tissues from the uterus. A diagnostic curettage may be done if you  have:  Irregular bleeding in the uterus.  Bleeding with the development of clots.  Spotting between menstrual periods.  Prolonged menstrual periods or other abnormal bleeding.  Bleeding after menopause.  No menstrual period (amenorrhea).  A change in size and shape of the uterus.  Abnormal endometrial cells discovered during a Pap test.  As a treatment procedure, curettage may be performed for the following reasons:  Removal of an IUD (intrauterine device).  Removal of retained placenta after giving birth.  Abortion.  Miscarriage.  Removal of endometrial polyps.  Removal of uncommon types of noncancerous lumps (fibroids).  Tell a health care provider about:  Any allergies you have, including allergies to prescribed medicine or latex.  All medicines you are taking, including vitamins, herbs, eye drops, creams, and over-the-counter medicines. This is especially important if you take any blood-thinning medicine. Bring a list of all of your medicines to your appointment.  Any problems you or family members have had with anesthetic medicines.  Any blood disorders you have.  Any surgeries you have had.  Your medical history and any medical conditions you have.  Whether you are pregnant or may be pregnant.  Recent vaginal infections you have had.  Recent menstrual periods, bleeding problems you have had, and what form of birth control (contraception) you use. What are the risks? Generally, this is a safe procedure. However, problems may occur, including:  Infection.  Heavy vaginal bleeding.  Allergic reactions to medicines.  Damage to the cervix or other structures or organs.  Development of scar tissue (adhesions) inside the uterus, which can cause abnormal amounts of menstrual bleeding. This may make it harder to get pregnant in the future.  A hole (perforation) or puncture in the uterine wall. This is rare.  What happens before the procedure? Staying  hydrated Follow instructions from your health care provider about hydration, which may include:  Up to 2 hours before the procedure - you may continue to drink clear liquids, such as water, clear fruit juice, black coffee, and plain tea.  Eating and drinking restrictions Follow instructions from your health care provider about eating and drinking, which may include:  8 hours before the procedure - stop eating heavy meals or foods such as meat, fried foods, or fatty foods.  6 hours before the procedure - stop eating light meals or foods, such as toast or cereal.  6 hours before the procedure - stop drinking milk or drinks that contain milk.  2 hours before the procedure - stop drinking clear liquids. If your health care provider told you to take your medicine(s) on the day of your procedure, take them with only a sip of water.  Medicines  Ask your health care provider about: ? Changing or stopping your regular medicines. This is especially important if you are taking diabetes medicines or blood thinners. ? Taking medicines such as aspirin and ibuprofen. These medicines can thin your blood. Do not take these medicines before your procedure if your health care provider instructs you not to.  You may be given antibiotic medicine to help prevent infection. General instructions  For 24 hours before your procedure, do not: ? Douche. ? Use tampons. ? Use medicines, creams, or suppositories in the vagina. ? Have sexual intercourse.  You may be given a pregnancy test on the day of the procedure.  Plan to have someone take you home from the hospital or clinic.  You may have a blood or urine sample taken.  If you will be going home right after the procedure, plan to have someone with you for 24 hours. What happens during the procedure?  To reduce your risk of infection: ? Your health care team will wash or sanitize their hands. ? Your skin will be washed with soap.  An IV tube will be  inserted into one of your veins.  You will be given one of the following: ? A medicine that numbs the area in and around the cervix (local anesthetic). ? A medicine to make you fall asleep (general anesthetic).  You will lie down on your back, with your feet in foot rests (stirrups).  The size and position of your uterus will be checked.  A lubricated instrument (speculum or Sims retractor) will be inserted into the back side of your vagina. The speculum will be used to hold apart the walls of your vagina so your health care provider can see your cervix.  A tool (tenaculum) will be attached to the lip of the cervix to stabilize it.  Your cervix will be softened and dilated. This may be done by: ? Taking a medicine. ? Having tapered dilators or thin rods (laminaria) or gradual widening instruments (tapered dilators) inserted into your cervix.  A small, sharp, curved instrument (curette) will be used to scrape a small amount of tissue or cells from the endometrium or cervical canal. In some cases, gentle suction is applied with  the curette. The curette will then be removed. The cells will be taken to a lab for testing. The procedure may vary among health care providers and hospitals. What happens after the procedure?  You may have mild cramping, backache, pain, and light bleeding or spotting. You may pass small blood clots from your vagina.  You may have to wear compression stockings. These stockings help to prevent blood clots and reduce swelling in your legs.  Your blood pressure, heart rate, breathing rate, and blood oxygen level will be monitored until the medicines you were given have worn off. Summary  Dilation and curettage (D&C) involves stretching (dilation) the cervix and scraping (curettage) the inside lining of the uterus (endometrium).  After the procedure, you may have mild cramping, backache, pain, and light bleeding or spotting. You may pass small blood clots from your  vagina.  Plan to have someone take you home from the hospital or clinic. This information is not intended to replace advice given to you by your health care provider. Make sure you discuss any questions you have with your health care provider. Document Released: 10/17/2005 Document Revised: 07/03/2016 Document Reviewed: 07/03/2016 Elsevier Interactive Patient Education  2018 [employer].  Dilation and Curettage or Vacuum Curettage, Care After These instructions give you information about caring for yourself after your procedure. Your doctor may also give you more specific instructions. Call your doctor if you have any problems or questions after your procedure. Follow these instructions at home: Activity  Do not drive or use heavy machinery while taking prescription pain medicine.  For 24 hours after your procedure, avoid driving.  Take short walks often, followed by rest periods. Ask your doctor what activities are safe for you. After one or two days, you may be able to return to your normal activities.  Do not lift anything that is heavier than 10 lb (4.5 kg) until your doctor approves.  For at least 2 weeks, or as long as told by your doctor: ? Do not douche. ? Do not use tampons. ? Do not have sex. General instructions  Take over-the-counter and prescription medicines only as told by your doctor. This is very important if you take blood thinning medicine.  Do not take baths, swim, or use a hot tub until your doctor approves. Take showers instead of baths.  Wear compression stockings as told by your doctor.  It is up to you to get the results of your procedure. Ask your doctor when your results will be ready.  Keep all follow-up visits as told by your doctor. This is important. Contact a doctor if:  You have very bad cramps that get worse or do not get better with medicine.  You have very bad pain in your belly (abdomen).  You cannot drink fluids without throwing up  (vomiting).  You get pain in a different part of the area between your belly and thighs (pelvis).  You have bad-smelling discharge from your vagina.  You have a rash. Get help right away if:  You are bleeding a lot from your vagina. A lot of bleeding means soaking more than one sanitary pad in an hour, for 2 hours in a row.  You have clumps of blood (blood clots) coming from your vagina.  You have a fever or chills.  Your belly feels very tender or hard.  You have chest pain.  You have trouble breathing.  You cough up blood.  You feel dizzy.  You feel light-headed.  You pass  out (faint).  You have pain in your neck or shoulder area. Summary  Take short walks often, followed by rest periods. Ask your doctor what activities are safe for you. After one or two days, you may be able to return to your normal activities.  Do not lift anything that is heavier than 10 lb (4.5 kg) until your doctor approves.  Do not take baths, swim, or use a hot tub until your doctor approves. Take showers instead of baths.  Contact your doctor if you have any symptoms of infection, like bad-smelling discharge from your vagina. This information is not intended to replace advice given to you by your health care provider. Make sure you discuss any questions you have with your health care provider. Document Released: 07/26/2008 Document Revised: 07/04/2016 Document Reviewed: 07/04/2016 Elsevier Interactive Patient Education  2017 [employer]  Endometrial Ablation Endometrial ablation is a procedure that destroys the thin inner layer of the lining of the uterus (endometrium). This procedure may be done:  To stop heavy periods.  To stop bleeding that is causing anemia.  To control irregular bleeding.  To treat bleeding caused by small tumors (fibroids) in the endometrium.  This procedure is often an alternative to major surgery, such as removal of the uterus and cervix (hysterectomy). As a  result of this procedure:  You may not be able to have children. However, if you are premenopausal (you have not gone through menopause): ? You may still have a small chance of getting pregnant. ? You will need to use a reliable method of birth control after the procedure to prevent pregnancy.  You may stop having a menstrual period, or you may have only a small amount of bleeding during your period. Menstruation may return several years after the procedure.  Tell a health care provider about:  Any allergies you have.  All medicines you are taking, including vitamins, herbs, eye drops, creams, and over-the-counter medicines.  Any problems you or family members have had with the use of anesthetic medicines.  Any blood disorders you have.  Any surgeries you have had.  Any medical conditions you have. What are the risks? Generally, this is a safe procedure. However, problems may occur, including:  A hole (perforation) in the uterus or bowel.  Infection of the uterus, bladder, or vagina.  Bleeding.  Damage to other structures or organs.  An air bubble in the lung (air embolus).  Problems with pregnancy after the procedure.  Failure of the procedure.  Decreased ability to diagnose cancer in the endometrium.  What happens before the procedure?  You will have tests of your endometrium to make sure there are no pre-cancerous cells or cancer cells present.  You may have an ultrasound of the uterus.  You may be given medicines to thin the endometrium.  Ask your health care provider about: ? Changing or stopping your regular medicines. This is especially important if you take diabetes medicines or blood thinners. ? Taking medicines such as aspirin and ibuprofen. These medicines can thin your blood. Do not take these medicines before your procedure if your doctor tells you not to.  Plan to have someone take you home from the hospital or clinic. What happens during the  procedure?  You will lie on an exam table with your feet and legs supported as in a pelvic exam.  To lower your risk of infection: ? Your health care team will wash or sanitize their hands and put on germ-free (sterile) gloves. ?  Your genital area will be washed with soap.  An IV tube will be inserted into one of your veins.  You will be given a medicine to help you relax (sedative).  A surgical instrument with a light and camera (resectoscope) will be inserted into your vagina and moved into your uterus. This allows your surgeon to see inside your uterus.  Endometrial tissue will be removed using one of the following methods: ? Radiofrequency. This method uses a radiofrequency-alternating electric current to remove the endometrium. ? Cryotherapy. This method uses extreme cold to freeze the endometrium. ? Heated-free liquid. This method uses a heated saltwater (saline) solution to remove the endometrium. ? Microwave. This method uses high-energy microwaves to heat up the endometrium and remove it. ? Thermal balloon. This method involves inserting a catheter with a balloon tip into the uterus. The balloon tip is filled with heated fluid to remove the endometrium. The procedure may vary among health care providers and hospitals. What happens after the procedure?  Your blood pressure, heart rate, breathing rate, and blood oxygen level will be monitored until the medicines you were given have worn off.  As tissue healing occurs, you may notice vaginal bleeding for 4-6 weeks after the procedure. You may also experience: ? Cramps. ? Thin, watery vaginal discharge that is light pink or brown in color. ? A need to urinate more frequently than usual. ? Nausea.  Do not drive for 24 hours if you were given a sedative.  Do not have sex or insert anything into your vagina until your health care provider approves. Summary  Endometrial ablation is done to treat the many causes of heavy menstrual  bleeding.  The procedure may be done only after medications have been tried to control the bleeding.  Plan to have someone take you home from the hospital or clinic. This information is not intended to replace advice given to you by your health care provider. Make sure you discuss any questions you have with your health care provider. Document Released: 08/26/2004 Document Revised: 11/03/2016 Document Reviewed: 11/03/2016 Elsevier Interactive Patient Education  2017 [employer] Inc. Hysteroscopy Hysteroscopy is a procedure used for looking inside the womb (uterus). It may be done for various reasons, including:  To evaluate abnormal bleeding, fibroid (benign, noncancerous) tumors, polyps, scar tissue (adhesions), and possibly cancer of the uterus.  To look for lumps (tumors) and other uterine growths.  To look for causes of why a woman cannot get pregnant (infertility), causes of recurrent loss of pregnancy (miscarriages), or a lost intrauterine device (IUD).  To perform a sterilization by blocking the fallopian tubes from inside the uterus.  In this procedure, a thin, flexible tube with a tiny light and camera on the end of it (hysteroscope) is used to look inside the uterus. A hysteroscopy should be done right after a menstrual period to be sure you are not pregnant. LET [facility] CARE PROVIDER KNOW ABOUT:  Any allergies you have.  All medicines you are taking, including vitamins, herbs, eye drops, creams, and over-the-counter medicines.  Previous problems you or members of your family have had with the use of anesthetics.  Any blood disorders you have.  Previous surgeries you have had.  Medical conditions you have. RISKS AND COMPLICATIONS Generally, this is a safe procedure. However, as with any procedure, complications can occur. Possible complications include:  Putting a hole in the uterus.  Excessive bleeding.  Infection.  Damage to the cervix.  Injury to other  organs.  Allergic  reaction to medicines.  Too much fluid used in the uterus for the procedure.  BEFORE THE PROCEDURE  Ask your health care provider about changing or stopping any regular medicines.  Do not take aspirin or blood thinners for 1 week before the procedure, or as directed by your health care provider. These can cause bleeding.  If you smoke, do not smoke for 2 weeks before the procedure.  In some cases, a medicine is placed in the cervix the day before the procedure. This medicine makes the cervix have a larger opening (dilate). This makes it easier for the instrument to be inserted into the uterus during the procedure.  Do not eat or drink anything for at least 8 hours before the surgery.  Arrange for someone to take you home after the procedure. PROCEDURE  You may be given a medicine to relax you (sedative). You may also be given one of the following: ? A medicine that numbs the area around the cervix (local anesthetic). ? A medicine that makes you sleep through the procedure (general anesthetic).  The hysteroscope is inserted through the vagina into the uterus. The camera on the hysteroscope sends a picture to a TV screen. This gives the surgeon a good view inside the uterus.  During the procedure, air or a liquid is put into the uterus, which allows the surgeon to see better.  Sometimes, tissue is gently scraped from inside the uterus. These tissue samples are sent to a lab for testing. What to expect after the procedure  If you had a general anesthetic, you may be groggy for a couple hours after the procedure.  If you had a local anesthetic, you will be able to go home as soon as you are stable and feel ready.  You may have some cramping. This normally lasts for a couple days.  You may have bleeding, which varies from light spotting for a few days to menstrual-like bleeding for 3-7 days. This is normal.  If your test results are not back during the visit, make  an appointment with your health care provider to find out the results. This information is not intended to replace advice given to you by your health care provider. Make sure you discuss any questions you have with your health care provider. Document Released: 01/23/2001 Document Revised: 03/24/2016 Document Reviewed: 05/16/2013 Elsevier Interactive Patient Education  2017 [employer] Hysteroscopy, Care After Refer to this sheet in the next few weeks. These instructions provide you with information on caring for yourself after your procedure. Your health care provider may also give you more specific instructions. Your treatment has been planned according to current medical practices, but problems sometimes occur. Call your health care provider if you have any problems or questions after your procedure. What can I expect after the procedure? After your procedure, it is typical to have the following:  You may have some cramping. This normally lasts for a couple days.  You may have bleeding. This can vary from light spotting for a few days to menstrual-like bleeding for 3-7 days.  Follow these instructions at home:  Rest for the first 1-2 days after the procedure.  Only take over-the-counter or prescription medicines as directed by your health care provider. Do not take aspirin. It can increase the chances of bleeding.  Take showers instead of baths for 2 weeks or as directed by your health care provider.  Do not drive for 24 hours or as directed.  Do not drink alcohol while  taking pain medicine.  Do not use tampons, douche, or have sexual intercourse for 2 weeks or until your health care provider says it is okay.  Take your temperature twice a day for 4-5 days. Write it down each time.  Follow your health care provider's advice about diet, exercise, and lifting.  If you develop constipation, you may: ? Take a mild laxative if your health care provider approves. ? Add bran foods to  your diet. ? Drink enough fluids to keep your urine clear or pale yellow.  Try to have someone with you or available to you for the first 24-48 hours, especially if you were given a general anesthetic.  Follow up with your health care provider as directed. Contact a health care provider if:  You feel dizzy or lightheaded.  You feel sick to your stomach (nauseous).  You have abnormal vaginal discharge.  You have a rash.  You have pain that is not controlled with medicine. Get help right away if:  You have bleeding that is heavier than a normal menstrual period.  You have a fever.  You have increasing cramps or pain, not controlled with medicine.  You have new belly (abdominal) pain.  You pass out.  You have pain in the tops of your shoulders (shoulder strap areas).  You have shortness of breath. This information is not intended to replace advice given to you by your health care provider. Make sure you discuss any questions you have with your health care provider. Document Released: 08/07/2013 Document Revised: 03/24/2016 Document Reviewed: 05/16/2013 Elsevier Interactive Patient Education  2017 [employer]  Endometrial Ablation Endometrial ablation is a procedure that destroys the thin inner layer of the lining of the uterus (endometrium). This procedure may be done:  To stop heavy periods.  To stop bleeding that is causing anemia.  To control irregular bleeding.  To treat bleeding caused by small tumors (fibroids) in the endometrium.  This procedure is often an alternative to major surgery, such as removal of the uterus and cervix (hysterectomy). As a result of this procedure:  You may not be able to have children. However, if you are premenopausal (you have not gone through menopause): ? You may still have a small chance of getting pregnant. ? You will need to use a reliable method of birth control after the procedure to prevent pregnancy.  You may stop having  a menstrual period, or you may have only a small amount of bleeding during your period. Menstruation may return several years after the procedure.  Tell a health care provider about:  Any allergies you have.  All medicines you are taking, including vitamins, herbs, eye drops, creams, and over-the-counter medicines.  Any problems you or family members have had with the use of anesthetic medicines.  Any blood disorders you have.  Any surgeries you have had.  Any medical conditions you have. What are the risks? Generally, this is a safe procedure. However, problems may occur, including:  A hole (perforation) in the uterus or bowel.  Infection of the uterus, bladder, or vagina.  Bleeding.  Damage to other structures or organs.  An air bubble in the lung (air embolus).  Problems with pregnancy after the procedure.  Failure of the procedure.  Decreased ability to diagnose cancer in the endometrium.  What happens before the procedure?  You will have tests of your endometrium to make sure there are no pre-cancerous cells or cancer cells present.  You may have an ultrasound of the  uterus.  You may be given medicines to thin the endometrium.  Ask your health care provider about: ? Changing or stopping your regular medicines. This is especially important if you take diabetes medicines or blood thinners. ? Taking medicines such as aspirin and ibuprofen. These medicines can thin your blood. Do not take these medicines before your procedure if your doctor tells you not to.  Plan to have someone take you home from the hospital or clinic. What happens during the procedure?  You will lie on an exam table with your feet and legs supported as in a pelvic exam.  To lower your risk of infection: ? Your health care team will wash or sanitize their hands and put on germ-free (sterile) gloves. ? Your genital area will be washed with soap.  An IV tube will be inserted into one of your  veins.  You will be given a medicine to help you relax (sedative).  A surgical instrument with a light and camera (resectoscope) will be inserted into your vagina and moved into your uterus. This allows your surgeon to see inside your uterus.  Endometrial tissue will be removed using one of the following methods: ? Radiofrequency. This method uses a radiofrequency-alternating electric current to remove the endometrium. ? Cryotherapy. This method uses extreme cold to freeze the endometrium. ? Heated-free liquid. This method uses a heated saltwater (saline) solution to remove the endometrium. ? Microwave. This method uses high-energy microwaves to heat up the endometrium and remove it. ? Thermal balloon. This method involves inserting a catheter with a balloon tip into the uterus. The balloon tip is filled with heated fluid to remove the endometrium. The procedure may vary among health care providers and hospitals. What happens after the procedure?  Your blood pressure, heart rate, breathing rate, and blood oxygen level will be monitored until the medicines you were given have worn off.  As tissue healing occurs, you may notice vaginal bleeding for 4-6 weeks after the procedure. You may also experience: ? Cramps. ? Thin, watery vaginal discharge that is light pink or brown in color. ? A need to urinate more frequently than usual. ? Nausea.  Do not drive for 24 hours if you were given a sedative.  Do not have sex or insert anything into your vagina until your health care provider approves. Summary  Endometrial ablation is done to treat the many causes of heavy menstrual bleeding.  The procedure may be done only after medications have been tried to control the bleeding.  Plan to have someone take you home from the hospital or clinic. This information is not intended to replace advice given to you by your health care provider. Make sure you discuss any questions you have with your health  care provider. Document Released: 08/26/2004 Document Revised: 11/03/2016 Document Reviewed: 11/03/2016 Elsevier Interactive Patient Education  2017 [employer]  General Anesthesia, Adult General anesthesia is the use of medicines to make a person "go to sleep" (be unconscious) for a medical procedure. General anesthesia is often recommended when a procedure:  Is long.  Requires you to be still or in an unusual position.  Is major and can cause you to lose blood.  Is impossible to do without general anesthesia.  The medicines used for general anesthesia are called general anesthetics. In addition to making you sleep, the medicines:  Prevent pain.  Control your blood pressure.  Relax your muscles.  Tell a health care provider about:  Any allergies you have.  All  medicines you are taking, including vitamins, herbs, eye drops, creams, and over-the-counter medicines.  Any problems you or family members have had with anesthetic medicines.  Types of anesthetics you have had in the past.  Any bleeding disorders you have.  Any surgeries you have had.  Any medical conditions you have.  Any history of heart or lung conditions, such as heart failure, sleep apnea, or chronic obstructive pulmonary disease (COPD).  Whether you are pregnant or may be pregnant.  Whether you use tobacco, alcohol, marijuana, or street drugs.  Any history of Financial planner.  Any history of depression or anxiety. What are the risks? Generally, this is a safe procedure. However, problems may occur, including:  Allergic reaction to anesthetics.  Lung and heart problems.  Inhaling food or liquids from your stomach into your lungs (aspiration).  Injury to nerves.  Waking up during your procedure and being unable to move (rare).  Extreme agitation or a state of mental confusion (delirium) when you wake up from the anesthetic.  Air in the bloodstream, which can lead to stroke.  These  problems are more likely to develop if you are having a major surgery or if you have an advanced medical condition. You can prevent some of these complications by answering all of your health care provider's questions thoroughly and by following all pre-procedure instructions. General anesthesia can cause side effects, including:  Nausea or vomiting  A sore throat from the breathing tube.  Feeling cold or shivery.  Feeling tired, washed out, or achy.  Sleepiness or drowsiness.  Confusion or agitation.  What happens before the procedure? Staying hydrated Follow instructions from your health care provider about hydration, which may include:  Up to 2 hours before the procedure - you may continue to drink clear liquids, such as water, clear fruit juice, black coffee, and plain tea.  Eating and drinking restrictions Follow instructions from your health care provider about eating and drinking, which may include:  8 hours before the procedure - stop eating heavy meals or foods such as meat, fried foods, or fatty foods.  6 hours before the procedure - stop eating light meals or foods, such as toast or cereal.  6 hours before the procedure - stop drinking milk or drinks that contain milk.  2 hours before the procedure - stop drinking clear liquids.  Medicines  Ask your health care provider about: ? Changing or stopping your regular medicines. This is especially important if you are taking diabetes medicines or blood thinners. ? Taking medicines such as aspirin and ibuprofen. These medicines can thin your blood. Do not take these medicines before your procedure if your health care provider instructs you not to. ? Taking new dietary supplements or medicines. Do not take these during the week before your procedure unless your health care provider approves them.  If you are told to take a medicine or to continue taking a medicine on the day of the procedure, take the medicine with sips of  water. General instructions   Ask if you will be going home the same day, the following day, or after a longer hospital stay. ? Plan to have someone take you home. ? Plan to have someone stay with you for the first 24 hours after you leave the hospital or clinic.  For 3-6 weeks before the procedure, try not to use any tobacco products, such as cigarettes, chewing tobacco, and e-cigarettes.  You may brush your teeth on the morning of the procedure, but  make sure to spit out the toothpaste. What happens during the procedure?  You will be given anesthetics through a mask and through an IV tube in one of your veins.  You may receive medicine to help you relax (sedative).  As soon as you are asleep, a breathing tube may be used to help you breathe.  An anesthesia specialist will stay with you throughout the procedure. He or she will help keep you comfortable and safe by continuing to give you medicines and adjusting the amount of medicine that you get. He or she will also watch your blood pressure, pulse, and oxygen levels to make sure that the anesthetics do not cause any problems.  If a breathing tube was used to help you breathe, it will be removed before you wake up. The procedure may vary among health care providers and hospitals. What happens after the procedure?  You will wake up, often slowly, after the procedure is complete, usually in a recovery area.  Your blood pressure, heart rate, breathing rate, and blood oxygen level will be monitored until the medicines you were given have worn off.  You may be given medicine to help you calm down if you feel anxious or agitated.  If you will be going home the same day, your health care provider may check to make sure you can stand, drink, and urinate.  Your health care providers will treat your pain and side effects before you go home.  Do not drive for 24 hours if you received a sedative.  You may: ? Feel nauseous and  vomit. ? Have a sore throat. ? Have mental slowness. ? Feel cold or shivery. ? Feel sleepy. ? Feel tired. ? Feel sore or achy, even in parts of your body where you did not have surgery. This information is not intended to replace advice given to you by your health care provider. Make sure you discuss any questions you have with your health care provider. Document Released: 01/24/2008 Document Revised: 03/29/2016 Document Reviewed: 10/01/2015 Elsevier Interactive Patient Education  2018 [employer]. General Anesthesia, Adult, Care After These instructions provide you with information about caring for yourself after your procedure. Your health care provider may also give you more specific instructions. Your treatment has been planned according to current medical practices, but problems sometimes occur. Call your health care provider if you have any problems or questions after your procedure. What can I expect after the procedure? After the procedure, it is common to have:  Vomiting.  A sore throat.  Mental slowness.  It is common to feel:  Nauseous.  Cold or shivery.  Sleepy.  Tired.  Sore or achy, even in parts of your body where you did not have surgery.  Follow these instructions at home: For at least 24 hours after the procedure:  Do not: ? Participate in activities where you could fall or become injured. ? Drive. ? Use heavy machinery. ? Drink alcohol. ? Take sleeping pills or medicines that cause drowsiness. ? Make important decisions or sign legal documents. ? Take care of children on your own.  Rest. Eating and drinking  If you vomit, drink water, juice, or soup when you can drink without vomiting.  Drink enough fluid to keep your urine clear or pale yellow.  Make sure you have little or no nausea before eating solid foods.  Follow the diet recommended by your health care provider. General instructions  Have a responsible adult stay with you until you  are  awake and alert.  Return to your normal activities as told by your health care provider. Ask your health care provider what activities are safe for you.  Take over-the-counter and prescription medicines only as told by your health care provider.  If you smoke, do not smoke without supervision.  Keep all follow-up visits as told by your health care provider. This is important. Contact a health care provider if:  You continue to have nausea or vomiting at home, and medicines are not helpful.  You cannot drink fluids or start eating again.  You cannot urinate after 8-12 hours.  You develop a skin rash.  You have fever.  You have increasing redness at the site of your procedure. Get help right away if:  You have difficulty breathing.  You have chest pain.  You have unexpected bleeding.  You feel that you are having a life-threatening or urgent problem. This information is not intended to replace advice given to you by your health care provider. Make sure you discuss any questions you have with your health care provider. Document Released: 01/23/2001 Document Revised: 03/21/2016 Document Reviewed: 10/01/2015 Elsevier Interactive Patient Education  [employer].

## 2017-04-03 ENCOUNTER — Encounter (HOSPITAL_COMMUNITY): Payer: Self-pay

## 2017-04-03 ENCOUNTER — Encounter (HOSPITAL_COMMUNITY)
Admission: RE | Admit: 2017-04-03 | Discharge: 2017-04-03 | Disposition: A | Discharge status: Home / Self Care | Payer: BLUE CROSS/BLUE SHIELD | Source: Ambulatory Visit | Attending: Obstetrics & Gynecology | Admitting: Obstetrics & Gynecology

## 2017-04-03 DIAGNOSIS — N946 Dysmenorrhea, unspecified: Secondary | ICD-10-CM | POA: Diagnosis present

## 2017-04-03 DIAGNOSIS — K219 Gastro-esophageal reflux disease without esophagitis: Secondary | ICD-10-CM | POA: Diagnosis not present

## 2017-04-03 DIAGNOSIS — I10 Essential (primary) hypertension: Secondary | ICD-10-CM | POA: Diagnosis not present

## 2017-04-03 DIAGNOSIS — D649 Anemia, unspecified: Secondary | ICD-10-CM | POA: Diagnosis not present

## 2017-04-03 DIAGNOSIS — K2970 Gastritis, unspecified, without bleeding: Secondary | ICD-10-CM | POA: Diagnosis not present

## 2017-04-03 DIAGNOSIS — Z79899 Other long term (current) drug therapy: Secondary | ICD-10-CM | POA: Diagnosis not present

## 2017-04-03 HISTORY — DX: Anemia, unspecified: D64.9

## 2017-04-03 LAB — URINALYSIS, ROUTINE W REFLEX MICROSCOPIC
Bilirubin Urine: NEGATIVE
Glucose, UA: NEGATIVE mg/dL
Ketones, ur: NEGATIVE mg/dL
NITRITE: NEGATIVE
Protein, ur: NEGATIVE mg/dL
SPECIFIC GRAVITY, URINE: 1.019 (ref 1.005–1.030)
pH: 5 (ref 5.0–8.0)

## 2017-04-03 LAB — COMPREHENSIVE METABOLIC PANEL
ALBUMIN: 4 g/dL (ref 3.5–5.0)
ALT: 12 U/L — ABNORMAL LOW (ref 14–54)
ANION GAP: 7 (ref 5–15)
AST: 19 U/L (ref 15–41)
Alkaline Phosphatase: 47 U/L (ref 38–126)
BUN: 9 mg/dL (ref 6–20)
CHLORIDE: 109 mmol/L (ref 101–111)
CO2: 21 mmol/L — ABNORMAL LOW (ref 22–32)
Calcium: 9.1 mg/dL (ref 8.9–10.3)
Creatinine, Ser: 0.96 mg/dL (ref 0.44–1.00)
GFR calc Af Amer: >60 mL/min (ref >60)
GFR calc non Af Amer: >60 mL/min (ref >60)
GLUCOSE: 93 mg/dL (ref 65–99)
Potassium: 3.6 mmol/L (ref 3.5–5.1)
SODIUM: 137 mmol/L (ref 135–145)
TOTAL PROTEIN: 7.7 g/dL (ref 6.5–8.1)
Total Bilirubin: 0.4 mg/dL (ref 0.3–1.2)

## 2017-04-03 LAB — CBC
HCT: 35.9 % — ABNORMAL LOW (ref 36.0–46.0)
Hemoglobin: 11.7 g/dL — ABNORMAL LOW (ref 12.0–15.0)
MCH: 26 pg (ref 26.0–34.0)
MCHC: 32.6 g/dL (ref 30.0–36.0)
MCV: 79.8 fL (ref 78.0–100.0)
Platelets: 348 10*3/uL (ref 150–400)
RBC: 4.5 MIL/uL (ref 3.87–5.11)
RDW: 14.9 % (ref 11.5–15.5)
WBC: 7.5 10*3/uL (ref 4.0–10.5)

## 2017-04-03 LAB — RAPID HIV SCREEN (HIV 1/2 AB+AG)
HIV 1/2 ANTIBODIES: NONREACTIVE
HIV-1 P24 Antigen - HIV24: NONREACTIVE

## 2017-04-03 LAB — HCG, QUANTITATIVE, PREGNANCY: hCG, Beta Chain, Quant, S: 1 m[IU]/mL (ref <5)

## 2017-04-05 ENCOUNTER — Encounter (HOSPITAL_COMMUNITY): Payer: Self-pay | Admitting: *Deleted

## 2017-04-05 ENCOUNTER — Ambulatory Visit (HOSPITAL_COMMUNITY): Payer: BLUE CROSS/BLUE SHIELD | Admitting: Anesthesiology

## 2017-04-05 ENCOUNTER — Ambulatory Visit (HOSPITAL_COMMUNITY)
Admission: RE | Admit: 2017-04-05 | Discharge: 2017-04-05 | Disposition: A | Discharge status: Home / Self Care | Payer: BLUE CROSS/BLUE SHIELD | Source: Ambulatory Visit | Attending: Obstetrics & Gynecology | Admitting: Obstetrics & Gynecology

## 2017-04-05 ENCOUNTER — Encounter (HOSPITAL_COMMUNITY)
Admission: RE | Disposition: A | Discharge status: Home / Self Care | Payer: Self-pay | Source: Ambulatory Visit | Attending: Obstetrics & Gynecology

## 2017-04-05 ENCOUNTER — Encounter: Payer: Self-pay | Admitting: Obstetrics and Gynecology

## 2017-04-05 DIAGNOSIS — N946 Dysmenorrhea, unspecified: Secondary | ICD-10-CM | POA: Diagnosis not present

## 2017-04-05 DIAGNOSIS — K219 Gastro-esophageal reflux disease without esophagitis: Secondary | ICD-10-CM | POA: Insufficient documentation

## 2017-04-05 DIAGNOSIS — K2970 Gastritis, unspecified, without bleeding: Secondary | ICD-10-CM | POA: Insufficient documentation

## 2017-04-05 DIAGNOSIS — I10 Essential (primary) hypertension: Secondary | ICD-10-CM | POA: Insufficient documentation

## 2017-04-05 DIAGNOSIS — D649 Anemia, unspecified: Secondary | ICD-10-CM | POA: Insufficient documentation

## 2017-04-05 DIAGNOSIS — Z79899 Other long term (current) drug therapy: Secondary | ICD-10-CM | POA: Insufficient documentation

## 2017-04-05 HISTORY — PX: DILITATION & CURRETTAGE/HYSTROSCOPY WITH NOVASURE ABLATION: SHX5568

## 2017-04-05 SURGERY — DILATATION & CURETTAGE/HYSTEROSCOPY WITH NOVASURE ABLATION
Anesthesia: General

## 2017-04-05 MED ORDER — GLYCOPYRROLATE 0.2 MG/ML IJ SOLN
0.2000 mg | Freq: Once | INTRAMUSCULAR | Status: DC | PRN
  Filled 2017-04-05: qty 1

## 2017-04-05 MED ORDER — ONDANSETRON 8 MG PO TBDP: 8.0000 mg | ORAL_TABLET | Freq: Three times a day (TID) | ORAL | 0 refills | Status: DC | PRN

## 2017-04-05 MED ORDER — LACTATED RINGERS IV SOLN
INTRAVENOUS | Status: DC
  Administered 2017-04-05: 10:00:00 via INTRAVENOUS

## 2017-04-05 MED ORDER — PROPOFOL 10 MG/ML IV BOLUS
INTRAVENOUS | Status: DC | PRN
  Administered 2017-04-05: 150 mg via INTRAVENOUS

## 2017-04-05 MED ORDER — PROPOFOL 10 MG/ML IV BOLUS
INTRAVENOUS | Status: AC
  Filled 2017-04-05: qty 20

## 2017-04-05 MED ORDER — HYDROCODONE-ACETAMINOPHEN 5-325 MG PO TABS: 1.0000 | ORAL_TABLET | Freq: Four times a day (QID) | ORAL | 0 refills | Status: DC | PRN

## 2017-04-05 MED ORDER — KETOROLAC TROMETHAMINE 10 MG PO TABS: 10.0000 mg | ORAL_TABLET | Freq: Three times a day (TID) | ORAL | 0 refills | Status: DC | PRN

## 2017-04-05 MED ORDER — FENTANYL CITRATE (PF) 100 MCG/2ML IJ SOLN: 25.0000 ug | INTRAMUSCULAR | Status: DC | PRN

## 2017-04-05 MED ORDER — FENTANYL CITRATE (PF) 250 MCG/5ML IJ SOLN
INTRAMUSCULAR | Status: AC
  Filled 2017-04-05: qty 5

## 2017-04-05 MED ORDER — GENTAMICIN SULFATE 40 MG/ML IJ SOLN
INTRAVENOUS | Status: AC
  Administered 2017-04-05: 100 mL via INTRAVENOUS
  Filled 2017-04-05: qty 10.75

## 2017-04-05 MED ORDER — FENTANYL CITRATE (PF) 100 MCG/2ML IJ SOLN
INTRAMUSCULAR | Status: DC | PRN
  Administered 2017-04-05: 30 ug via INTRAVENOUS
  Administered 2017-04-05: 20 ug via INTRAVENOUS

## 2017-04-05 MED ORDER — MIDAZOLAM HCL 2 MG/2ML IJ SOLN
1.0000 mg | INTRAMUSCULAR | Status: AC
Start: 2017-04-05 — End: 2017-04-05
  Administered 2017-04-05 (×2): 2 mg via INTRAVENOUS
  Filled 2017-04-05 (×2): qty 2

## 2017-04-05 MED ORDER — FENTANYL CITRATE (PF) 100 MCG/2ML IJ SOLN
25.0000 ug | Freq: Once | INTRAMUSCULAR | Status: AC
  Administered 2017-04-05: 25 ug via INTRAVENOUS
  Filled 2017-04-05: qty 2

## 2017-04-05 MED ORDER — KETOROLAC TROMETHAMINE 30 MG/ML IJ SOLN
30.0000 mg | Freq: Once | INTRAMUSCULAR | Status: AC
  Administered 2017-04-05: 30 mg via INTRAVENOUS
  Filled 2017-04-05: qty 1

## 2017-04-05 MED ORDER — ONDANSETRON HCL 4 MG/2ML IJ SOLN
4.0000 mg | Freq: Once | INTRAMUSCULAR | Status: DC
  Filled 2017-04-05: qty 2

## 2017-04-05 MED ORDER — LIDOCAINE HCL (CARDIAC) 10 MG/ML IV SOLN
INTRAVENOUS | Status: DC | PRN
  Administered 2017-04-05: 40 mg via INTRAVENOUS

## 2017-04-05 SURGICAL SUPPLY — 31 items
ABLATOR ENDOMETRIAL BIPOLAR (ABLATOR) ×3 IMPLANT
BAG HAMPER (MISCELLANEOUS) ×3 IMPLANT
CLOTH BEACON ORANGE TIMEOUT ST (SAFETY) ×3 IMPLANT
COVER LIGHT HANDLE STERIS (MISCELLANEOUS) ×6 IMPLANT
FORMALIN 10 PREFIL 120ML (MISCELLANEOUS) ×3 IMPLANT
GAUZE SPONGE 4X4 16PLY XRAY LF (GAUZE/BANDAGES/DRESSINGS) ×3 IMPLANT
GLOVE BIO SURGEON STRL SZ 6.5 (GLOVE) ×1 IMPLANT
GLOVE BIO SURGEONS STRL SZ 6.5 (GLOVE) ×1
GLOVE BIOGEL PI IND STRL 6.5 (GLOVE) IMPLANT
GLOVE BIOGEL PI IND STRL 7.0 (GLOVE) ×2 IMPLANT
GLOVE BIOGEL PI IND STRL 8 (GLOVE) ×1 IMPLANT
GLOVE BIOGEL PI INDICATOR 6.5 (GLOVE) ×2
GLOVE BIOGEL PI INDICATOR 7.0 (GLOVE) ×12
GLOVE BIOGEL PI INDICATOR 8 (GLOVE) ×2
GLOVE ECLIPSE 8.0 STRL XLNG CF (GLOVE) ×3 IMPLANT
GOWN STRL REUS W/TWL LRG LVL3 (GOWN DISPOSABLE) ×3 IMPLANT
GOWN STRL REUS W/TWL XL LVL3 (GOWN DISPOSABLE) ×3 IMPLANT
INST SET HYSTEROSCOPY (KITS) ×3 IMPLANT
IV NS 1000ML (IV SOLUTION) ×3
IV NS 1000ML BAXH (IV SOLUTION) ×1 IMPLANT
KIT ROOM TURNOVER AP CYSTO (KITS) ×3 IMPLANT
MANIFOLD NEPTUNE II (INSTRUMENTS) ×3 IMPLANT
NS IRRIG 1000ML POUR BTL (IV SOLUTION) ×3 IMPLANT
PACK BASIC III (CUSTOM PROCEDURE TRAY) ×3
PACK SRG BSC III STRL LF ECLPS (CUSTOM PROCEDURE TRAY) ×1 IMPLANT
PAD ARMBOARD 7.5X6 YLW CONV (MISCELLANEOUS) ×3 IMPLANT
PAD TELFA 3X4 1S STER (GAUZE/BANDAGES/DRESSINGS) ×3 IMPLANT
SET BASIN LINEN APH (SET/KITS/TRAYS/PACK) ×3 IMPLANT
SET IRRIG Y TYPE TUR BLADDER L (SET/KITS/TRAYS/PACK) ×3 IMPLANT
SHEET LAVH (DRAPES) ×3 IMPLANT
YANKAUER SUCT BULB TIP 10FT TU (MISCELLANEOUS) ×3 IMPLANT

## 2017-04-05 NOTE — Anesthesia Postprocedure Evaluation (Signed)
Anesthesia Post Note  Patient: Eunice BlaseJudy A Sipp  Procedure(s) Performed: Procedure(s) (LRB): DILATATION & CURETTAGE/HYSTEROSCOPY WITH NOVASURE ENDOMETRIAL ABLATION (N/A)  Patient location during evaluation: PACU Anesthesia Type: General Level of consciousness: awake, awake and alert, oriented and patient cooperative Pain management: pain level controlled Vital Signs Assessment: post-procedure vital signs reviewed and stable Respiratory status: spontaneous breathing, nonlabored ventilation, respiratory function stable and patient connected to face mask oxygen Cardiovascular status: stable Postop Assessment: no signs of nausea or vomiting Anesthetic complications: no     Last Vitals:  Vitals:   04/05/17 1102 04/05/17 1103  BP:  132/75  Pulse:    Resp: (!) 28 (!) 27  Temp:      Last Pain:  Vitals:   04/05/17 0918  TempSrc: Oral                 Tameia Rafferty L

## 2017-04-05 NOTE — Transfer of Care (Signed)
Immediate Anesthesia Transfer of Care Note  Patient: Eunice BlaseJudy A Jaimes  Procedure(s) Performed: Procedure(s): DILATATION & CURETTAGE/HYSTEROSCOPY WITH NOVASURE ENDOMETRIAL ABLATION (N/A)  Patient Location: PACU  Anesthesia Type:General  Level of Consciousness: awake, alert , oriented, drowsy and patient cooperative  Airway & Oxygen Therapy: Patient Spontanous Breathing and Patient connected to nasal cannula oxygen  Post-op Assessment: Report given to RN and Post -op Vital signs reviewed and stable  Post vital signs: Reviewed and stable  Last Vitals:  Vitals:   04/05/17 1102 04/05/17 1103  BP:  132/75  Pulse:    Resp: (!) 28 (!) 27  Temp:      Last Pain:  Vitals:   04/05/17 0918  TempSrc: Oral      Patients Stated Pain Goal: 7 (04/05/17 0918)  Complications: No apparent anesthesia complications

## 2017-04-05 NOTE — Anesthesia Preprocedure Evaluation (Signed)
Anesthesia Evaluation  Patient identified by MRN, date of birth, ID band Patient awake    Reviewed: Allergy & Precautions, NPO status , Patient's Chart, lab work & pertinent test results  Airway Mallampati: I  TM Distance: >3 FB Neck ROM: Full    Dental  (+) Teeth Intact   Pulmonary neg pulmonary ROS,    breath sounds clear to auscultation       Cardiovascular hypertension, Pt. on medications  Rhythm:Regular Rate:Normal     Neuro/Psych negative neurological ROS  negative psych ROS   GI/Hepatic GERD  Controlled and Medicated,  Endo/Other    Renal/GU      Musculoskeletal   Abdominal   Peds  Hematology  (+) anemia ,   Anesthesia Other Findings   Reproductive/Obstetrics                             Anesthesia Physical Anesthesia Plan  ASA: II  Anesthesia Plan: General   Post-op Pain Management:    Induction: Intravenous  PONV Risk Score and Plan:   Airway Management Planned: LMA  Additional Equipment:   Intra-op Plan:   Post-operative Plan: Extubation in OR  Informed Consent: I have reviewed the patients History and Physical, chart, labs and discussed the procedure including the risks, benefits and alternatives for the proposed anesthesia with the patient or authorized representative who has indicated his/her understanding and acceptance.     Plan Discussed with:   Anesthesia Plan Comments:         Anesthesia Quick Evaluation

## 2017-04-05 NOTE — H&P (Signed)
Marland Kitchen.Preoperative History and Physical  Eunice BlaseJudy A Ward is a 46 y.o. [phone] with Patient's last menstrual period was 03/11/2017. admitted for a hysteroscopy uterine curettage endometrial ablation for abnormal uterine bleeding increasingly worse on Depo provera. 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 Gastritis   . GERD (gastroesophageal reflux disease)   . Hypertension   . Status post dilation of esophageal narrowing   . Vertigo     PSH:          Past Surgical History:  Procedure Laterality Date  . ESOPHAGOGASTRODUODENOSCOPY N/A 09/02/2015   AVW:UJWJSLF:mild non-erosive gastritis/stricture at the gastroesophageal junction   . NONE TO DATE  08/13/15  . SAVORY DILATION  09/02/2015   Procedure: SAVORY DILATION;  Surgeon: West BaliSandi L Fields, MD;  Location: [facility];  Service: Endoscopy;;    POb/GynH:              OB History    Gravida Para Term Preterm AB Living   2 2   2   2    SAB TAB Ectopic Multiple Live Births           2      SH:       Social History  Substance Use Topics  . Smoking status: Never Smoker  . Smokeless tobacco: Never Used  . Alcohol use No    FH:         Family History  Problem Relation Age of Onset  . Throat cancer Father [id number]  . Hypertension Father   . Hypertension Mother   . Congestive Heart Failure Sister   . Colon cancer Neg Hx      Allergies:       Allergies  Allergen Reactions  . Benadryl [Diphenhydramine Hcl (Sleep)]     tachycardia  . Penicillins Hives    Has patient had a PCN reaction causing immediate rash, facial/tongue/throat swelling, SOB or lightheadedness with hypotension: pt  does not remember  Has patient had a PCN reaction causing severe rash involving mucus membranes or skin necrosis: No Has patient had a PCN reaction that required hospitalization No Has patient had a PCN reaction occurring within the last 10 years: No      Medications:       Current Outpatient Prescriptions:  .  amLODipine (NORVASC) 5 MG tablet, Take 5 mg by mouth daily., Disp: , Rfl:  .  fluticasone (FLONASE) 50 MCG/ACT nasal spray, Place 1 spray into both nostrils daily as needed for allergies., Disp: , Rfl:  .  meclizine (ANTIVERT) 25 MG tablet, Take 25 mg by mouth 3 (three) times daily as needed for dizziness., Disp: , Rfl:  .  medroxyPROGESTERone (DEPO-PROVERA) 150 MG/ML injection, Inject 150 mg into the muscle every 3 (three) months., Disp: , Rfl:  .  omeprazole (PRILOSEC) 20 MG capsule, Take 1 capsule (20 mg total) by mouth 2 (two) times daily before a meal., Disp: 60 capsule, Rfl: 2  Review of Systems:   Review of Systems  Constitutional: Negative for fever, chills, weight loss, malaise/fatigue and diaphoresis.  HENT: Negative for hearing loss, ear pain, nosebleeds, congestion, sore throat, neck pain,  tinnitus and ear discharge.   Eyes: Negative for blurred vision, double vision, photophobia, pain, discharge and redness.  Respiratory: Negative for cough, hemoptysis, sputum production, shortness of breath, wheezing and stridor.   Cardiovascular: Negative for chest pain, palpitations, orthopnea, claudication, leg swelling and PND.  Gastrointestinal: Positive for abdominal pain. Negative for heartburn, nausea, vomiting, diarrhea, constipation, blood in stool and melena.  Genitourinary: Negative for dysuria, urgency, frequency, hematuria and flank pain.  Musculoskeletal: Negative for myalgias, back pain, joint pain and falls.  Skin: Negative for itching and rash.  Neurological: Negative for dizziness, tingling, tremors, sensory change, speech change, focal weakness, seizures,  loss of consciousness, weakness and headaches.  Endo/Heme/Allergies: Negative for environmental allergies and polydipsia. Does not bruise/bleed easily.  Psychiatric/Behavioral: Negative for depression, suicidal ideas, hallucinations, memory loss and substance abuse. The patient is not nervous/anxious and does not have insomnia.      PHYSICAL EXAM:  Blood pressure (!) 142/90, pulse 77, height 5\' 4"  (1.626 m), weight 189 lb (85.7 kg), last menstrual period 03/11/2017.    Vitals reviewed. Constitutional: She is oriented to person, place, and time. She appears well-developed and well-nourished.  HENT:  Head: Normocephalic and atraumatic.  Right Ear: External ear normal.  Left Ear: External ear normal.  Nose: Nose normal.  Mouth/Throat: Oropharynx is clear and moist.  Eyes: Conjunctivae and EOM are normal. Pupils are equal, round, and reactive to light. Right eye exhibits no discharge. Left eye exhibits no discharge. No scleral icterus.  Neck: Normal range of motion. Neck supple. No tracheal deviation present. No thyromegaly present.  Cardiovascular: Normal rate, regular rhythm, normal heart sounds and intact distal pulses.  Exam reveals no gallop and no friction rub.   No murmur heard. Respiratory: Effort normal and breath sounds normal. No respiratory distress. She has no wheezes. She has no rales. She exhibits no tenderness.  GI: Soft. Bowel sounds are normal. She exhibits no distension and no mass. There is tenderness. There is no rebound and no guarding.  Genitourinary:       Vulva is normal without lesions Vagina is pink moist without discharge Cervix normal in appearance and pap is normal Uterus is normal size, contour, position, consistency, mobility, non-tender Adnexa is negative with normal sized ovaries by sonogram  Musculoskeletal: Normal range of motion. She exhibits no edema and no tenderness.  Neurological: She is alert and oriented to person, place, and time. She has  normal reflexes. She displays normal reflexes. No cranial nerve deficit. She exhibits normal muscle tone. Coordination normal.  Skin: Skin is warm and dry. No rash noted. No erythema. No pallor.  Psychiatric: She has a normal mood and affect. Her behavior is normal. Judgment and thought content normal.    Labs: Results for orders placed or performed during the hospital encounter of 04/03/17 (from the past 168 hour(s))  CBC   Collection Time: 04/03/17  1:53 PM  Result Value Ref Range   WBC 7.5 4.0 - 10.5 K/uL   RBC 4.50 3.87 - 5.11 MIL/uL   Hemoglobin 11.7 (L) 12.0 - 15.0 g/dL   HCT 40.9 (L) 81.1 - 91.4 %   MCV 79.8 78.0 - 100.0 fL   MCH 26.0 26.0 - 34.0 pg   MCHC 32.6 30.0 - 36.0 g/dL   RDW 78.2 95.6 - 21.3 %   Platelets 348 150 - 400 K/uL  Comprehensive metabolic panel   Collection Time: 04/03/17  1:53 PM  Result Value Ref Range   Sodium 137 135 - 145 mmol/L  Potassium 3.6 3.5 - 5.1 mmol/L   Chloride 109 101 - 111 mmol/L   CO2 21 (L) 22 - 32 mmol/L   Glucose, Bld 93 65 - 99 mg/dL   BUN 9 6 - 20 mg/dL   Creatinine, Ser 4.09 0.44 - 1.00 mg/dL   Calcium 9.1 8.9 - 81.1 mg/dL   Total Protein 7.7 6.5 - 8.1 g/dL   Albumin 4.0 3.5 - 5.0 g/dL   AST 19 15 - 41 U/L   ALT 12 (L) 14 - 54 U/L   Alkaline Phosphatase 47 38 - 126 U/L   Total Bilirubin 0.4 0.3 - 1.2 mg/dL   GFR calc non Af Amer >60 >60 mL/min   GFR calc Af Amer >60 >60 mL/min   Anion gap 7 5 - 15  hCG, quantitative, pregnancy   Collection Time: 04/03/17  1:53 PM  Result Value Ref Range   hCG, Beta Chain, Quant, S 1 <5 mIU/mL  Rapid HIV screen (HIV 1/2 Ab+Ag)   Collection Time: 04/03/17  1:53 PM  Result Value Ref Range   HIV-1 P24 Antigen - HIV24 NON REACTIVE NON REACTIVE   HIV 1/2 Antibodies NON REACTIVE NON REACTIVE   Interpretation (HIV Ag Ab)      A non reactive test result means that HIV 1 or HIV 2 antibodies and HIV 1 p24 antigen were not detected in the specimen.  Urinalysis, Routine w reflex microscopic    Collection Time: 04/03/17  1:53 PM  Result Value Ref Range   Color, Urine YELLOW YELLOW   APPearance HAZY (A) CLEAR   Specific Gravity, Urine 1.019 1.005 - 1.030   pH 5.0 5.0 - 8.0   Glucose, UA NEGATIVE NEGATIVE mg/dL   Hgb urine dipstick LARGE (A) NEGATIVE   Bilirubin Urine NEGATIVE NEGATIVE   Ketones, ur NEGATIVE NEGATIVE mg/dL   Protein, ur NEGATIVE NEGATIVE mg/dL   Nitrite NEGATIVE NEGATIVE   Leukocytes, UA SMALL (A) NEGATIVE   RBC / HPF 0-5 0 - 5 RBC/hpf   WBC, UA 0-5 0 - 5 WBC/hpf   Bacteria, UA RARE (A) NONE SEEN   Squamous Epithelial / LPF 6-30 (A) NONE SEEN   Mucous PRESENT    Hyaline Casts, UA PRESENT      EKG:    Orders placed or performed during the hospital encounter of 09/17/15  . ED EKG  . ED EKG  . EKG 12-Lead  . EKG 12-Lead  . EKG 12-Lead  . EKG 12-Lead    Imaging Studies:         Assessment: AUB  Dysmenorrhea Unresponsive to increasing megestrol supplementation     Patient Active Problem List   Diagnosis Date Noted  . Nausea with vomiting 09/14/2016  . Abdominal pain 09/14/2016  . Menorrhagia 05/30/2016  . Esophageal reflux   . Sensation of foreign body in esophagus 08/13/2015  . GERD (gastroesophageal reflux disease) 08/13/2015  . Palpitations 11/22/2012  . Stomach upset 11/22/2012    Plan: Hysteroscopy uterine curettage endometrial ablation using NovaSure  EURE,LUTHER H 03/24/2017 9:17 AM

## 2017-04-05 NOTE — Anesthesia Postprocedure Evaluation (Signed)
Anesthesia Post Note  Patient: Karla Ward  Procedure(s) Performed: Procedure(s) (LRB): DILATATION & CURETTAGE/HYSTEROSCOPY WITH NOVASURE ENDOMETRIAL ABLATION (N/A)  Patient location during evaluation: PACU Anesthesia Type: General Level of consciousness: awake and alert Pain management: satisfactory to patient Respiratory status: spontaneous breathing Cardiovascular status: stable Anesthetic complications: no     Last Vitals:  Vitals:   04/05/17 1300 04/05/17 1313  BP: 118/77 116/65  Pulse: 88 85  Resp: (!) 22 16  Temp:  36.6 C    Last Pain:  Vitals:   04/05/17 1313  TempSrc: Oral                 Job Holtsclaw

## 2017-04-05 NOTE — Discharge Instructions (Signed)
Endometrial Ablation Endometrial ablation is a procedure that destroys the thin inner layer of the lining of the uterus (endometrium). This procedure may be done:  To stop heavy periods.  To stop bleeding that is causing anemia.  To control irregular bleeding.  To treat bleeding caused by small tumors (fibroids) in the endometrium.  This procedure is often an alternative to major surgery, such as removal of the uterus and cervix (hysterectomy). As a result of this procedure:  You may not be able to have children. However, if you are premenopausal (you have not gone through menopause): ? You may still have a small chance of getting pregnant. ? You will need to use a reliable method of birth control after the procedure to prevent pregnancy.  You may stop having a menstrual period, or you may have only a small amount of bleeding during your period. Menstruation may return several years after the procedure.  Tell a health care provider about:  Any allergies you have.  All medicines you are taking, including vitamins, herbs, eye drops, creams, and over-the-counter medicines.  Any problems you or family members have had with the use of anesthetic medicines.  Any blood disorders you have.  Any surgeries you have had.  Any medical conditions you have. What are the risks? Generally, this is a safe procedure. However, problems may occur, including:  A hole (perforation) in the uterus or bowel.  Infection of the uterus, bladder, or vagina.  Bleeding.  Damage to other structures or organs.  An air bubble in the lung (air embolus).  Problems with pregnancy after the procedure.  Failure of the procedure.  Decreased ability to diagnose cancer in the endometrium.  What happens before the procedure?  You will have tests of your endometrium to make sure there are no pre-cancerous cells or cancer cells present.  You may have an ultrasound of the uterus.  You may be given  medicines to thin the endometrium.  Ask your health care provider about: ? Changing or stopping your regular medicines. This is especially important if you take diabetes medicines or blood thinners. ? Taking medicines such as aspirin and ibuprofen. These medicines can thin your blood. Do not take these medicines before your procedure if your doctor tells you not to.  Plan to have someone take you home from the hospital or clinic. What happens during the procedure?  You will lie on an exam table with your feet and legs supported as in a pelvic exam.  To lower your risk of infection: ? Your health care team will wash or sanitize their hands and put on germ-free (sterile) gloves. ? Your genital area will be washed with soap.  An IV tube will be inserted into one of your veins.  You will be given a medicine to help you relax (sedative).  A surgical instrument with a light and camera (resectoscope) will be inserted into your vagina and moved into your uterus. This allows your surgeon to see inside your uterus.  Endometrial tissue will be removed using one of the following methods: ? Radiofrequency. This method uses a radiofrequency-alternating electric current to remove the endometrium. ? Cryotherapy. This method uses extreme cold to freeze the endometrium. ? Heated-free liquid. This method uses a heated saltwater (saline) solution to remove the endometrium. ? Microwave. This method uses high-energy microwaves to heat up the endometrium and remove it. ? Thermal balloon. This method involves inserting a catheter with a balloon tip into the uterus. The balloon tip is   filled with heated fluid to remove the endometrium. The procedure may vary among health care providers and hospitals. What happens after the procedure?  Your blood pressure, heart rate, breathing rate, and blood oxygen level will be monitored until the medicines you were given have worn off.  As tissue healing occurs, you may  notice vaginal bleeding for 4-6 weeks after the procedure. You may also experience: ? Cramps. ? Thin, watery vaginal discharge that is light pink or brown in color. ? A need to urinate more frequently than usual. ? Nausea.  Do not drive for 24 hours if you were given a sedative.  Do not have sex or insert anything into your vagina until your health care provider approves. Summary  Endometrial ablation is done to treat the many causes of heavy menstrual bleeding.  The procedure may be done only after medications have been tried to control the bleeding.  Plan to have someone take you home from the hospital or clinic. This information is not intended to replace advice given to you by your health care provider. Make sure you discuss any questions you have with your health care provider. Document Released: 08/26/2004 Document Revised: 11/03/2016 Document Reviewed: 11/03/2016 Elsevier Interactive Patient Education  2017 [employer]  

## 2017-04-05 NOTE — Interval H&P Note (Signed)
History and Physical Interval Note:  04/05/2017 11:31 AM  Karla Ward  has presented today for surgery, with the diagnosis of Abnormal Uterine Bleeding Dysmenorrhea  The various methods of treatment have been discussed with the patient and family. After consideration of risks, benefits and other options for treatment, the patient has consented to  Procedure(s): DILATATION & CURETTAGE/HYSTEROSCOPY WITH NOVASURE ENDOMETRIAL ABLATION (N/A) as a surgical intervention .  The patient's history has been reviewed, patient examined, no change in status, stable for surgery.  I have reviewed the patient's chart and labs.  Questions were answered to the patient's satisfaction.     EURE,LUTHER H

## 2017-04-05 NOTE — Op Note (Signed)
Preoperative diagnosis: AUB, unresponsive to high dose megestrol                                        Dysmenorrhea                                          Postoperative diagnoses: Same as above   Procedure: Hysteroscopy, uterine curettage, endometrial ablation using Novasure  Surgeon: Lazaro ArmsEURE,LUTHER H   Anesthesia: Laryngeal mask airway  Findings: The endometrium was normal. There were no fibroid or other abnormalities.  Description of operation: The patient was taken to the operating room and placed in the supine position. She underwent general anesthesia using the laryngeal mask airway. She was placed in the dorsal lithotomy position and prepped and draped in the usual sterile fashion. A Graves speculum was placed and the anterior cervical lip was grasped with a single-tooth tenaculum. The cervix was dilated serially to allow passage of the hysteroscope. Diagnostic hysteroscopy was performed and was found to be normal. A vigorous uterine curettage was then performed and all tissue sent to pathology for evaluation.  I then proceeded to perform the Novasure endometrial ablation.  The cervical length was 3.0. The uterus sounded to  9.5 cm yielding a net length of 6.5 cm.  The endometrial cavity was 4.0 cm wide. The power was 142 watts.  The total time of therapy was 0 min 53 seconds. The array was evaluated after the procedure and tissue was adherent on all the dimensions of the surface, confirming fundal treatment as well.    All of the equipment worked well throughout the procedure.  The patient was awakened from anesthesia and taken to the recovery room in good stable condition all counts were correct. She received gentamicin and clindamycin and 30 mg of Toradol preoperatively. She will be discharged from the recovery room and followed up in the office in 1- 2 weeks.  EURE,LUTHER H 04/05/2017 12:10 PM

## 2017-04-06 ENCOUNTER — Encounter (HOSPITAL_COMMUNITY): Payer: Self-pay | Admitting: Obstetrics & Gynecology

## 2017-04-13 ENCOUNTER — Ambulatory Visit (INDEPENDENT_AMBULATORY_CARE_PROVIDER_SITE_OTHER): Payer: BLUE CROSS/BLUE SHIELD | Admitting: Obstetrics & Gynecology

## 2017-04-13 ENCOUNTER — Encounter: Payer: Self-pay | Admitting: Obstetrics & Gynecology

## 2017-04-13 VITALS — BP 148/98 | Wt 187.0 lb

## 2017-04-13 DIAGNOSIS — Z9889 Other specified postprocedural states: Secondary | ICD-10-CM

## 2017-04-13 MED ORDER — NORETHINDRONE 0.35 MG PO TABS: 1.0000 | ORAL_TABLET | Freq: Every day | ORAL | 11 refills | Status: DC

## 2017-04-13 NOTE — Progress Notes (Signed)
  HPI: Patient returns for routine postoperative follow-up having undergone hysteroscopy D&C endometrial ablation using NovaSure on 04/05/2017.  The patient's immediate postoperative recovery has been unremarkable. Since hospital discharge the patient reports some pink tinge watery discharge which is lightening and her cramps are getting better.   Current Outpatient Prescriptions: amLODipine (NORVASC) 5 MG tablet, Take 5 mg by mouth daily., Disp: , Rfl:  fluticasone (FLONASE) 50 MCG/ACT nasal spray, Place 1 spray into both nostrils daily as needed for allergies., Disp: , Rfl:  omeprazole (PRILOSEC) 20 MG capsule, Take 1 capsule (20 mg total) by mouth 2 (two) times daily before a meal., Disp: 60 capsule, Rfl: 2 HYDROcodone-acetaminophen (NORCO/VICODIN) 5-325 MG tablet, Take 1 tablet by mouth every 6 (six) hours as needed. (Patient not taking: Reported on 04/13/2017), Disp: 30 tablet, Rfl: 0 ketorolac (TORADOL) 10 MG tablet, Take 1 tablet (10 mg total) by mouth every 8 (eight) hours as needed. (Patient not taking: Reported on 04/13/2017), Disp: 15 tablet, Rfl: 0 meclizine (ANTIVERT) 25 MG tablet, Take 25 mg by mouth 3 (three) times daily as needed for dizziness., Disp: , Rfl:  ondansetron (ZOFRAN ODT) 8 MG disintegrating tablet, Take 1 tablet (8 mg total) by mouth every 8 (eight) hours as needed for nausea or vomiting. (Patient not taking: Reported on 04/13/2017), Disp: 20 tablet, Rfl: 0  No current facility-administered medications for this visit.     Blood pressure (!) 148/98, weight 187 lb (84.8 kg).  Physical Exam: Abdomen is benign the vaginas with minimal watery discharge the cervix is without lesions bimanual exam is nontender normal post ablation exam  Diagnostic Tests:   Pathology: Inactive endometrium and benign  Impression: Status post hysteroscopy D&C endometrial ablation using NovaSure with unremarkable postoperative course  Plan: We'll use Micronor as her birth control  method  Follow up: 1 year    Meds ordered this encounter  Medications  . norethindrone (MICRONOR,CAMILA,ERRIN) 0.35 MG tablet    Sig: Take 1 tablet (0.35 mg total) by mouth daily. Take 1 a day    Dispense:  1 Package    Refill:  11     Lazaro ArmsEURE,LUTHER H, MD

## 2017-05-01 ENCOUNTER — Other Ambulatory Visit (HOSPITAL_COMMUNITY): Payer: Self-pay | Admitting: Family Medicine

## 2017-05-01 DIAGNOSIS — Z1231 Encounter for screening mammogram for malignant neoplasm of breast: Secondary | ICD-10-CM

## 2017-06-05 ENCOUNTER — Ambulatory Visit (HOSPITAL_COMMUNITY): Payer: BLUE CROSS/BLUE SHIELD

## 2017-06-05 ENCOUNTER — Ambulatory Visit (HOSPITAL_COMMUNITY)
Admission: RE | Admit: 2017-06-05 | Discharge: 2017-06-05 | Disposition: A | Discharge status: Home / Self Care | Payer: BLUE CROSS/BLUE SHIELD | Source: Ambulatory Visit | Attending: Family Medicine | Admitting: Family Medicine

## 2017-06-05 ENCOUNTER — Other Ambulatory Visit (HOSPITAL_COMMUNITY): Payer: Self-pay | Admitting: Family Medicine

## 2017-06-05 DIAGNOSIS — Z1231 Encounter for screening mammogram for malignant neoplasm of breast: Secondary | ICD-10-CM

## 2017-06-09 ENCOUNTER — Telehealth: Payer: Self-pay | Admitting: *Deleted

## 2017-06-09 NOTE — Telephone Encounter (Signed)
Sure depo is fine

## 2017-06-19 ENCOUNTER — Other Ambulatory Visit: Payer: Self-pay | Admitting: Nurse Practitioner

## 2017-06-22 ENCOUNTER — Telehealth: Payer: Self-pay | Admitting: Gastroenterology

## 2017-06-22 ENCOUNTER — Encounter: Payer: Self-pay | Admitting: Gastroenterology

## 2017-06-22 MED ORDER — LIDOCAINE VISCOUS 2 % MT SOLN: 10.0000 mL | OROMUCOSAL | 0 refills | Status: DC | PRN

## 2017-06-22 NOTE — Telephone Encounter (Signed)
PT is aware.

## 2017-06-22 NOTE — Telephone Encounter (Signed)
Please tell the patient I am sending in viscous lidocaine to help with her breakthrough symptoms. This should help her until she has her followup appointment.

## 2017-06-22 NOTE — Addendum Note (Signed)
Addended by: Delane Ginger, ERIC A on: 06/22/2017 02:06 PM   Modules accepted: Orders

## 2017-06-22 NOTE — Telephone Encounter (Signed)
[phone] PLEASE CALL PATIENT, SHE HAS QUESTIONS ABOUT WHAT SHE CAN DO FOR HER REFLUX UNTIL HER UPCOMING APPT WITH [facility]

## 2017-06-22 NOTE — Telephone Encounter (Signed)
PT has had recent chest pain in the middle of her chest with nausea.  The pain is intermittent and no arm pain or other cardiac symptoms. She said Dr. Sudie Bailey did EKG in office and said that was fine. She has been taking Omeprazole bid and doing well. But now it is not helping at all. She has appt here in 2 weeks, and would like to be seen sooner.  Last saw Wynne Dust, NP in the office. Please advise!

## 2017-07-06 ENCOUNTER — Encounter: Payer: Self-pay | Admitting: Gastroenterology

## 2017-07-06 ENCOUNTER — Ambulatory Visit (INDEPENDENT_AMBULATORY_CARE_PROVIDER_SITE_OTHER): Payer: BLUE CROSS/BLUE SHIELD | Admitting: Gastroenterology

## 2017-07-06 DIAGNOSIS — Z1211 Encounter for screening for malignant neoplasm of colon: Secondary | ICD-10-CM | POA: Diagnosis not present

## 2017-07-06 DIAGNOSIS — K219 Gastro-esophageal reflux disease without esophagitis: Secondary | ICD-10-CM | POA: Diagnosis not present

## 2017-07-06 MED ORDER — OMEPRAZOLE 20 MG PO CPDR: DELAYED_RELEASE_CAPSULE | ORAL | 3 refills | Status: DC

## 2017-07-06 MED ORDER — LIDOCAINE VISCOUS 2 % MT SOLN: OROMUCOSAL | 1 refills | Status: DC

## 2017-07-06 NOTE — Progress Notes (Signed)
   Subjective:    Patient ID: Karla Ward, female    DOB: 02/11/1971, 46 y.o.   MRN: [id number]  Gareth MorganKnowlton, Steve, MD  HPI Symptoms: burning chest pain and nausea. FLARES LAST: COUPLE DAYS. MAY HAVE PAIN RIGHT IN THE MIDDLE OF ABDOMEN(BURNING). BMs: NL. LAST FLARE AFTER BWW-BBQ SAUCE/CHICKEN TENDERS. MAY DO FRIED EVER NOW AND THEN. GAINED ~10 LBS IN PAST 2 YEARS.  PT DENIES FEVER, CHILLS, HEMATOCHEZIA, nausea, vomiting, melena, diarrhea, SHORTNESS OF BREATH,  CHANGE IN BOWEL IN HABITS, constipation, abdominal pain, problems swallowing, OR problems with sedation.  Past Medical History:  Diagnosis Date  . Anemia   . Gastritis   . GERD (gastroesophageal reflux disease)   . Hypertension   . Status post dilation of esophageal narrowing   . Vertigo    Past Surgical History:  Procedure Laterality Date  . DILITATION & CURRETTAGE/HYSTROSCOPY WITH NOVASURE ABLATION N/A 04/05/2017     . ESOPHAGOGASTRODUODENOSCOPY N/A 09/02/2015   WUJ:WJXBSLF:mild non-erosive gastritis/stricture at the gastroesophageal junction   . NONE TO DATE  08/13/15  . SAVORY DILATION  09/02/2015       Allergies  Allergen Reactions  . Benadryl [Diphenhydramine Hcl (Sleep)]     tachycardia  . Penicillins Hives        Current Outpatient Prescriptions  Medication Sig Dispense Refill  . amLODipine (NORVASC) 5 MG tablet Take 5 mg by mouth daily.    . fluticasone (FLONASE) 50 MCG/ACT nasal spray Place 1 spray into both nostrils daily as needed for allergies.    Marland Kitchen. meclizine (ANTIVERT) 25 MG tablet Take 25 mg by mouth 3 (three) times daily as needed for dizziness.    . norethindrone (MICRONOR,CAMILA,ERRIN) 0.35 MG tablet Take 1 tablet (0.35 mg total) by mouth daily. Take 1 a day    . omeprazole 20 MG capsule TAKE (1) CAPSULE BY MOUTH TWICE DAILY.    .      .      .      .       Family History  Problem Relation Age of Onset  . Throat cancer Father [id number]  . Hypertension Father   . Hypertension Mother   . Congestive Heart  Failure Sister   . Colon cancer Neg Hx    Review of Systems PER HPI OTHERWISE ALL SYSTEMS ARE NEGATIVE.    Objective:   Physical Exam  Constitutional: She is oriented to person, place, and time. She appears well-developed and well-nourished. No distress.  HENT:  Head: Normocephalic and atraumatic.  Mouth/Throat: Oropharynx is clear and moist. No oropharyngeal exudate.  Eyes: Pupils are equal, round, and reactive to light. No scleral icterus.  Neck: Normal range of motion. Neck supple.  Cardiovascular: Normal rate, regular rhythm and normal heart sounds.   Pulmonary/Chest: Effort normal and breath sounds normal. No respiratory distress.  Abdominal: Soft. Bowel sounds are normal. She exhibits no distension. There is no tenderness.  Musculoskeletal: She exhibits no edema.  Lymphadenopathy:    She has no cervical adenopathy.  Neurological: She is alert and oriented to person, place, and time.  Psychiatric: She has a normal mood and affect.  Vitals reviewed.     Assessment & Plan:

## 2017-07-06 NOTE — Patient Instructions (Addendum)
DRINK WATER TO KEEP YOUR URINE LIGHT YELLOW.  AVOID TRIGGERS FOR REFLUX. SEE INFO BELOW.  CONTINUE OMEPRAZOLE.  TAKE 30 MINUTES PRIOR TO YOUR MEALS TWICE DAILY for one month then ONCE DAILY.  USE VISCOUS LIDOCAINE 2 TSP IF NEEDED FOR FLARES OF HEARTBURN, CHEST PAIN, OR UPPER ABDOMINAL PAIN. MAY REPEAT DOSE EVERY FOUR HOURS. NO MORE THAN  8 DOSES A DAY.  FOLLOW UP IN 1 YEAR.   YOU NEED A SCREENING COLONOSCOPY IN 2019.   Lifestyle and home remedies TO MANAGE REFLUX/CHEST PAIN  You may eliminate or reduce the frequency of heartburn by making the following lifestyle changes:  . Control your weight. Being overweight is a major risk factor for heartburn and GERD. Excess pounds put pressure on your abdomen, pushing up your stomach and causing acid to back up into your esophagus.   . Eat smaller meals. 4 TO 6 MEALS A DAY. This reduces pressure on the lower esophageal sphincter, helping to prevent the valve from opening and acid from washing back into your esophagus.   Allena Earing. Loosen your belt. Clothes that fit tightly around your waist put pressure on your abdomen and the lower esophageal sphincter.   . Eliminate heartburn triggers. Everyone has specific triggers. Common triggers such as fatty or fried foods, spicy food, tomato sauce, carbonated beverages, alcohol, chocolate, mint, garlic, onion, caffeine and nicotine may make heartburn worse.   Marland Kitchen. Avoid stooping or bending. Tying your shoes is OK. Bending over for longer periods to weed your garden isn't, especially soon after eating.   . Don't lie down after a meal. Wait at least three to four hours after eating before going to bed, and don't lie down right after eating.   Marland Kitchen. PUT THE HEAD OF YOUR BED ON 6 INCH BLOCKS.   Alternative medicine . Several home remedies exist for treating GERD, but they provide only temporary relief. They include drinking baking soda (sodium bicarbonate) added to water or drinking other fluids such as baking soda  mixed with cream of tartar and water.  . Although these liquids create temporary relief by neutralizing, washing away or buffering acids, eventually they aggravate the situation by adding gas and fluid to your stomach, increasing pressure and causing more acid reflux. Further, adding more sodium to your diet may increase your blood pressure and add stress to your heart, and excessive bicarbonate ingestion can alter the acid-base balance in your body.

## 2017-07-06 NOTE — Assessment & Plan Note (Signed)
AVERAGE RISK AFRICAN AMERICAN. NO BRBPR OR MELENA.  SCREENING COLONOSCOPY IN 2019.

## 2017-07-06 NOTE — Assessment & Plan Note (Signed)
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 SYMPTOMS WERE NOT IDEALLY CONTROLLED BUT NOW SYMPTOMS CONTROLLED.   DRINK WATER TO KEEP YOUR URINE LIGHT YELLOW. AVOID TRIGGERS FOR REFLUX.  HANDOUT GIVEN. CONTINUE OMEPRAZOLE.  TAKE 30 MINUTES PRIOR TO YOUR MEALS TWICE DAILY for one month then ONCE DAILY. FOLLOW UP IN 1 YEAR.

## 2017-07-07 NOTE — Progress Notes (Signed)
cc'ed to pcp °

## 2017-07-07 NOTE — Progress Notes (Signed)
ON RECALL  °

## 2017-09-06 ENCOUNTER — Telehealth: Payer: Self-pay | Admitting: Obstetrics & Gynecology

## 2017-09-06 NOTE — Telephone Encounter (Signed)
Patient called stating she started having some light bleeding and cramping on Monday but it has started to get a little heavier. She is concerned and asking if it is normal to still bleed after an ablation, she has not had any bleeding until now since her surgery.  Informed patient she may still have some bleeding even after an ablation.  Also states she has not taken her BCP because you informed her to stop taking if it made her BP go up in which it did. She wants to take the Depo injection if possible. Please advise on both issues.

## 2017-09-06 NOTE — Telephone Encounter (Signed)
I inform every patient that 30-40% of patients have no more bleeding, 96% of patients have far less bleeding  Also do not take OCP, and hold off on the depo  Wait and see how the bleeding goes, if we did anything it would be micronor which is a progesterone only pill

## 2017-09-06 NOTE — Telephone Encounter (Signed)
Patient called stating that she would like a call back from Dr. Forestine ChuteEure's nurse. Pt states that she has a medical question. Please contact pt

## 2017-09-07 ENCOUNTER — Telehealth: Payer: Self-pay | Admitting: *Deleted

## 2017-09-07 NOTE — Telephone Encounter (Signed)
Patient called back asking "so bleeding is normal after ablation?" Informed patient that yes, 96% of people have less bleeding. Patient states she started her cycle on Tuesday and it has been lighter. Also wanted to make him aware that she was on Depo for years and it had no effect on her BP. Informed patient to call our office when her cycle has ended and we can discuss again with Dr Despina HiddenEure. Pt verbalized understanding.

## 2017-09-07 NOTE — Telephone Encounter (Signed)
Patient came in our office today regarding spotting that she has called about yesterday. The front office staff stated they read the message response from Dr Despina HiddenEure to the patient and patient stated she was a little uncertain about his response but did not have any further questions. I called patient and left a message requesting the patient call me back if she had further questions.

## 2017-09-15 ENCOUNTER — Telehealth: Payer: Self-pay | Admitting: *Deleted

## 2017-09-15 ENCOUNTER — Telehealth: Payer: Self-pay | Admitting: Obstetrics & Gynecology

## 2017-09-15 MED ORDER — MEDROXYPROGESTERONE ACETATE 150 MG/ML IM SUSP: 150.0000 mg | INTRAMUSCULAR | 3 refills | Status: DC

## 2017-09-15 NOTE — Telephone Encounter (Signed)
Patient came into the office stating she needed to be on some type of birth control. She is requesting to start back on the Depo since she was on it before with no problems. Informed patient of our most recent conversation but patient states she wants Depo. Bleeding last time lasted 4 days. Please advise.

## 2017-09-15 NOTE — Telephone Encounter (Signed)
Informed patient Depo was sent to [facility]. Will need to pick up before appt Monday. LMP 11/6. Intercourse 2 days ago. Advised patient to not have intercourse until after injection. Pt verbalized understanding.

## 2017-09-15 NOTE — Telephone Encounter (Signed)
Depo was e prescribed 

## 2017-09-18 ENCOUNTER — Ambulatory Visit (INDEPENDENT_AMBULATORY_CARE_PROVIDER_SITE_OTHER): Payer: BLUE CROSS/BLUE SHIELD

## 2017-09-18 VITALS — BP 122/90 | Ht 64.0 in | Wt 180.8 lb

## 2017-09-18 DIAGNOSIS — Z3042 Encounter for surveillance of injectable contraceptive: Secondary | ICD-10-CM

## 2017-09-18 DIAGNOSIS — Z3202 Encounter for pregnancy test, result negative: Secondary | ICD-10-CM | POA: Diagnosis not present

## 2017-09-18 LAB — POCT URINE PREGNANCY: Preg Test, Ur: NEGATIVE

## 2017-09-18 MED ORDER — MEDROXYPROGESTERONE ACETATE 150 MG/ML IM SUSP
150.0000 mg | Freq: Once | INTRAMUSCULAR | Status: AC
  Administered 2017-09-18: 150 mg via INTRAMUSCULAR

## 2017-09-18 NOTE — Progress Notes (Signed)
PT here for depo shot 150 mg IM given rt deltoid. Tolerated well. Return 12 week for next shot. Pad CMA 

## 2017-10-28 ENCOUNTER — Emergency Department (HOSPITAL_COMMUNITY): Payer: BLUE CROSS/BLUE SHIELD

## 2017-10-28 ENCOUNTER — Emergency Department (HOSPITAL_COMMUNITY)
Admission: EM | Admit: 2017-10-28 | Discharge: 2017-10-28 | Disposition: A | Discharge status: Home / Self Care | Payer: BLUE CROSS/BLUE SHIELD | Attending: Emergency Medicine | Admitting: Emergency Medicine

## 2017-10-28 ENCOUNTER — Other Ambulatory Visit: Payer: Self-pay

## 2017-10-28 ENCOUNTER — Encounter (HOSPITAL_COMMUNITY): Payer: Self-pay | Admitting: *Deleted

## 2017-10-28 DIAGNOSIS — B9789 Other viral agents as the cause of diseases classified elsewhere: Secondary | ICD-10-CM | POA: Insufficient documentation

## 2017-10-28 DIAGNOSIS — J111 Influenza due to unidentified influenza virus with other respiratory manifestations: Secondary | ICD-10-CM

## 2017-10-28 DIAGNOSIS — I10 Essential (primary) hypertension: Secondary | ICD-10-CM | POA: Diagnosis not present

## 2017-10-28 DIAGNOSIS — J069 Acute upper respiratory infection, unspecified: Secondary | ICD-10-CM

## 2017-10-28 DIAGNOSIS — Z79899 Other long term (current) drug therapy: Secondary | ICD-10-CM | POA: Insufficient documentation

## 2017-10-28 DIAGNOSIS — R69 Illness, unspecified: Secondary | ICD-10-CM

## 2017-10-28 DIAGNOSIS — R05 Cough: Secondary | ICD-10-CM | POA: Diagnosis present

## 2017-10-28 MED ORDER — ACETAMINOPHEN 325 MG PO TABS: 650.0000 mg | ORAL_TABLET | Freq: Once | ORAL | Status: DC

## 2017-10-28 MED ORDER — ALBUTEROL SULFATE HFA 108 (90 BASE) MCG/ACT IN AERS: 1.0000 | INHALATION_SPRAY | Freq: Four times a day (QID) | RESPIRATORY_TRACT | 0 refills | Status: DC | PRN

## 2017-10-28 MED ORDER — BENZONATATE 100 MG PO CAPS: 100.0000 mg | ORAL_CAPSULE | Freq: Three times a day (TID) | ORAL | 0 refills | Status: DC

## 2017-10-28 NOTE — ED Provider Notes (Signed)
[facility] Provider Note   CSN: [id number] Arrival date & time: 10/28/17  1709     History   Chief Complaint Chief Complaint  Patient presents with  . URI    HPI Karla Ward is a 46 y.o. female.  HPI  Patient is a 46 year old female who presents the ED complaining of a 3-day history of cough, wheezing, headache, myalgias, and sore throat.  Patient also states that she has chest wall pain that hurts when she coughs.  Her pain is worse when she presses on her chest.  She denies any shortness of breath or wheezing currently.  Patient states that she feels like her heart is racing.  She states that she normally only feels this way when she is nervous and her anxious, and she endorses feeling this way currently.  She denies any other symptoms at this time.  She did not receive her flu shot this year.  Past Medical History:  Diagnosis Date  . Anemia   . Gastritis   . GERD (gastroesophageal reflux disease)   . Hypertension   . Status post dilation of esophageal narrowing   . Vertigo     Patient Active Problem List   Diagnosis Date Noted  . Colon cancer screening 07/06/2017  . Menorrhagia 05/30/2016  . GERD (gastroesophageal reflux disease) 08/13/2015  . Palpitations 11/22/2012    Past Surgical History:  Procedure Laterality Date  . DILITATION & CURRETTAGE/HYSTROSCOPY WITH NOVASURE ABLATION N/A 04/05/2017   Procedure: DILATATION & CURETTAGE/HYSTEROSCOPY WITH NOVASURE ENDOMETRIAL ABLATION;  Surgeon: Lazaro ArmsEure, Luther H, MD;  Location: [facility] ORS;  Service: Gynecology;  Laterality: N/A;  . ESOPHAGOGASTRODUODENOSCOPY N/A 09/02/2015   WUJ:WJXBSLF:mild non-erosive gastritis/stricture at the gastroesophageal junction   . NONE TO DATE  08/13/15  . SAVORY DILATION  09/02/2015   Procedure: SAVORY DILATION;  Surgeon: West BaliSandi L Fields, MD;  Location: [facility] ENDO SUITE;  Service: Endoscopy;;    OB History    Gravida Para Term Preterm AB Living   2 2   2   2    SAB  TAB Ectopic Multiple Live Births           2       Home Medications    Prior to Admission medications   Medication Sig Start Date End Date Taking? Authorizing Provider  albuterol (PROVENTIL HFA;VENTOLIN HFA) 108 (90 Base) MCG/ACT inhaler Inhale 1-2 puffs into the lungs every 6 (six) hours as needed for wheezing or shortness of breath. 10/28/17   Lizzy Hamre S, PA-C  amLODipine (NORVASC) 5 MG tablet Take 5 mg by mouth daily.    [provider]  benzonatate (TESSALON) 100 MG capsule Take 1 capsule (100 mg total) by mouth every 8 (eight) hours. 10/28/17   Keonia Pasko S, PA-C  fluticasone (FLONASE) 50 MCG/ACT nasal spray Place 1 spray into both nostrils daily as needed for allergies. 07/15/16   [provider]  HYDROcodone-acetaminophen (NORCO/VICODIN) 5-325 MG tablet Take 1 tablet by mouth every 6 (six) hours as needed. Patient not taking: Reported on 04/13/2017 04/05/17   Lazaro ArmsEure, Luther H, MD  ketorolac (TORADOL) 10 MG tablet Take 1 tablet (10 mg total) by mouth every 8 (eight) hours as needed. Patient not taking: Reported on 04/13/2017 04/05/17   Lazaro ArmsEure, Luther H, MD  lidocaine (XYLOCAINE) 2 % solution Use as directed 10 mLs in the mouth or throat every 4 (four) hours as needed for mouth pain. Patient not taking: Reported on 07/06/2017 06/22/17   Wynne DustGill, Eric  A, NP  lidocaine (XYLOCAINE) 2 % solution 2 TSP  PO Q4H PRN FOR ABDOMINAL OR CHEST PAIN.  NO MORE THAN 8 DOSES A DAY. Patient not taking: Reported on 09/18/2017 07/06/17   West BaliFields, Sandi L, MD  meclizine (ANTIVERT) 25 MG tablet Take 25 mg by mouth 3 (three) times daily as needed for dizziness.    [provider]  medroxyPROGESTERone (DEPO-PROVERA) 150 MG/ML injection Inject 1 mL (150 mg total) every 3 (three) months into the muscle. 09/15/17   Lazaro ArmsEure, Luther H, MD  omeprazole (PRILOSEC) 20 MG capsule 1 PO 30 MINS PRIOR TO MEALS TWICE DAILY 07/06/17   Fields, Darleene CleaverSandi L, MD  ondansetron (ZOFRAN ODT) 8 MG disintegrating tablet  Take 1 tablet (8 mg total) by mouth every 8 (eight) hours as needed for nausea or vomiting. 04/05/17   Lazaro ArmsEure, Luther H, MD    Family History Family History  Problem Relation Age of Onset  . Throat cancer Father [id number]  . Hypertension Father   . Hypertension Mother   . Congestive Heart Failure Sister   . Colon cancer Neg Hx   . Colon polyps Neg Hx     Social History Social History   Tobacco Use  . Smoking status: Never Smoker  . Smokeless tobacco: Never Used  Substance Use Topics  . Alcohol use: No  . Drug use: No     Allergies   Benadryl [diphenhydramine hcl (sleep)] and Penicillins   Review of Systems Review of Systems  Constitutional: Negative for chills and fever.  HENT: Positive for congestion and sore throat. Negative for ear pain, rhinorrhea, sinus pressure and sinus pain.   Eyes: Negative for pain and visual disturbance.  Respiratory: Positive for cough and wheezing. Negative for shortness of breath.   Cardiovascular: Positive for chest pain and palpitations.  Gastrointestinal: Negative for abdominal pain, constipation, diarrhea, nausea and vomiting.  Genitourinary: Negative for dysuria and hematuria.  Musculoskeletal: Positive for myalgias. Negative for arthralgias and back pain.  Skin: Negative for color change and rash.  Neurological: Positive for headaches. Negative for seizures and syncope.  All other systems reviewed and are negative.    Physical Exam Updated Vital Signs BP 137/76 (BP Location: Right Arm)   Pulse (!) 107 Comment: Coutni, PA aware  Temp (!) 100.6 F (38.1 C) (Oral)   Resp 18   SpO2 100%   Physical Exam  Constitutional: She appears well-developed and well-nourished. No distress.  HENT:  Head: Normocephalic and atraumatic.  Right Ear: External ear normal.  Left Ear: External ear normal.  Mouth/Throat: No oropharyngeal exudate.  Fluid behind bilateral TMs.,  No bulging or erythema noted.  No pharyngeal erythema noted.  No tonsillar  swelling or exudate.  Uvula midline.  Nasal turbinates swollen on the left.  Eyes: Conjunctivae are normal. Pupils are equal, round, and reactive to light.  Neck: Neck supple.  Cardiovascular: Regular rhythm, normal heart sounds and intact distal pulses.  No murmur heard. Mild tachycardia  Pulmonary/Chest: Effort normal and breath sounds normal. No respiratory distress.  Abdominal: Soft. Bowel sounds are normal. There is no tenderness.  Musculoskeletal: She exhibits no edema.  No swelling, erythema, or pain to the bilateral calves.  Pulses intact.  Cap refill brisk.  Neurological: She is alert.  Skin: Skin is warm and dry.  Psychiatric: She has a normal mood and affect.  Nursing note and vitals reviewed.    ED Treatments / Results  Labs (all labs ordered are listed, but only abnormal results are displayed)  Labs Reviewed - No data to display  EKG  EKG Interpretation None       Radiology Dg Chest 2 View  Result Date: 10/28/2017 CLINICAL DATA:  Cough and fever EXAM: CHEST  2 VIEW COMPARISON:  12/13/2015 chest radiograph. FINDINGS: Stable cardiomediastinal silhouette with normal heart size. No pneumothorax. No pleural effusion. Lungs appear clear, with no acute consolidative airspace disease and no pulmonary edema. IMPRESSION: No active cardiopulmonary disease. Electronically Signed   By: Delbert Phenix M.D.   On: 10/28/2017 18:32    Procedures Procedures (including critical care time)  Medications Ordered in ED Medications  acetaminophen (TYLENOL) tablet 650 mg (650 mg Oral Refused 10/28/17 2148)     Initial Impression / Assessment and Plan / ED Course  I have reviewed the triage vital signs and the nursing notes.  Pertinent labs & imaging results that were available during my care of the patient were reviewed by me and considered in my medical decision making (see chart for details).  Staffed patient with [place], [place].  He evaluated the patient in the ED and agrees that  symptoms are likely viral and that the patient is safe for discharge.     Offered to give the patient Tylenol in the ED, but she declines and states that she will take some when she gets home.  I discussed the results of her chest x-ray showing no pneumonia.  I discussed plan for discharge with an albuterol inhaler and Tessalon Perles.  Advised hydration and rest over the next few days.  Discussed plan for follow-up with primary care within 1 week if symptoms do not resolve.  Advised to return to the ER if any of breath, chest pain, worsening symptoms.   Final Clinical Impressions(s) / ED Diagnoses   Final diagnoses:  Influenza-like illness  Viral URI with cough   Patient presented with flulike illness for 3 days.  She is nontoxic appearing in the ED. mild tachycardia likely secondary to fever and anxiety.  Low concern for PE given no tobacco use, no calf swelling/erythema/pain, shortness of breath, no recent surgeries, no estrogen use, and no recent prolonged travel or periods of immobility. No h/o CA. Sxs likely viral in nature.  Discussed plan to discharge patient with albuterol inhaler and Tessalon Perles.  Gave patient strict return precautions.  Advised her to follow-up with her primary care 1 week.  ED Discharge Orders        Ordered    albuterol (PROVENTIL HFA;VENTOLIN HFA) 108 (90 Base) MCG/ACT inhaler  Every 6 hours PRN     10/28/17 2144    benzonatate (TESSALON) 100 MG capsule  Every 8 hours     10/28/17 2153       Rayne Du 10/28/17 2202    Eber Hong, MD 10/29/17 1004

## 2017-10-28 NOTE — ED Triage Notes (Addendum)
Pt reports several days of headache, cough, bodyaches, sore throat. Has chest wall pain when she coughs and takes a deep breath. Mask on pt at triage. No resp distress is noted.

## 2017-10-28 NOTE — ED Provider Notes (Signed)
9:56 PM Patient seen with Couture PA-C.   Patient with 3-day history of viral symptoms starting with cough, progressing to muscle aches and fever.  Patient has had wheezing at times with the cough.  She also has had a sore throat.  She has been taking Tussin cough syrup without relief.  No lower extremity swelling or history of blood clots.  She has some chest pain with coughing otherwise no persistent pain. Well-appearing. Patient denies risk factors for pulmonary embolism including: unilateral leg swelling, history of DVT/PE/other blood clots, use of exogenous hormones, recent immobilizations, recent surgery, recent travel (>4hr segment), malignancy, hemoptysis.    Exam:  Gen NAD; Heart mild tachycardia, nml S1,S2, no m/r/g; Lungs CTAB; Abd soft, NT, no rebound or guarding; Ext 2+ pedal pulses bilaterally, no edema.  CXR: no PNA.  Suspect viral syndrome, influenza vs bronchitis. Home with symptomatic tx. Pt in agreement.   BP 137/76 (BP Location: Right Arm)   Pulse (!) 107 Comment: Coutni, PA aware  Temp (!) 100.6 F (38.1 C) (Oral)   Resp 18   SpO2 100%       Karla Ward, Karla Venning, PA-C 10/28/17 2159    Eber HongMiller, Brian, MD 10/29/17 1004

## 2017-10-28 NOTE — Discharge Instructions (Signed)
You can manage your fevers and body aches by rotating Tylenol and ibuprofen.  Make sure to hydrate and rest over the next few days.  May use your inhaler wheezing or shortness of breath.  Please follow-up with your primary care provider within 1 week if your symptoms do not improve or resolve.  Please return to the ER if you have any chest pain, shortness of breath, or any new or worsening symptoms.

## 2017-12-11 ENCOUNTER — Ambulatory Visit: Payer: BLUE CROSS/BLUE SHIELD

## 2017-12-15 ENCOUNTER — Ambulatory Visit (INDEPENDENT_AMBULATORY_CARE_PROVIDER_SITE_OTHER): Payer: BLUE CROSS/BLUE SHIELD

## 2017-12-15 VITALS — Wt 180.2 lb

## 2017-12-15 DIAGNOSIS — Z3042 Encounter for surveillance of injectable contraceptive: Secondary | ICD-10-CM | POA: Diagnosis not present

## 2017-12-15 DIAGNOSIS — Z3202 Encounter for pregnancy test, result negative: Secondary | ICD-10-CM

## 2017-12-15 LAB — POCT URINE PREGNANCY: PREG TEST UR: NEGATIVE

## 2017-12-15 MED ORDER — MEDROXYPROGESTERONE ACETATE 150 MG/ML IM SUSP
150.0000 mg | Freq: Once | INTRAMUSCULAR | Status: AC
  Administered 2017-12-15: 150 mg via INTRAMUSCULAR

## 2017-12-15 NOTE — Progress Notes (Signed)
Pt here for depo injection 150 mg IM given lt deltoid. Tolerated well. Return 12 weeks for next injection.pad CMA 

## 2018-03-08 ENCOUNTER — Ambulatory Visit (INDEPENDENT_AMBULATORY_CARE_PROVIDER_SITE_OTHER): Payer: BLUE CROSS/BLUE SHIELD

## 2018-03-08 VITALS — Ht 63.0 in | Wt 186.0 lb

## 2018-03-08 DIAGNOSIS — Z3202 Encounter for pregnancy test, result negative: Secondary | ICD-10-CM | POA: Diagnosis not present

## 2018-03-08 DIAGNOSIS — Z3042 Encounter for surveillance of injectable contraceptive: Secondary | ICD-10-CM | POA: Diagnosis not present

## 2018-03-08 LAB — POCT URINE PREGNANCY: PREG TEST UR: NEGATIVE

## 2018-03-08 MED ORDER — MEDROXYPROGESTERONE ACETATE 150 MG/ML IM SUSP
150.0000 mg | Freq: Once | INTRAMUSCULAR | Status: AC
  Administered 2018-03-08: 150 mg via INTRAMUSCULAR

## 2018-03-08 NOTE — Progress Notes (Signed)
Pt here for depo injection 150 mg IM given rt deltoid. Tolerated well. Return 12 week for next injection. Pad CMA

## 2018-03-09 ENCOUNTER — Ambulatory Visit: Payer: BLUE CROSS/BLUE SHIELD

## 2018-05-01 ENCOUNTER — Telehealth: Payer: Self-pay | Admitting: *Deleted

## 2018-05-01 NOTE — Telephone Encounter (Signed)
PT LAST SEEN 9 MOS AGO. IF SHE IS HAVING SYMPTOMS, SHE SHOULD:  DRINK WATER TO KEEP YOUR URINE LIGHT YELLOW.  CONTINUE YOUR WEIGHT LOSS EFFORTS. LOSE 10 LBS.  AVOID TRIGGERS FOR REFLUX.   CONTINUE OMEPRAZOLE. INCREASE 30 MINUTES PRIOR TO YOUR MEALS TWICE DAILY WHEN HER SYMPTOMS ARE NOT CONTROLLED AND THEN ONCE DAILY WHEN THEY ARE.  USE VISCOUS LIDOCAINE 2 TSP IF NEEDED FOR FLARES OF HEARTBURN, CHEST PAIN, OR UPPER ABDOMINAL PAIN. MAY REPEAT DOSE EVERY FOUR HOURS. NO MORE THAN  8 DOSES A DAY.  FOLLOW UP WITH NEXT AVAILABLE [facility].

## 2018-05-01 NOTE — Telephone Encounter (Signed)
Patient walked into the office. Reports she is having increased heart burn, she eats very little and feels full, stomach hurts all the time. She wanted further recs. She is taking omeprazole.

## 2018-05-02 ENCOUNTER — Encounter: Payer: Self-pay | Admitting: Gastroenterology

## 2018-05-02 NOTE — Telephone Encounter (Signed)
PT is aware and OK to schedule appointment.

## 2018-05-02 NOTE — Telephone Encounter (Signed)
PATIENT SCHEDULED AND LETTER SENT  °

## 2018-05-29 ENCOUNTER — Ambulatory Visit (INDEPENDENT_AMBULATORY_CARE_PROVIDER_SITE_OTHER): Payer: BLUE CROSS/BLUE SHIELD

## 2018-05-29 VITALS — Ht 64.0 in | Wt 187.0 lb

## 2018-05-29 DIAGNOSIS — Z3042 Encounter for surveillance of injectable contraceptive: Secondary | ICD-10-CM

## 2018-05-29 DIAGNOSIS — Z3202 Encounter for pregnancy test, result negative: Secondary | ICD-10-CM

## 2018-05-29 LAB — POCT URINE PREGNANCY: Preg Test, Ur: NEGATIVE

## 2018-05-29 MED ORDER — MEDROXYPROGESTERONE ACETATE 150 MG/ML IM SUSP
150.0000 mg | Freq: Once | INTRAMUSCULAR | Status: AC
  Administered 2018-05-29: 150 mg via INTRAMUSCULAR

## 2018-05-29 NOTE — Progress Notes (Addendum)
Pt here for depo injection 150 mg IM given lt deltoid. Tolerated well.Return 12 weeks for next injection. Pad CMA 

## 2018-05-31 ENCOUNTER — Ambulatory Visit: Payer: BLUE CROSS/BLUE SHIELD

## 2018-07-04 ENCOUNTER — Other Ambulatory Visit: Payer: Self-pay | Admitting: Gastroenterology

## 2018-08-01 ENCOUNTER — Ambulatory Visit (INDEPENDENT_AMBULATORY_CARE_PROVIDER_SITE_OTHER): Payer: BLUE CROSS/BLUE SHIELD | Admitting: Gastroenterology

## 2018-08-01 ENCOUNTER — Encounter: Payer: Self-pay | Admitting: Gastroenterology

## 2018-08-01 DIAGNOSIS — K219 Gastro-esophageal reflux disease without esophagitis: Secondary | ICD-10-CM | POA: Diagnosis not present

## 2018-08-01 DIAGNOSIS — Z1211 Encounter for screening for malignant neoplasm of colon: Secondary | ICD-10-CM

## 2018-08-01 MED ORDER — OMEPRAZOLE 20 MG PO CPDR: DELAYED_RELEASE_CAPSULE | ORAL | 3 refills | Status: DC

## 2018-08-01 NOTE — Progress Notes (Signed)
ON RECALL  °

## 2018-08-01 NOTE — Assessment & Plan Note (Signed)
SYMPTOMS FAIRLY WELL CONTROLLED.  DRINK WATER TO KEEP YOUR URINE LIGHT YELLOW. CONTINUE YOUR WEIGHT LOSS EFFORTS. YOUR BODY MASS INDEX IS OVER 30 WHICH MEANS YOU ARE OBESE. OBESITY IS ASSOCIATED WITH AN INCREASED FOR CIRRHOSIS AND ALL CANCERS, INCLUDING ESOPHAGEAL AND COLON CANCER. A WEIGHT OF 170 lbs or less  WILL GET YOUR BODY MASS INDEX(BMI) UNDER 30. FOLLOW A LOW FAT DIET.  HANDOUT GIVEN. AVOID REFLUX TRIGGERS.  HANDOUT GIVEN. CONTINUE OMEPRAZOLE.  TAKE 30 MINUTES PRIOR TO YOUR MEALS TWICE DAILY. FOLLOW UP IN 6 MOS.

## 2018-08-01 NOTE — Progress Notes (Signed)
CC'D TO PCP °

## 2018-08-01 NOTE — Patient Instructions (Signed)
DRINK WATER TO KEEP YOUR URINE LIGHT YELLOW.  CONTINUE YOUR WEIGHT LOSS EFFORTS. YOUR BODY MASS INDEX IS OVER 30 WHICH MEANS YOU ARE OBESE. OBESITY IS ASSOCIATED WITH AN INCREASED FOR CIRRHOSIS AND ALL CANCERS, INCLUDING ESOPHAGEAL AND COLON CANCER. A WEIGHT OF 170 lbs or less  WILL GET YOUR BODY MASS INDEX(BMI) UNDER 30.  FOLLOW A LOW FAT DIET.  SEE HANDOUT.  AVOID REFLUX TRIGGERS.  SEE HANDOUT.  CONTINUE OMEPRAZOLE.  TAKE 30 MINUTES PRIOR TO YOUR MEALS TWICE DAILY.  FOLLOW UP IN 6 MOS.   Lifestyle and home remedies TO MANAGE REFLUX/CHEST PAIN  You may eliminate or reduce the frequency of heartburn by making the following lifestyle changes:  . Control your weight. Being overweight is a major risk factor for heartburn and GERD. Excess pounds put pressure on your abdomen, pushing up your stomach and causing acid to back up into your esophagus.   . Eat smaller meals. 4 TO 6 MEALS A DAY. This reduces pressure on the lower esophageal sphincter, helping to prevent the valve from opening and acid from washing back into your esophagus.   [place] Earing your belt. Clothes that fit tightly around your waist put pressure on your abdomen and the lower esophageal sphincter.   . Eliminate heartburn triggers. Everyone has specific triggers. Common triggers such as fatty or fried foods, spicy food, tomato sauce, carbonated beverages, alcohol, chocolate, mint, garlic, onion, caffeine and nicotine may make heartburn worse.   Marland Kitchen Avoid stooping or bending. Tying your shoes is OK. Bending over for longer periods to weed your garden isn't, especially soon after eating.   . Don't lie down after a meal. Wait at least three to four hours after eating before going to bed, and don't lie down right after eating.    Alternative medicine . Several home remedies exist for treating GERD, but they provide only temporary relief. They include drinking baking soda (sodium bicarbonate) added to water or drinking other fluids  such as baking soda mixed with cream of tartar and water.  . Although these liquids create temporary relief by neutralizing, washing away or buffering acids, eventually they aggravate the situation by adding gas and fluid to your stomach, increasing pressure and causing more acid reflux. Further, adding more sodium to your diet may increase your blood pressure and add stress to your heart, and excessive bicarbonate ingestion can alter the acid-base balance in your body.      Low-Fat Diet BREADS, CEREALS, PASTA, RICE, DRIED PEAS, AND BEANS These products are high in carbohydrates and most are low in fat. Therefore, they can be increased in the diet as substitutes for fatty foods. They too, however, contain calories and should not be eaten in excess. Cereals can be eaten for snacks as well as for breakfast.   FRUITS AND VEGETABLES It is good to eat fruits and vegetables. Besides being sources of fiber, both are rich in vitamins and some minerals. They help you get the daily allowances of these nutrients. Fruits and vegetables can be used for snacks and desserts.  MEATS Limit lean meat, chicken, [place], and fish to no more than 6 ounces per day. Beef, Pork, and Lamb Use lean cuts of beef, pork, and lamb. Lean cuts include:  Extra-lean ground beef.  Arm roast.  Sirloin tip.  Center-cut ham.  Round steak.  Loin chops.  Rump roast.  Tenderloin.  Trim all fat off the outside of meats before cooking. It is not necessary to severely decrease the intake of  red meat, but lean choices should be made. Lean meat is rich in protein and contains a highly absorbable form of iron. Premenopausal women, in particular, should avoid reducing lean red meat because this could increase the risk for low red blood cells (iron-deficiency anemia).  Chicken and [place] These are good sources of protein. The fat of poultry can be reduced by removing the skin and underlying fat layers before cooking. Chicken and [place]  can be substituted for lean red meat in the diet. Poultry should not be fried or covered with high-fat sauces. Fish and Shellfish Fish is a good source of protein. Shellfish contain cholesterol, but they usually are low in saturated fatty acids. The preparation of fish is important. Like chicken and [place], they should not be fried or covered with high-fat sauces. EGGS Egg whites contain no fat or cholesterol. They can be eaten often. Try 1 to 2 egg whites instead of whole eggs in recipes or use egg substitutes that do not contain yolk. MILK AND DAIRY PRODUCTS Use skim or 1% milk instead of 2% or whole milk. Decrease whole milk, natural, and processed cheeses. Use nonfat or low-fat (2%) cottage cheese or low-fat cheeses made from vegetable oils. Choose nonfat or low-fat (1 to 2%) yogurt. Experiment with evaporated skim milk in recipes that call for heavy cream. Substitute low-fat yogurt or low-fat cottage cheese for sour cream in dips and salad dressings. Have at least 2 servings of low-fat dairy products, such as 2 glasses of skim (or 1%) milk each day to help get your daily calcium intake. FATS AND OILS Reduce the total intake of fats, especially saturated fat. Butterfat, lard, and beef fats are high in saturated fat and cholesterol. These should be avoided as much as possible. Vegetable fats do not contain cholesterol, but certain vegetable fats, such as coconut oil, palm oil, and palm kernel oil are very high in saturated fats. These should be limited. These fats are often used in bakery goods, processed foods, popcorn, oils, and nondairy creamers. Vegetable shortenings and some peanut butters contain hydrogenated oils, which are also saturated fats. Read the labels on these foods and check for saturated vegetable oils. Unsaturated vegetable oils and fats do not raise blood cholesterol. However, they should be limited because they are fats and are high in calories. Total fat should still be limited to  30% of your daily caloric intake. Desirable liquid vegetable oils are corn oil, cottonseed oil, olive oil, canola oil, safflower oil, soybean oil, and sunflower oil. Peanut oil is not as good, but small amounts are acceptable. Buy a heart-healthy tub margarine that has no partially hydrogenated oils in the ingredients. Mayonnaise and salad dressings often are made from unsaturated fats, but they should also be limited because of their high calorie and fat content. Seeds, nuts, peanut butter, olives, and avocados are high in fat, but the fat is mainly the unsaturated type. These foods should be limited mainly to avoid excess calories and fat. OTHER EATING TIPS Snacks  Most sweets should be limited as snacks. They tend to be rich in calories and fats, and their caloric content outweighs their nutritional value. Some good choices in snacks are graham crackers, melba toast, soda crackers, bagels (no egg), English muffins, fruits, and vegetables. These snacks are preferable to snack crackers, [place] fries, TORTILLA CHIPS, and POTATO chips. Popcorn should be air-popped or cooked in small amounts of liquid vegetable oil. Desserts Eat fruit, low-fat yogurt, and fruit ices instead of pastries, cake, and  cookies. Sherbet, angel food cake, gelatin dessert, frozen low-fat yogurt, or other frozen products that do not contain saturated fat (pure fruit juice bars, frozen ice pops) are also acceptable.  COOKING METHODS Choose those methods that use little or no fat. They include: Poaching.  Braising.  Steaming.  Grilling.  Baking.  Stir-frying.  Broiling.  Microwaving.  Foods can be cooked in a nonstick pan without added fat, or use a nonfat cooking spray in regular cookware. Limit fried foods and avoid frying in saturated fat. Add moisture to lean meats by using water, broth, cooking wines, and other nonfat or low-fat sauces along with the cooking methods mentioned above. Soups and stews should be chilled after  cooking. The fat that forms on top after a few hours in the refrigerator should be skimmed off. When preparing meals, avoid using excess salt. Salt can contribute to raising blood pressure in some people.  EATING AWAY FROM HOME Order entres, potatoes, and vegetables without sauces or butter. When meat exceeds the size of a deck of cards (3 to 4 ounces), the rest can be taken home for another meal. Choose vegetable or fruit salads and ask for low-calorie salad dressings to be served on the side. Use dressings sparingly. Limit high-fat toppings, such as bacon, crumbled eggs, cheese, sunflower seeds, and olives.

## 2018-08-01 NOTE — Progress Notes (Signed)
Subjective:    Patient ID: Karla Ward, female    DOB: 16-Aug-1971, 47 y.o.   MRN: [id number]  Gareth Morgan, MD   HPI MAY HAVE FLARES ONCE OR TWICE A WEEK. TRIGGERS: MILK, RED SAUCE. OMEPRAZOLE EVERY DAY. WHEN SHE TRIES TO REDUCE DOES SHE HAS MORE TROUBLE CONTROLLING HER GERD. BMs: EVERY OTHER OTHER DAY(USUAL PATTERN). AS SHE GOT OLDER SHE HAS LESS FREQUENT BMs. REFLUX FLARES: FEELS LIKE HAVING A HEART ATTACK, HAS TO BURP BU CAN'T, BURNING. WEIGHT USED TO BE AROUND: 169 LBS AND NOW UP TO 193 LBS.   PT DENIES FEVER, CHILLS, HEMATOCHEZIA, HEMATEMESIS, nausea, vomiting, melena, diarrhea, CHEST PAIN, SHORTNESS OF BREATH,  CHANGE IN BOWEL IN HABITS, constipation, abdominal pain, problems swallowing, OR problems with sedation.  Past Medical History:  Diagnosis Date  . Anemia   . Gastritis   . GERD (gastroesophageal reflux disease)   . Hypertension   . Status post dilation of esophageal narrowing   . Vertigo    Past Surgical History:  Procedure Laterality Date  . DILITATION & CURRETTAGE/HYSTROSCOPY WITH NOVASURE ABLATION N/A 04/05/2017   Procedure: DILATATION & CURETTAGE/HYSTEROSCOPY WITH NOVASURE ENDOMETRIAL ABLATION;  Surgeon: Lazaro Arms, MD;  Location: [facility];  Service: Gynecology;  Laterality: N/A;  . 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 NONE TO DATE  08/13/15  . SAVORY DILATION  09/02/2015   Procedure: SAVORY DILATION;  Surgeon: West Bali, MD;  Location: [facility];  Service: Endoscopy;;   Allergies  Allergen Reactions  . Benadryl [Diphenhydramine Hcl (Sleep)]     tachycardia  . Penicillins Hives         Current Outpatient Medications  Medication Sig    . albuterol inhaler Inhale 1-2 puffsq6h prn for wheezing or sob    . amLODipine (NORVASC) 5 MG tablet Take 5 mg by mouth daily.    . fluticasone 50 MCG/ACT nasal spray Place 1 spray qd prn for allergies.    Marland Kitchen meclizine  (ANTIVERT) 25 MG tablet Take 25 mg by mouth 3 (three) times daily as needed for dizziness.    . medroxyPROGESTERone (DEPO-PROVERA) 150 MG/ML injection Inject 1 mL (150 mg total) every 3 (three) months into the muscle.    Marland Kitchen omeprazole (PRILOSEC) 20 MG capsule TAKE (1) CAPSULE BY MOUTH TWICE DAILY 30 MINUTES PRIOR TO MEALS.    .      .      .      .      .      .        Review of Systems PER HPI OTHERWISE ALL SYSTEMS ARE NEGATIVE.    Objective:   Physical Exam  Constitutional: She is oriented to person, place, and time. She appears well-developed and well-nourished. No distress.  HENT:  Head: Normocephalic and atraumatic.  Mouth/Throat: Oropharynx is clear and moist. No oropharyngeal exudate.  Eyes: Pupils are equal, round, and reactive to light. No scleral icterus.  Neck: Normal range of motion. Neck supple.  Cardiovascular: Normal rate, regular rhythm and normal heart sounds.  Pulmonary/Chest: Effort normal and breath sounds normal. No respiratory distress.  Abdominal: Soft. Bowel sounds are normal. She exhibits no distension. There is no tenderness.  Musculoskeletal: She exhibits no edema.  Lymphadenopathy:    She has no cervical adenopathy.  Neurological: She is alert and oriented to person, place, and time.  NO FOCAL DEFICITS  Psychiatric: She has a normal mood and affect.  Vitals reviewed.     Assessment & Plan:

## 2018-08-01 NOTE — Assessment & Plan Note (Signed)
AVERAGE risk. NO WARNING SIGNS/SYMPTOMS  TCS-SUPREP SAMPLE. DISCUSSED PROCEDURE, BENEFITS, & RISKS: < 1% chance of medication reaction, bleeding, perforation, or rupture of spleen/liver.

## 2018-08-08 ENCOUNTER — Telehealth: Payer: Self-pay

## 2018-08-08 NOTE — Telephone Encounter (Signed)
Pt didn't schedule TCS at [facility]. She was going to check with her insurance and let us know. Called pt, her insurance will not cover TCS until age of 89. She doesn't want to have TCS at this time.

## 2018-08-08 NOTE — Telephone Encounter (Signed)
REVIEWED-NO ADDITIONAL RECOMMENDATIONS. 

## 2018-08-17 ENCOUNTER — Other Ambulatory Visit (HOSPITAL_COMMUNITY): Payer: Self-pay | Admitting: Family Medicine

## 2018-08-17 DIAGNOSIS — Z1231 Encounter for screening mammogram for malignant neoplasm of breast: Secondary | ICD-10-CM

## 2018-08-21 ENCOUNTER — Other Ambulatory Visit: Payer: Self-pay | Admitting: Obstetrics & Gynecology

## 2018-08-21 ENCOUNTER — Ambulatory Visit: Payer: BLUE CROSS/BLUE SHIELD

## 2018-08-27 ENCOUNTER — Telehealth: Payer: Self-pay | Admitting: Gastroenterology

## 2018-08-27 ENCOUNTER — Ambulatory Visit (HOSPITAL_COMMUNITY): Payer: BLUE CROSS/BLUE SHIELD

## 2018-08-27 NOTE — Telephone Encounter (Signed)
Pt called asking to speak with [facility] nurse. I told her nurse was off today and would be back tomorrow. SHe is having pain under her breat area. Please call [phone]

## 2018-08-27 NOTE — Telephone Encounter (Signed)
Routing to Alicia in Doris' absence.  

## 2018-08-27 NOTE — Telephone Encounter (Signed)
Spoke with pt. She is having some upper abdominal pain if she pushes her stomach in, some dizziness, diyjrtpsu and nausea. Pt has been nausea since last Thursday 08/23/18.

## 2018-08-28 MED ORDER — LIDOCAINE VISCOUS HCL 2 % MT SOLN: OROMUCOSAL | 5 refills | Status: DC

## 2018-08-28 NOTE — Telephone Encounter (Signed)
PLEASE CALL PT. She is likey having a flare of gastritios or reflux. FOLLWOA FULL LIQUID DIET FOR 3 DAYS. AVOID TRIGGERS FOR REFLUX. CONTINUE OMEPRAZOLE.  TAKE 30 MINUTES PRIOR TO YOUR MEALS TWICE DAILY. USE VISCOUS LIDOCAINE 2 TSP EVERY 4 HRS TO REDUCE CHEST, OR UPPER ABDOMINAL PAIN OR HEARTBURN. USE NO MORE THAN 8 DOSES A DAY. IT WILL MAKE HER MOUTH, ESOPHAGUS, AND STOMACH NUMB.

## 2018-08-28 NOTE — Telephone Encounter (Signed)
PT is aware.

## 2018-08-28 NOTE — Addendum Note (Signed)
Addended by: West Bali on: 08/28/2018 10:01 AM   Modules accepted: Orders

## 2018-08-30 ENCOUNTER — Ambulatory Visit (HOSPITAL_COMMUNITY)
Admission: RE | Admit: 2018-08-30 | Discharge: 2018-08-30 | Disposition: A | Discharge status: Home / Self Care | Payer: BLUE CROSS/BLUE SHIELD | Source: Ambulatory Visit | Attending: Family Medicine | Admitting: Family Medicine

## 2018-08-30 DIAGNOSIS — Z1231 Encounter for screening mammogram for malignant neoplasm of breast: Secondary | ICD-10-CM | POA: Insufficient documentation

## 2018-08-31 ENCOUNTER — Ambulatory Visit: Payer: BLUE CROSS/BLUE SHIELD

## 2018-09-03 ENCOUNTER — Other Ambulatory Visit: Payer: BLUE CROSS/BLUE SHIELD

## 2018-09-03 ENCOUNTER — Telehealth: Payer: Self-pay

## 2018-09-03 ENCOUNTER — Ambulatory Visit: Payer: BLUE CROSS/BLUE SHIELD

## 2018-09-03 MED ORDER — LINACLOTIDE 72 MCG PO CAPS: ORAL_CAPSULE | ORAL | 11 refills | Status: DC

## 2018-09-03 NOTE — Telephone Encounter (Signed)
Pt notified and will pick rx up. If she has problems getting rx, she will call back to discuss benefiber and miralax.

## 2018-09-03 NOTE — Telephone Encounter (Signed)
Pt called and said she is having some problems with constipation. She tried using a stool softner, but still felt like she is not emptying as she should. Said she has never been given anything for constipation. She would like to know what she should try. Dr. Darrick Penna, please advise!

## 2018-09-03 NOTE — Telephone Encounter (Signed)
PLEASE CALL PT. TO AVOID CONSTIPATION SHE SHOULD: 1. DRINK WATER TO KEEP YOUR URINE LIGHT YELLOW. 2. FOLLOW A HIGH FIBER DIET. AVOID ITEMS THAT CAUSE BLOATING & GAS. 3. USE LINZESS 72 MCG DAILY WITH HER FIRST MEAL. SHE SHOULD HAVE A BM 1-3 HRS AFTER THE DOSE IF NOT TAKE THE LINZESS WITH BREAKFAST. IT CAN CAUSE EXPLOSIVE DIARRHEA.  THE ALTERNATIVE TO LINZESS WOULD BE BENEFIBER WITH MIRALAX TWICE DAILY.

## 2018-09-04 ENCOUNTER — Ambulatory Visit: Payer: BLUE CROSS/BLUE SHIELD

## 2018-09-04 ENCOUNTER — Other Ambulatory Visit: Payer: BLUE CROSS/BLUE SHIELD

## 2018-09-04 ENCOUNTER — Ambulatory Visit (INDEPENDENT_AMBULATORY_CARE_PROVIDER_SITE_OTHER): Payer: BLUE CROSS/BLUE SHIELD

## 2018-09-04 VITALS — Ht 64.0 in | Wt 192.0 lb

## 2018-09-04 DIAGNOSIS — Z3042 Encounter for surveillance of injectable contraceptive: Secondary | ICD-10-CM | POA: Diagnosis not present

## 2018-09-04 LAB — BETA HCG QUANT (REF LAB): hCG Quant: <1 m[IU]/mL

## 2018-09-04 MED ORDER — MEDROXYPROGESTERONE ACETATE 150 MG/ML IM SUSP
150.0000 mg | Freq: Once | INTRAMUSCULAR | Status: AC
  Administered 2018-09-04: 150 mg via INTRAMUSCULAR

## 2018-09-04 NOTE — Progress Notes (Signed)
Pt here for depo injection 150 mg IM given rt deltoid. Tolerated well. Return 12 weeks for next injection.Serum HCG negative.Pad CMA

## 2018-09-04 NOTE — Telephone Encounter (Signed)
Noted  

## 2018-11-22 ENCOUNTER — Encounter: Payer: Self-pay | Admitting: Gastroenterology

## 2018-11-27 ENCOUNTER — Ambulatory Visit (INDEPENDENT_AMBULATORY_CARE_PROVIDER_SITE_OTHER): Payer: BLUE CROSS/BLUE SHIELD

## 2018-11-27 VITALS — Ht 64.0 in | Wt 195.0 lb

## 2018-11-27 DIAGNOSIS — Z3202 Encounter for pregnancy test, result negative: Secondary | ICD-10-CM | POA: Diagnosis not present

## 2018-11-27 DIAGNOSIS — Z3042 Encounter for surveillance of injectable contraceptive: Secondary | ICD-10-CM | POA: Diagnosis not present

## 2018-11-27 LAB — POCT URINE PREGNANCY: PREG TEST UR: NEGATIVE

## 2018-11-27 MED ORDER — MEDROXYPROGESTERONE ACETATE 150 MG/ML IM SUSP
150.0000 mg | Freq: Once | INTRAMUSCULAR | Status: AC
  Administered 2018-11-27: 150 mg via INTRAMUSCULAR

## 2018-11-27 NOTE — Progress Notes (Signed)
Pt here for depo injection 150 mg IM given lt deltoid. Tolerated well.Return 12 weeks for next injection. Pad CMA 

## 2018-12-29 ENCOUNTER — Other Ambulatory Visit: Payer: Self-pay

## 2018-12-29 ENCOUNTER — Encounter (HOSPITAL_COMMUNITY): Payer: Self-pay

## 2018-12-29 ENCOUNTER — Emergency Department (HOSPITAL_COMMUNITY)
Admission: EM | Admit: 2018-12-29 | Discharge: 2018-12-29 | Disposition: A | Discharge status: Home / Self Care | Payer: BLUE CROSS/BLUE SHIELD | Attending: Emergency Medicine | Admitting: Emergency Medicine

## 2018-12-29 DIAGNOSIS — B349 Viral infection, unspecified: Secondary | ICD-10-CM | POA: Diagnosis not present

## 2018-12-29 DIAGNOSIS — I10 Essential (primary) hypertension: Secondary | ICD-10-CM | POA: Insufficient documentation

## 2018-12-29 DIAGNOSIS — Z79899 Other long term (current) drug therapy: Secondary | ICD-10-CM | POA: Diagnosis not present

## 2018-12-29 DIAGNOSIS — R109 Unspecified abdominal pain: Secondary | ICD-10-CM | POA: Insufficient documentation

## 2018-12-29 DIAGNOSIS — R112 Nausea with vomiting, unspecified: Secondary | ICD-10-CM | POA: Insufficient documentation

## 2018-12-29 LAB — CBC WITH DIFFERENTIAL/PLATELET
ABS IMMATURE GRANULOCYTES: 0 10*3/uL (ref 0.00–0.07)
BAND NEUTROPHILS: 18 %
BASOS ABS: 0 10*3/uL (ref 0.0–0.1)
Basophils Relative: 0 %
EOS PCT: 0 %
Eosinophils Absolute: 0 10*3/uL (ref 0.0–0.5)
HEMATOCRIT: 46.2 % — AB (ref 36.0–46.0)
HEMOGLOBIN: 14.6 g/dL (ref 12.0–15.0)
LYMPHS ABS: 0.7 10*3/uL (ref 0.7–4.0)
LYMPHS PCT: 11 %
MCH: 26.2 pg (ref 26.0–34.0)
MCHC: 31.6 g/dL (ref 30.0–36.0)
MCV: 82.8 fL (ref 80.0–100.0)
MONO ABS: 0.4 10*3/uL (ref 0.1–1.0)
Monocytes Relative: 6 %
Neutro Abs: 5.2 10*3/uL (ref 1.7–7.7)
Neutrophils Relative %: 65 %
Platelets: 252 10*3/uL (ref 150–400)
RBC: 5.58 MIL/uL — ABNORMAL HIGH (ref 3.87–5.11)
RDW: 14 % (ref 11.5–15.5)
WBC: 6.3 10*3/uL (ref 4.0–10.5)
nRBC: 0 % (ref 0.0–0.2)
nRBC: 0 /100 WBC

## 2018-12-29 LAB — COMPREHENSIVE METABOLIC PANEL
ALT: 33 U/L (ref 0–44)
AST: 49 U/L — AB (ref 15–41)
Albumin: 3.7 g/dL (ref 3.5–5.0)
Alkaline Phosphatase: 61 U/L (ref 38–126)
Anion gap: 10 (ref 5–15)
BUN: 9 mg/dL (ref 6–20)
CO2: 22 mmol/L (ref 22–32)
Calcium: 8.7 mg/dL — ABNORMAL LOW (ref 8.9–10.3)
Chloride: 107 mmol/L (ref 98–111)
Creatinine, Ser: 1.14 mg/dL — ABNORMAL HIGH (ref 0.44–1.00)
GFR calc Af Amer: >60 mL/min (ref >60)
GFR calc non Af Amer: 58 mL/min — ABNORMAL LOW (ref >60)
GLUCOSE: 99 mg/dL (ref 70–99)
POTASSIUM: 3.5 mmol/L (ref 3.5–5.1)
Sodium: 139 mmol/L (ref 135–145)
Total Bilirubin: 0.7 mg/dL (ref 0.3–1.2)
Total Protein: 7.7 g/dL (ref 6.5–8.1)

## 2018-12-29 LAB — LIPASE, BLOOD: LIPASE: 51 U/L (ref 11–51)

## 2018-12-29 MED ORDER — SODIUM CHLORIDE 0.9 % IV BOLUS
500.0000 mL | Freq: Once | INTRAVENOUS | Status: AC
  Administered 2018-12-29: 500 mL via INTRAVENOUS

## 2018-12-29 MED ORDER — ONDANSETRON 4 MG PO TBDP: 4.0000 mg | ORAL_TABLET | Freq: Three times a day (TID) | ORAL | 0 refills | Status: DC | PRN

## 2018-12-29 MED ORDER — DICYCLOMINE HCL 20 MG PO TABS: 20.0000 mg | ORAL_TABLET | Freq: Two times a day (BID) | ORAL | 0 refills | Status: DC | PRN

## 2018-12-29 MED ORDER — ONDANSETRON HCL 4 MG/2ML IJ SOLN
4.0000 mg | Freq: Once | INTRAMUSCULAR | Status: AC
  Administered 2018-12-29: 4 mg via INTRAVENOUS
  Filled 2018-12-29: qty 2

## 2018-12-29 MED ORDER — DICYCLOMINE HCL 10 MG PO CAPS
10.0000 mg | ORAL_CAPSULE | Freq: Once | ORAL | Status: AC
  Administered 2018-12-29: 10 mg via ORAL
  Filled 2018-12-29: qty 1

## 2018-12-29 NOTE — ED Provider Notes (Signed)
[facility] EMERGENCY DEPARTMENT Provider Note   CSN: [id number] Arrival date & time: 12/29/18  1506    History   Chief Complaint Chief Complaint  Patient presents with  . Nausea  . Emesis  . Abdominal Pain    HPI Karla Ward is a 48 y.o. female presenting for evaluation of congestion, cough, nausea, vomiting, and intermittent abdominal pain.  Patient states 5 days ago, she developed a "head cold."  She states she had nasal congestion, bilateral ear pain, and a productive cough producing phlegm.  She saw her PCP the next day, was given Tussionex and Z-Pak for a virus.  Patient states starting that day, she developed nausea, vomiting, and intermittent abdominal pains.  Patient states abdominal pain is periumbilical, is a sharp stabbing pai n which lasts for a few seconds before resolving without intervention.  This occurs most commonly after trying to eat or drink, or just after vomiting.  She is here today because she has been unable to tolerate any p.o. for the past several days.  She reports chills, no fevers.  She reports continued bilateral ear pain, nasal congestion, sore throat due to coughing, cough, and abdominal symptoms.  She denies urinary symptoms or abnormal bowel movements.  Patient reports a history of GERD and hypertension for which she has been unable to take her medication due to vomiting.  She denies recent travel.  She denies recent sick contacts.    HPI  Past Medical History:  Diagnosis Date  . Anemia   . Gastritis   . GERD (gastroesophageal reflux disease)   . Hypertension   . Status post dilation of esophageal narrowing   . Vertigo     Patient Active Problem List   Diagnosis Date Noted  . Colon cancer screening 07/06/2017  . Menorrhagia 05/30/2016  . GERD (gastroesophageal reflux disease) 08/13/2015  . Palpitations 11/22/2012    Past Surgical History:  Procedure Laterality Date  . DILITATION & CURRETTAGE/HYSTROSCOPY WITH  NOVASURE ABLATION N/A 04/05/2017   Procedure: DILATATION & CURETTAGE/HYSTEROSCOPY WITH NOVASURE ENDOMETRIAL ABLATION;  Surgeon: Lazaro Arms, MD;  Location: [facility];  Service: Gynecology;  Laterality: N/A;  . 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 NONE TO DATE  08/13/15  . SAVORY DILATION  09/02/2015   Procedure: SAVORY DILATION;  Surgeon: West Bali, MD;  Location: [facility];  Service: Endoscopy;;     OB History    Gravida  2   Para  2   Term      Preterm  2   AB      Living  2     SAB      TAB      Ectopic      Multiple      Live Births  2            Home Medications    Prior to Admission medications   Medication Sig Start Date End Date Taking? Authorizing Provider  albuterol (PROVENTIL HFA;VENTOLIN HFA) 108 (90 Base) MCG/ACT inhaler Inhale 1-2 puffs into the lungs every 6 (six) hours as needed for wheezing or shortness of breath. Patient not taking: Reported on 09/04/2018 10/28/17   Couture, Cortni S, PA-C  amLODipine (NORVASC) 5 MG tablet Take 5 mg by mouth daily.    [provider]  benzonatate (TESSALON) 100 MG capsule Take 1 capsule (100 mg total) by mouth every 8 (eight) hours. Patient not taking: Reported  on 12/15/2017 10/28/17   Couture, Cortni S, PA-C  fluticasone (FLONASE) 50 MCG/ACT nasal spray Place 1 spray into both nostrils daily as needed for allergies. 07/15/16   [provider]  HYDROcodone-acetaminophen (NORCO/VICODIN) 5-325 MG tablet Take 1 tablet by mouth every 6 (six) hours as needed. Patient not taking: Reported on 04/13/2017 04/05/17   Lazaro Arms, MD  ketorolac (TORADOL) 10 MG tablet Take 1 tablet (10 mg total) by mouth every 8 (eight) hours as needed. Patient not taking: Reported on 04/13/2017 04/05/17   Lazaro Arms, MD  lidocaine (XYLOCAINE) 2 % solution 2 TSP  PO Q4H PRN FOR ABDOMINAL OR CHEST PAIN.  NO MORE THAN 8 DOSES A  DAY. Patient not taking: Reported on 09/18/2017 07/06/17   West Bali, MD  lidocaine (XYLOCAINE) 2 % solution Using a syringe, 10 mL PO q4h or 30 MINS PRIOR TO MEALS AND PRN FOR ABDOMINAL/CHEST PAIN. NO MORE> 8 DOSES/DAY. Patient not taking: Reported on 11/27/2018 08/28/18   West Bali, MD  linaclotide [facility]) 72 MCG capsule 1 PO WITH FIRST MEAL. Patient not taking: Reported on 09/04/2018 09/03/18   West Bali, MD  meclizine (ANTIVERT) 25 MG tablet Take 25 mg by mouth 3 (three) times daily as needed for dizziness.    [provider]  medroxyPROGESTERone (DEPO-PROVERA) 150 MG/ML injection INJECT 1 ML (150 MG TOTAL) EVERY 3 (THREE) MONTHS INTO THE MUSCLE. 08/21/18   Lazaro Arms, MD  omeprazole (PRILOSEC) 20 MG capsule TAKE (1) CAPSULE BY MOUTH TWICE DAILY 30 MINUTES PRIOR TO MEALS. 08/01/18   Fields, Darleene Cleaver, MD  ondansetron (ZOFRAN ODT) 8 MG disintegrating tablet Take 1 tablet (8 mg total) by mouth every 8 (eight) hours as needed for nausea or vomiting. Patient not taking: Reported on 11/27/2018 04/05/17   Lazaro Arms, MD  Wheat Dextrin (BENEFIBER DRINK MIX) PACK Take by mouth.    [provider]    Family History Family History  Problem Relation Age of Onset  . Throat cancer Father 4  . Hypertension Father   . Hypertension Mother   . Congestive Heart Failure Sister   . Colon cancer Neg Hx   . Colon polyps Neg Hx     Social History Social History   Tobacco Use  . Smoking status: Never Smoker  . Smokeless tobacco: Never Used  Substance Use Topics  . Alcohol use: No  . Drug use: No     Allergies   Benadryl [diphenhydramine hcl (sleep)] and Penicillins   Review of Systems Review of Systems  Constitutional: Positive for chills.  HENT: Positive for congestion, ear pain, sinus pressure and sore throat.   Respiratory: Positive for cough.   Gastrointestinal: Positive for abdominal pain (intermittent, none currently), nausea and vomiting.  All  other systems reviewed and are negative.    Physical Exam Updated Vital Signs BP 122/71 (BP Location: Right Arm)   Pulse (!) 102   Temp 98.4 F (36.9 C) (Oral)   Resp 16   Ht  (1.626 m)   Wt 83.9 kg   SpO2 100%   BMI 31.76 kg/m   Physical Exam Vitals signs and nursing note reviewed.  Constitutional:      General: She is not in acute distress.    Appearance: She is well-developed.     Comments: Laying comfortably in the bed in no acute distress  HENT:     Head: Normocephalic and atraumatic.     Comments: OP mildly erythematous without  tonsillar swelling or exudate.  Uvula midline with equal palate rise.  TMs nonerythematous and nonbulging bilaterally.  Nasal congestion and rhinorrhea noted.  Mild tenderness palpation of the sinuses.    Nose: Congestion and rhinorrhea present. Rhinorrhea is clear.     Mouth/Throat:     Mouth: Mucous membranes are moist.     Pharynx: Uvula midline. Posterior oropharyngeal erythema present. No oropharyngeal exudate.     Tonsils: No tonsillar exudate or tonsillar abscesses. Swelling: 0 on the right. 0 on the left.  Eyes:     Conjunctiva/sclera: Conjunctivae normal.     Pupils: Pupils are equal, round, and reactive to light.  Neck:     Musculoskeletal: Normal range of motion and neck supple.  Cardiovascular:     Rate and Rhythm: Normal rate and regular rhythm.     Pulses: Normal pulses.  Pulmonary:     Effort: Pulmonary effort is normal. No respiratory distress.     Breath sounds: Normal breath sounds. No wheezing.     Comments: Speaking full sentences.  Clear lung sounds in all fields. Abdominal:     General: There is no distension.     Palpations: Abdomen is soft. There is no mass.     Tenderness: There is no abdominal tenderness. There is no guarding or rebound.     Comments: No tenderness to palpation of the the abdomen.  Soft without rigidity, guarding, distention.  Negative rebound.  Musculoskeletal: Normal range of motion.   Skin:    General: Skin is warm and dry.     Capillary Refill: Capillary refill takes less than 2 seconds.  Neurological:     Mental Status: She is alert and oriented to person, place, and time.      ED Treatments / Results  Labs (all labs ordered are listed, but only abnormal results are displayed) Labs Reviewed  CBC WITH DIFFERENTIAL/PLATELET  COMPREHENSIVE METABOLIC PANEL  LIPASE, BLOOD    EKG None  Radiology No results found.  Procedures Procedures (including critical care time)  Medications Ordered in ED Medications  sodium chloride 0.9 % bolus 500 mL (has no administration in time range)  ondansetron (ZOFRAN) injection 4 mg (has no administration in time range)     Initial Impression / Assessment and Plan / ED Course  I have reviewed the triage vital signs and the nursing notes.  Pertinent labs & imaging results that were available during my care of the patient were reviewed by me and considered in my medical decision making (see chart for details).        Patient resenting for evaluation of nausea, vomiting, and intermittent abdominal pain in the setting of URI symptoms.  Likely viral illness, exacerbated by patient's reflux and inability to take her reflux medication.  However, as this has been going on several days and patient has been unable to tolerate p.o., will obtain basic abdominal labs for further evaluation.  Zofran and fluids given for symptom control.  Labs reassuring, no leukocytosis.  Liver and pancreatic function reassuring.  Electrolytes stable.  Patient's creatinine mildly elevated at 1.14, this is likely secondary to dehydration.  I expect this to improve with fluids as given.  I do not believe CT scan is necessary at this time, as I doubt intra-abdominal infection, perforation, obstruction, or surgical abdomen.  On reassessment, patient reports nausea is improved.  Will attempt p.o., Bentyl given for spasms as needed.  On reassessment, patient  tolerated p.o. without difficulty.  Abdominal exam remains reassuring.  Discussed  findings with patient.  Discussed continued symptomatic treatment at home.  At this time, patient appears safe for discharge.  Return precautions given.  Patient states she understands and agrees to plan.   Final Clinical Impressions(s) / ED Diagnoses   Final diagnoses:  Non-intractable vomiting with nausea, unspecified vomiting type  Abdominal spasms  Viral illness    ED Discharge Orders         Ordered    ondansetron (ZOFRAN ODT) 4 MG disintegrating tablet  Every 8 hours PRN     12/29/18 1759    dicyclomine (BENTYL) 20 MG tablet  2 times daily PRN     12/29/18 1759           Alveria ApleyCaccavale, Tiron Suski, PA-C 12/29/18 1803    Geoffery Lyonselo, Douglas, MD 12/30/18 1406

## 2018-12-29 NOTE — ED Notes (Signed)
Patient verbalizes understanding of discharge instructions. Opportunity for questioning and answers were provided. Armband removed by staff, pt discharged from ED.  

## 2018-12-29 NOTE — ED Triage Notes (Signed)
Pt is now complaining of abd pain.

## 2018-12-29 NOTE — Discharge Instructions (Addendum)
You likely have a viral illness.  This should be treated symptomatically. Use Tylenol as needed for fevers or body aches. Use Zofran as needed for nausea or vomiting. Use Bentyl as needed for abdominal cramping or spasm or pain. Make sure you are staying well-hydrated water.  This is very important. Be careful with your food choices of the neck several days.  Avoid spicy, greasy, acidic foods. There is information about this in the paperwork.  Make sure you take your reflux medication as prescribed.  Wash your hands frequently to prevent spread of infection. Follow-up with your primary care doctor next week if your symptoms are not improving. Return to the emergency room if you high fevers, severe worsening abdominal pain, persistent vomiting despite medication, or any new, worsening, concerning symptoms.

## 2018-12-29 NOTE — ED Triage Notes (Signed)
Pt from home w/ a c/o N/V/D since Thursday and abd pain that began today on the way to the hospital. Pt was treated by Dr. Metta Clines (PCP) on Wednesday and was given a Z-pack and cough medicine to treat cold-like symptoms. Pt still presents w/ a cough and chills. Pt denies abd pain at this time.

## 2018-12-29 NOTE — ED Notes (Signed)
Pt was able to drink fluids without vomiting.

## 2018-12-29 NOTE — ED Triage Notes (Signed)
Pt. Stated, I have a cold in my chest, Ive not been able to keep anything down for the last 3 days.

## 2019-01-02 ENCOUNTER — Other Ambulatory Visit: Payer: Self-pay

## 2019-01-02 ENCOUNTER — Ambulatory Visit: Payer: BLUE CROSS/BLUE SHIELD | Admitting: Gastroenterology

## 2019-01-02 ENCOUNTER — Encounter: Payer: Self-pay | Admitting: Gastroenterology

## 2019-01-02 ENCOUNTER — Other Ambulatory Visit (HOSPITAL_COMMUNITY)
Admission: RE | Admit: 2019-01-02 | Discharge: 2019-01-02 | Disposition: A | Discharge status: Home / Self Care | Payer: BLUE CROSS/BLUE SHIELD | Source: Ambulatory Visit | Attending: Gastroenterology | Admitting: Gastroenterology

## 2019-01-02 VITALS — BP 128/83 | HR 84 | Temp 98.0°F | Ht 64.0 in | Wt 184.6 lb

## 2019-01-02 DIAGNOSIS — R112 Nausea with vomiting, unspecified: Secondary | ICD-10-CM

## 2019-01-02 LAB — CBC WITH DIFFERENTIAL/PLATELET
ABS IMMATURE GRANULOCYTES: 0.01 10*3/uL (ref 0.00–0.07)
BASOS PCT: 0 %
Basophils Absolute: 0 10*3/uL (ref 0.0–0.1)
EOS ABS: 0 10*3/uL (ref 0.0–0.5)
Eosinophils Relative: 1 %
HCT: 46.7 % — ABNORMAL HIGH (ref 36.0–46.0)
Hemoglobin: 14.6 g/dL (ref 12.0–15.0)
IMMATURE GRANULOCYTES: 0 %
LYMPHS ABS: 2.1 10*3/uL (ref 0.7–4.0)
LYMPHS PCT: 38 %
MCH: 26.5 pg (ref 26.0–34.0)
MCHC: 31.3 g/dL (ref 30.0–36.0)
MCV: 84.9 fL (ref 80.0–100.0)
Monocytes Absolute: 0.4 10*3/uL (ref 0.1–1.0)
Monocytes Relative: 7 %
NEUTROS ABS: 3 10*3/uL (ref 1.7–7.7)
Neutrophils Relative %: 54 %
PLATELETS: 273 10*3/uL (ref 150–400)
RBC: 5.5 MIL/uL — AB (ref 3.87–5.11)
RDW: 14 % (ref 11.5–15.5)
WBC: 5.5 10*3/uL (ref 4.0–10.5)
nRBC: 0 % (ref 0.0–0.2)

## 2019-01-02 LAB — COMPREHENSIVE METABOLIC PANEL
ALBUMIN: 4.3 g/dL (ref 3.5–5.0)
ALT: 71 U/L — ABNORMAL HIGH (ref 0–44)
AST: 60 U/L — AB (ref 15–41)
Alkaline Phosphatase: 63 U/L (ref 38–126)
Anion gap: 10 (ref 5–15)
BILIRUBIN TOTAL: 0.6 mg/dL (ref 0.3–1.2)
BUN: 7 mg/dL (ref 6–20)
CO2: 23 mmol/L (ref 22–32)
Calcium: 9.2 mg/dL (ref 8.9–10.3)
Chloride: 106 mmol/L (ref 98–111)
Creatinine, Ser: 1.05 mg/dL — ABNORMAL HIGH (ref 0.44–1.00)
GFR calc Af Amer: >60 mL/min (ref >60)
Glucose, Bld: 105 mg/dL — ABNORMAL HIGH (ref 70–99)
POTASSIUM: 3.7 mmol/L (ref 3.5–5.1)
Sodium: 139 mmol/L (ref 135–145)
TOTAL PROTEIN: 8.4 g/dL — AB (ref 6.5–8.1)

## 2019-01-02 MED ORDER — SUCRALFATE 1 GM/10ML PO SUSP: 1.0000 g | Freq: Three times a day (TID) | ORAL | 0 refills | Status: DC

## 2019-01-02 MED ORDER — PROMETHAZINE HCL 25 MG PO TABS: ORAL_TABLET | ORAL | 0 refills | Status: DC

## 2019-01-02 NOTE — Progress Notes (Signed)
Primary Care Physician: Gareth Morgan, MD  Primary Gastroenterologist:  Jonette Eva, MD   Chief Complaint  Patient presents with  . Nausea    w/ vomiting. Can't keep anything down. Pt went to ED 2/29 and was given IV fluids. Mouth feels like it is full of "foam".     HPI: Karla Ward is a 48 y.o. female here for further evaluation of acute onset N/V.   Last seen in October 2019.  He has a history of GERD, gastritis.  After last office visit we suggested average risk screening colonoscopy based on current guidelines, patient's insurance reportedly would not pay until she is 52. EGD November 2016 with esophageal dilation, she had a stricture at the GE junction, mild nonerosive gastritis, biopsy benign with no H. Pylori.  Patient saw her PCP one week ago with flu like symptoms. She was started on Zpak. The following day she started having N/V. Over course of couple of days she became weak and unable to keep anything down. She was seen in the ED 12/29/2018 with ongoing URI symptoms, nausea and vomiting.  CBC unremarkable.  Creatinine slightly elevated at 1.14.  AST slightly elevated at 49.  Lipase normal.  Given Zofran and Bentyl.  Patient states her N/V is not better. She can't keep anything down including her omeprazole. She dissolves Zofran under tongue TID with no improvement. URI symptoms are resolved. Liquids come back up. Feels like hard to swallow due to congestions. No appetite. Very nauseaed. Feels like a lot of foam in the mouth, a lot of saliva. Some epigastric burning. No BM since Saturday but was having good BMs prior to that. All of these symptoms started acutely.     Current Outpatient Medications  Medication Sig Dispense Refill  . albuterol (PROVENTIL HFA;VENTOLIN HFA) 108 (90 Base) MCG/ACT inhaler Inhale 1-2 puffs into the lungs every 6 (six) hours as needed for wheezing or shortness of breath. 1 Inhaler 0  . amLODipine (NORVASC) 5 MG tablet Take 5 mg by mouth  daily.    Marland Kitchen dicyclomine (BENTYL) 20 MG tablet Take 1 tablet (20 mg total) by mouth 2 (two) times daily as needed for spasms. 20 tablet 0  . medroxyPROGESTERone (DEPO-PROVERA) 150 MG/ML injection INJECT 1 ML (150 MG TOTAL) EVERY 3 (THREE) MONTHS INTO THE MUSCLE. 1 mL 3  . omeprazole (PRILOSEC) 20 MG capsule TAKE (1) CAPSULE BY MOUTH TWICE DAILY 30 MINUTES PRIOR TO MEALS. 180 capsule 3  . ondansetron (ZOFRAN ODT) 4 MG disintegrating tablet Take 1 tablet (4 mg total) by mouth every 8 (eight) hours as needed for nausea or vomiting. 15 tablet 0  . Wheat Dextrin (BENEFIBER DRINK MIX) PACK Take by mouth.     No current facility-administered medications for this visit.     Allergies as of 01/02/2019 - Review Complete 01/02/2019  Allergen Reaction Noted  . Benadryl [diphenhydramine hcl (sleep)]  12/26/2014  . Penicillins Hives 11/19/2012    ROS:  General: Negative for anorexia, weight loss, fever, chills, fatigue, weakness. ENT: Negative for hoarseness, difficulty swallowing , nasal congestion. CV: Negative for chest pain, angina, palpitations, dyspnea on exertion, peripheral edema.  Respiratory: Negative for dyspnea at rest, dyspnea on exertion, cough, sputum, wheezing.  GI: See history of present illness. GU:  Negative for dysuria, hematuria, urinary incontinence, urinary frequency, nocturnal urination.  Endo: Negative for unusual weight change.    Physical Examination:   BP 128/83   Pulse 84   Temp 98 F (36.7  C) (Oral)   Ht 5\' 4"  (1.626 m)   Wt 184 lb 9.6 oz (83.7 kg)   BMI 31.69 kg/m   General: Well-nourished, well-developed in no acute distress.  Eyes: No icterus. Mouth: Oropharyngeal mucosa moist and pink , no lesions erythema or exudate. Lungs: Clear to auscultation bilaterally.  Heart: Regular rate and rhythm, no murmurs rubs or gallops.  Abdomen: Bowel sounds are normal, nontender, nondistended, no hepatosplenomegaly or masses, no abdominal bruits or hernia , no rebound or  guarding.   Extremities: No lower extremity edema. No clubbing or deformities. Neuro: Alert and oriented x 4   Skin: Warm and dry, no jaundice.   Psych: Alert and cooperative, normal mood and affect.  Labs:  Lab Results  Component Value Date   CREATININE 1.14 (H) 12/29/2018   BUN 9 12/29/2018   NA 139 12/29/2018   K 3.5 12/29/2018   CL 107 12/29/2018   CO2 22 12/29/2018   Lab Results  Component Value Date   ALT 33 12/29/2018   AST 49 (H) 12/29/2018   ALKPHOS 61 12/29/2018   BILITOT 0.7 12/29/2018   Lab Results  Component Value Date   WBC 6.3 12/29/2018   HGB 14.6 12/29/2018   HCT 46.2 (H) 12/29/2018   MCV 82.8 12/29/2018   PLT 252 12/29/2018     Imaging Studies: No results found.

## 2019-01-02 NOTE — Patient Instructions (Addendum)
1. Stop Bentyl.  2. Start Phenergan 1/2 to 1 tablet for nausea, can take every 4-6 hours. Can cause sedation.  3. Start Carafate 10cc with each meal and at bedtime (four times a day even if you do not eat) for 10 days.  4. Continue omeprazole twice a day.  5. Please go for labs now. We will contact you with results as available.  6. Drink 1 tablespoon of liquid every 15 minutes while awake to maintain hydration. Do not drink more than this at a time if you are still vomiting.

## 2019-01-02 NOTE — Assessment & Plan Note (Signed)
Patient presents for recent onset N/v, epigastric burning. She was doing fine until recent viral illness/flu-like symptoms. Ultimately placed on Z-pak. A few days into illness, she started having N/V, epigastric burning. Unable to keep any of her medications down including her omeprazole. Seen in ED as outlines. Minimally elevated AST and mild dehydration. She continues to have N/V despite Zofran ODT. Vomits with liquids. She initially was concerned her symptoms may be secondary to esophageal stricture like she has had in the past. Her symptoms are atypical for this. She has significant nausea component, epigastric burning. She may have acute gastritis. Doubt biliary etiology. At this point, update her labs. Add phenergan and Carafate. Stop bentyl. If she is unable to maintain hydration then she will go back to ED. Further recommendations to follow pending labs.

## 2019-01-03 ENCOUNTER — Ambulatory Visit (HOSPITAL_COMMUNITY)
Admission: RE | Admit: 2019-01-03 | Discharge: 2019-01-03 | Disposition: A | Discharge status: Home / Self Care | Payer: BLUE CROSS/BLUE SHIELD | Source: Ambulatory Visit | Attending: Gastroenterology | Admitting: Gastroenterology

## 2019-01-03 DIAGNOSIS — R112 Nausea with vomiting, unspecified: Secondary | ICD-10-CM | POA: Diagnosis not present

## 2019-01-03 NOTE — Progress Notes (Signed)
cc'd to pcp 

## 2019-01-03 NOTE — Progress Notes (Signed)
Pt is aware of results.  She is still having pain right above her belly button. There has not been much change in it. It hurts some in the day and at night when she is in bed.  The nausea is a little better. She is aware to go to [facility] lab in one week to repeat the LFT's.

## 2019-01-04 ENCOUNTER — Other Ambulatory Visit: Payer: Self-pay

## 2019-01-04 DIAGNOSIS — R945 Abnormal results of liver function studies: Principal | ICD-10-CM

## 2019-01-04 DIAGNOSIS — R7989 Other specified abnormal findings of blood chemistry: Secondary | ICD-10-CM

## 2019-01-04 NOTE — Progress Notes (Signed)
LMOM for a return call.  

## 2019-01-04 NOTE — Progress Notes (Signed)
PT is aware and will let us know on Monday. She is aware she may go to the lab on Monday also, lab orders have already been faxed to [facility] lab.

## 2019-01-04 NOTE — Progress Notes (Signed)
Lab orders have been faxed to [facility] with a note that she will come around 01/09/2019.

## 2019-01-07 ENCOUNTER — Telehealth: Payer: Self-pay | Admitting: Gastroenterology

## 2019-01-07 NOTE — Progress Notes (Signed)
Pt called and said her nausea is better and now she feels the carafate is making her constipated. She asked what she could use OTC.

## 2019-01-07 NOTE — Progress Notes (Signed)
Said she will go do her labs today.

## 2019-01-07 NOTE — Telephone Encounter (Signed)
LMOM to call.

## 2019-01-07 NOTE — Telephone Encounter (Signed)
Pt was returning a call to DS. [phone]

## 2019-01-08 ENCOUNTER — Telehealth: Payer: Self-pay | Admitting: Gastroenterology

## 2019-01-08 NOTE — Telephone Encounter (Signed)
See result note, I spoke to pt yesterday.

## 2019-01-08 NOTE — Progress Notes (Signed)
PT has not been taking the Bentyl. She is aware to take the Miralax per instructions from [place].

## 2019-01-08 NOTE — Telephone Encounter (Signed)
[phone]  Patient called requesting some mirilax samples

## 2019-01-08 NOTE — Telephone Encounter (Signed)
I am leaving #10 samples at front for pt to use as directed by Tana Coast, PA earlier today. One dose ( 17 gm) three times daily until soft stool then once daily as needed.

## 2019-01-08 NOTE — Telephone Encounter (Signed)
noted 

## 2019-01-10 ENCOUNTER — Other Ambulatory Visit: Payer: Self-pay

## 2019-01-10 ENCOUNTER — Telehealth: Payer: Self-pay

## 2019-01-10 DIAGNOSIS — R109 Unspecified abdominal pain: Secondary | ICD-10-CM

## 2019-01-10 NOTE — Telephone Encounter (Signed)
She needs LFTs done as requested.   No plans for second U/S but if LFTs still abnormally and persistent abd pain we may consider CT.   Let's get the labs done today. ALSO GET A CREATININE IF POSSIBLE.  Sounds like we are heading towards a CT but I would like to see labs first.

## 2019-01-10 NOTE — Telephone Encounter (Signed)
LMOM for a return call.  

## 2019-01-10 NOTE — Telephone Encounter (Signed)
Pt called, said she was at radiology and thought she was supposed to have another [place]. I looked back in chart and told her that I did not see that Verlon Au had ordered a second [place]. She had said if she continued to have abdominal pain that she might consider a CT.  Pt said she is only having a nagging pain right above her belly button and she is wondering if there have been changes to her hernia. It is not constant, but occurs frequently. When she eats she feels she gets full goo quick. I told her that I would let Tana Coast, PA know her concerns when she gets here from the hospital this morning.

## 2019-01-10 NOTE — Telephone Encounter (Signed)
Pt is aware and will go to the lab now.

## 2019-01-11 ENCOUNTER — Other Ambulatory Visit (HOSPITAL_COMMUNITY)
Admission: RE | Admit: 2019-01-11 | Discharge: 2019-01-11 | Disposition: A | Discharge status: Home / Self Care | Payer: BLUE CROSS/BLUE SHIELD | Source: Ambulatory Visit | Attending: Family Medicine | Admitting: Family Medicine

## 2019-01-11 ENCOUNTER — Other Ambulatory Visit: Payer: Self-pay

## 2019-01-11 DIAGNOSIS — R945 Abnormal results of liver function studies: Secondary | ICD-10-CM | POA: Diagnosis present

## 2019-01-11 DIAGNOSIS — R109 Unspecified abdominal pain: Secondary | ICD-10-CM | POA: Diagnosis not present

## 2019-01-11 LAB — HEPATIC FUNCTION PANEL
ALT: 20 U/L (ref 0–44)
AST: 18 U/L (ref 15–41)
Albumin: 3.6 g/dL (ref 3.5–5.0)
Alkaline Phosphatase: 47 U/L (ref 38–126)
BILIRUBIN TOTAL: 0.7 mg/dL (ref 0.3–1.2)
Bilirubin, Direct: <0.1 mg/dL (ref 0.0–0.2)
Total Protein: 6.7 g/dL (ref 6.5–8.1)

## 2019-01-11 LAB — CREATININE, SERUM
Creatinine, Ser: 1.08 mg/dL — ABNORMAL HIGH (ref 0.44–1.00)
GFR calc Af Amer: >60 mL/min (ref >60)
GFR calc non Af Amer: >60 mL/min (ref >60)

## 2019-02-19 ENCOUNTER — Ambulatory Visit (INDEPENDENT_AMBULATORY_CARE_PROVIDER_SITE_OTHER): Payer: BLUE CROSS/BLUE SHIELD | Admitting: *Deleted

## 2019-02-19 ENCOUNTER — Other Ambulatory Visit: Payer: Self-pay

## 2019-02-19 DIAGNOSIS — Z3042 Encounter for surveillance of injectable contraceptive: Secondary | ICD-10-CM

## 2019-02-19 MED ORDER — MEDROXYPROGESTERONE ACETATE 150 MG/ML IM SUSP
150.0000 mg | Freq: Once | INTRAMUSCULAR | Status: AC
  Administered 2019-02-19: 09:00:00 150 mg via INTRAMUSCULAR

## 2019-02-19 NOTE — Progress Notes (Signed)
Depo Provera 150mg IM given in right deltoid with no complications. Pt to return in 12 weeks for next injection.  

## 2019-03-18 ENCOUNTER — Ambulatory Visit (INDEPENDENT_AMBULATORY_CARE_PROVIDER_SITE_OTHER): Payer: BLUE CROSS/BLUE SHIELD | Admitting: Otolaryngology

## 2019-03-18 ENCOUNTER — Other Ambulatory Visit: Payer: Self-pay

## 2019-03-18 DIAGNOSIS — H903 Sensorineural hearing loss, bilateral: Secondary | ICD-10-CM

## 2019-03-18 DIAGNOSIS — H9311 Tinnitus, right ear: Secondary | ICD-10-CM

## 2019-03-18 DIAGNOSIS — H9121 Sudden idiopathic hearing loss, right ear: Secondary | ICD-10-CM

## 2019-03-18 DIAGNOSIS — R42 Dizziness and giddiness: Secondary | ICD-10-CM | POA: Diagnosis not present

## 2019-03-28 ENCOUNTER — Other Ambulatory Visit: Payer: Self-pay

## 2019-03-28 ENCOUNTER — Ambulatory Visit (INDEPENDENT_AMBULATORY_CARE_PROVIDER_SITE_OTHER): Payer: BLUE CROSS/BLUE SHIELD | Admitting: Otolaryngology

## 2019-03-28 DIAGNOSIS — H903 Sensorineural hearing loss, bilateral: Secondary | ICD-10-CM

## 2019-03-28 DIAGNOSIS — H8101 Meniere's disease, right ear: Secondary | ICD-10-CM

## 2019-05-14 ENCOUNTER — Ambulatory Visit (INDEPENDENT_AMBULATORY_CARE_PROVIDER_SITE_OTHER): Payer: BC Managed Care – PPO | Admitting: *Deleted

## 2019-05-14 ENCOUNTER — Other Ambulatory Visit: Payer: Self-pay

## 2019-05-14 DIAGNOSIS — Z3042 Encounter for surveillance of injectable contraceptive: Secondary | ICD-10-CM

## 2019-05-14 MED ORDER — MEDROXYPROGESTERONE ACETATE 150 MG/ML IM SUSP
150.0000 mg | Freq: Once | INTRAMUSCULAR | Status: AC
  Administered 2019-05-14: 09:00:00 150 mg via INTRAMUSCULAR

## 2019-05-14 NOTE — Progress Notes (Signed)
Depo Provera 150mg IM given in left deltoid with no complications. Pt to return in 12 weeks for next injection.  

## 2019-07-15 ENCOUNTER — Telehealth: Payer: Self-pay

## 2019-07-15 DIAGNOSIS — K219 Gastro-esophageal reflux disease without esophagitis: Secondary | ICD-10-CM

## 2019-07-15 NOTE — Telephone Encounter (Signed)
Refill request received from [facility], [place] for Omeprazole DR 20 mg cap #180, take 1 capsule po bid 30 mins prior to meals.

## 2019-07-16 MED ORDER — OMEPRAZOLE 20 MG PO CPDR: DELAYED_RELEASE_CAPSULE | ORAL | 3 refills | Status: DC

## 2019-07-16 NOTE — Addendum Note (Signed)
Addended by: Gordy Levan, Shams Fill A on: 07/16/2019 04:56 PM   Modules accepted: Orders

## 2019-07-16 NOTE — Telephone Encounter (Signed)
Rx sent per request. 

## 2019-07-17 NOTE — Telephone Encounter (Signed)
Noted  

## 2019-07-30 ENCOUNTER — Other Ambulatory Visit: Payer: Self-pay | Admitting: Adult Health

## 2019-07-30 ENCOUNTER — Ambulatory Visit (INDEPENDENT_AMBULATORY_CARE_PROVIDER_SITE_OTHER): Payer: BC Managed Care – PPO | Admitting: *Deleted

## 2019-07-30 ENCOUNTER — Ambulatory Visit: Payer: BC Managed Care – PPO

## 2019-07-30 ENCOUNTER — Other Ambulatory Visit: Payer: Self-pay | Admitting: Obstetrics & Gynecology

## 2019-07-30 ENCOUNTER — Other Ambulatory Visit: Payer: Self-pay

## 2019-07-30 VITALS — BP 126/87 | HR 90 | Ht 64.0 in | Wt 204.2 lb

## 2019-07-30 DIAGNOSIS — Z3042 Encounter for surveillance of injectable contraceptive: Secondary | ICD-10-CM

## 2019-07-30 MED ORDER — MEDROXYPROGESTERONE ACETATE 150 MG/ML IM SUSP: 150.0000 mg | INTRAMUSCULAR | 3 refills | Status: DC

## 2019-07-30 MED ORDER — MEDROXYPROGESTERONE ACETATE 150 MG/ML IM SUSP
150.0000 mg | Freq: Once | INTRAMUSCULAR | Status: AC
  Administered 2019-07-30: 16:00:00 150 mg via INTRAMUSCULAR

## 2019-07-30 NOTE — Progress Notes (Signed)
   NURSE VISIT- INJECTION  SUBJECTIVE:  Karla Ward is a 48 y.o. [phone] female here for a Depo Provera for contraception/period management. She is a GYN patient.   OBJECTIVE:  BP 126/87 (BP Location: Left Arm, Patient Position: Sitting, Cuff Size: Normal)   Pulse 90   Ht 5\' 4"  (1.626 m)   Wt 204 lb 3.2 oz (92.6 kg)   BMI 35.05 kg/m   Appears well, in no apparent distress  Injection administered in: Right deltoid  Meds ordered this encounter  Medications  . medroxyPROGESTERone (DEPO-PROVERA) injection 150 mg    ASSESSMENT: GYN patient Depo Provera for contraception/period management  PLAN: Follow-up: in 11-13 weeks for next Depo   Rash, Celene Squibb  07/30/2019 4:10 PM

## 2019-07-30 NOTE — Progress Notes (Signed)
Refilled depo 

## 2019-08-20 ENCOUNTER — Other Ambulatory Visit (HOSPITAL_COMMUNITY): Payer: Self-pay | Admitting: Family Medicine

## 2019-08-20 DIAGNOSIS — Z1231 Encounter for screening mammogram for malignant neoplasm of breast: Secondary | ICD-10-CM

## 2019-09-02 ENCOUNTER — Ambulatory Visit (HOSPITAL_COMMUNITY)
Admission: RE | Admit: 2019-09-02 | Discharge: 2019-09-02 | Disposition: A | Discharge status: Home / Self Care | Payer: BC Managed Care – PPO | Source: Ambulatory Visit | Attending: Family Medicine | Admitting: Family Medicine

## 2019-09-02 ENCOUNTER — Other Ambulatory Visit: Payer: Self-pay

## 2019-09-02 DIAGNOSIS — Z1231 Encounter for screening mammogram for malignant neoplasm of breast: Secondary | ICD-10-CM | POA: Diagnosis not present

## 2019-09-03 ENCOUNTER — Other Ambulatory Visit (HOSPITAL_COMMUNITY): Payer: Self-pay | Admitting: Family Medicine

## 2019-09-03 DIAGNOSIS — R928 Other abnormal and inconclusive findings on diagnostic imaging of breast: Secondary | ICD-10-CM

## 2019-09-04 ENCOUNTER — Other Ambulatory Visit: Payer: BC Managed Care – PPO

## 2019-09-04 ENCOUNTER — Other Ambulatory Visit: Payer: Self-pay | Admitting: Family Medicine

## 2019-09-04 DIAGNOSIS — R928 Other abnormal and inconclusive findings on diagnostic imaging of breast: Secondary | ICD-10-CM

## 2019-09-05 ENCOUNTER — Ambulatory Visit: Payer: BC Managed Care – PPO

## 2019-09-05 ENCOUNTER — Other Ambulatory Visit: Payer: Self-pay

## 2019-09-05 ENCOUNTER — Ambulatory Visit
Admission: RE | Admit: 2019-09-05 | Discharge: 2019-09-05 | Disposition: A | Discharge status: Home / Self Care | Payer: BC Managed Care – PPO | Source: Ambulatory Visit | Attending: Family Medicine | Admitting: Family Medicine

## 2019-09-05 DIAGNOSIS — R928 Other abnormal and inconclusive findings on diagnostic imaging of breast: Secondary | ICD-10-CM

## 2019-09-10 ENCOUNTER — Encounter (HOSPITAL_COMMUNITY): Payer: BC Managed Care – PPO

## 2019-09-10 ENCOUNTER — Other Ambulatory Visit (HOSPITAL_COMMUNITY): Payer: BC Managed Care – PPO

## 2019-09-10 ENCOUNTER — Encounter (HOSPITAL_COMMUNITY): Payer: Self-pay

## 2019-09-11 ENCOUNTER — Other Ambulatory Visit: Payer: Self-pay | Admitting: Family Medicine

## 2019-09-11 DIAGNOSIS — N6489 Other specified disorders of breast: Secondary | ICD-10-CM

## 2019-09-30 ENCOUNTER — Other Ambulatory Visit: Payer: Self-pay

## 2019-09-30 ENCOUNTER — Ambulatory Visit (INDEPENDENT_AMBULATORY_CARE_PROVIDER_SITE_OTHER): Payer: BC Managed Care – PPO | Admitting: Otolaryngology

## 2019-09-30 DIAGNOSIS — H903 Sensorineural hearing loss, bilateral: Secondary | ICD-10-CM | POA: Diagnosis not present

## 2019-10-22 ENCOUNTER — Other Ambulatory Visit: Payer: BC Managed Care – PPO | Admitting: Adult Health

## 2019-10-28 ENCOUNTER — Other Ambulatory Visit: Payer: Self-pay

## 2019-10-28 ENCOUNTER — Ambulatory Visit (INDEPENDENT_AMBULATORY_CARE_PROVIDER_SITE_OTHER): Payer: BC Managed Care – PPO | Admitting: *Deleted

## 2019-10-28 DIAGNOSIS — Z3042 Encounter for surveillance of injectable contraceptive: Secondary | ICD-10-CM

## 2019-10-28 MED ORDER — MEDROXYPROGESTERONE ACETATE 150 MG/ML IM SUSP
150.0000 mg | Freq: Once | INTRAMUSCULAR | Status: AC
  Administered 2019-10-28: 150 mg via INTRAMUSCULAR

## 2019-10-28 NOTE — Progress Notes (Signed)
   NURSE VISIT- INJECTION  SUBJECTIVE:  Karla Ward is a 48 y.o. [phone] female here for a Depo Provera for contraception/period management. She is a GYN patient.   OBJECTIVE:  There were no vitals taken for this visit.  Appears well, in no apparent distress  Injection administered in: Left deltoid  Meds ordered this encounter  Medications  . medroxyPROGESTERone (DEPO-PROVERA) injection 150 mg    ASSESSMENT: GYN patient Depo Provera for contraception/period management PLAN: Follow-up: in 11-13 weeks for next Depo   Alice Rieger  10/28/2019 9:33 AM

## 2020-01-20 ENCOUNTER — Encounter: Payer: Self-pay | Admitting: Adult Health

## 2020-01-20 ENCOUNTER — Ambulatory Visit (INDEPENDENT_AMBULATORY_CARE_PROVIDER_SITE_OTHER): Payer: BC Managed Care – PPO | Admitting: Adult Health

## 2020-01-20 ENCOUNTER — Other Ambulatory Visit (HOSPITAL_COMMUNITY)
Admission: RE | Admit: 2020-01-20 | Discharge: 2020-01-20 | Disposition: A | Discharge status: Home / Self Care | Payer: BC Managed Care – PPO | Source: Ambulatory Visit | Attending: Adult Health | Admitting: Adult Health

## 2020-01-20 ENCOUNTER — Other Ambulatory Visit: Payer: Self-pay

## 2020-01-20 VITALS — BP 121/78 | HR 85 | Ht 62.5 in | Wt 194.0 lb

## 2020-01-20 DIAGNOSIS — Z01419 Encounter for gynecological examination (general) (routine) without abnormal findings: Secondary | ICD-10-CM | POA: Insufficient documentation

## 2020-01-20 DIAGNOSIS — Z1212 Encounter for screening for malignant neoplasm of rectum: Secondary | ICD-10-CM | POA: Diagnosis not present

## 2020-01-20 DIAGNOSIS — Z1211 Encounter for screening for malignant neoplasm of colon: Secondary | ICD-10-CM

## 2020-01-20 LAB — HEMOCCULT GUIAC POC 1CARD (OFFICE): Fecal Occult Blood, POC: NEGATIVE

## 2020-01-20 NOTE — Progress Notes (Signed)
Patient ID: Karla Ward, female   DOB: 1971-06-04, 50 y.o.   MRN: [id number] History of Present Illness: Karla Ward is a 49 year old black female,married, G2P0202, in for a well woman gyn exam and pap. Had Novasure ablation in 2018 and gets depo for birth control, may spot occsionally.  PCP is Dr Sudie Bailey.   Current Medications, Allergies, Past Medical History, Past Surgical History, Family History and Social History were reviewed in [name] record.     Review of Systems: Patient denies any headaches, hearing loss, fatigue, blurred vision, shortness of breath, chest pain, abdominal pain, problems with bowel movements, urination, or intercourse. No joint pain or mood swings.    Physical Exam:BP 121/78 (BP Location: Left Arm, Patient Position: Sitting, Cuff Size: Normal)   Pulse 85   Ht 5' 2.5" (1.588 m)   Wt 194 lb (88 kg)   BMI 34.92 kg/m  General:  Well developed, well nourished, no acute distress Skin:  Warm and dry Neck:  Midline trachea, normal thyroid, good ROM, no lymphadenopathy Lungs; Clear to auscultation bilaterally Breast:  No dominant palpable mass, retraction, or nipple discharge Cardiovascular: Regular rate and rhythm Abdomen:  Soft, non tender, no hepatosplenomegaly Pelvic:  External genitalia is normal in appearance, no lesions.  The vagina is normal in appearance. Urethra has no lesions or masses. The cervix is bulbous and smooth, pap with High risk HPV 16/18 genotyping and GC/CHL performed.  Uterus is felt to be normal size, shape, and contour.  No adnexal masses or tenderness noted.Bladder is non tender, no masses felt. Rectal: Good sphincter tone, no polyps, or hemorrhoids felt.  Hemoccult negative. Extremities/musculoskeletal:  No swelling or varicosities noted, no clubbing or cyanosis Psych:  No mood changes, alert and cooperative,seems happy Fall risk is low PHQ 2 score is 0. Examination chaperoned by Malachy Mood LPN.  Impression  and Plan:  1. Encounter for gynecological examination with Papanicolaou smear of cervix Pap sent Physical in 1 year Pap in 3 if normal Mammogram yearly Labs with PCP   2. Encounter for colorectal cancer screening Colonoscopy at 52

## 2020-01-21 ENCOUNTER — Other Ambulatory Visit: Payer: Self-pay

## 2020-01-21 ENCOUNTER — Ambulatory Visit (INDEPENDENT_AMBULATORY_CARE_PROVIDER_SITE_OTHER): Payer: BC Managed Care – PPO

## 2020-01-21 VITALS — Ht 62.4 in | Wt 194.0 lb

## 2020-01-21 DIAGNOSIS — Z3042 Encounter for surveillance of injectable contraceptive: Secondary | ICD-10-CM

## 2020-01-21 MED ORDER — MEDROXYPROGESTERONE ACETATE 150 MG/ML IM SUSP
150.0000 mg | Freq: Once | INTRAMUSCULAR | Status: AC
  Administered 2020-01-21: 150 mg via INTRAMUSCULAR

## 2020-01-21 NOTE — Progress Notes (Signed)
   NURSE VISIT- INJECTION  SUBJECTIVE:  Karla Ward is a 49 y.o. [phone] female here for a Depo Provera for contraception/period management. She is a GYN patient.   OBJECTIVE:  There were no vitals taken for this visit.  Appears well, in no apparent distress  Injection administered in: Right deltoid  Meds ordered this encounter  Medications  . medroxyPROGESTERone (DEPO-PROVERA) injection 150 mg    ASSESSMENT: GYN patient Depo Provera for contraception/period management PLAN: Follow-up: in 11-13 weeks for next Depo   Rennis Petty  01/21/2020 9:10 AM

## 2020-01-22 LAB — CYTOLOGY - PAP
Chlamydia: NEGATIVE
Comment: NEGATIVE
Comment: NEGATIVE
Comment: NORMAL
Diagnosis: UNDETERMINED — AB
High risk HPV: NEGATIVE
Neisseria Gonorrhea: NEGATIVE

## 2020-02-21 ENCOUNTER — Other Ambulatory Visit: Payer: Self-pay | Admitting: Gastroenterology

## 2020-02-21 DIAGNOSIS — K219 Gastro-esophageal reflux disease without esophagitis: Secondary | ICD-10-CM

## 2020-03-03 ENCOUNTER — Other Ambulatory Visit: Payer: Self-pay

## 2020-03-03 ENCOUNTER — Ambulatory Visit: Payer: BC Managed Care – PPO | Attending: Internal Medicine

## 2020-03-03 DIAGNOSIS — Z20822 Contact with and (suspected) exposure to covid-19: Secondary | ICD-10-CM

## 2020-03-04 LAB — SARS-COV-2, NAA 2 DAY TAT

## 2020-03-04 LAB — NOVEL CORONAVIRUS, NAA: SARS-CoV-2, NAA: NOT DETECTED

## 2020-04-07 ENCOUNTER — Ambulatory Visit (INDEPENDENT_AMBULATORY_CARE_PROVIDER_SITE_OTHER): Payer: BC Managed Care – PPO | Admitting: *Deleted

## 2020-04-07 DIAGNOSIS — Z3042 Encounter for surveillance of injectable contraceptive: Secondary | ICD-10-CM

## 2020-04-07 MED ORDER — MEDROXYPROGESTERONE ACETATE 150 MG/ML IM SUSP
150.0000 mg | Freq: Once | INTRAMUSCULAR | Status: AC
  Administered 2020-04-07: 150 mg via INTRAMUSCULAR

## 2020-04-07 NOTE — Progress Notes (Signed)
   NURSE VISIT- INJECTION  SUBJECTIVE:  Karla Ward is a 49 y.o. [phone] female here for a Depo Provera for contraception/period management. She is a GYN patient.   OBJECTIVE:  There were no vitals taken for this visit.  Appears well, in no apparent distress  Injection administered in: Left deltoid  Meds ordered this encounter  Medications  . medroxyPROGESTERone (DEPO-PROVERA) injection 150 mg    ASSESSMENT: GYN patient Depo Provera for contraception/period management PLAN: Follow-up: in 11-13 weeks for next Depo   Annamarie Dawley  04/07/2020 8:42 AM

## 2020-06-29 ENCOUNTER — Other Ambulatory Visit: Payer: Self-pay | Admitting: Adult Health

## 2020-06-30 ENCOUNTER — Telehealth: Payer: Self-pay | Admitting: Adult Health

## 2020-06-30 ENCOUNTER — Ambulatory Visit: Payer: BC Managed Care – PPO

## 2020-06-30 NOTE — Telephone Encounter (Signed)
Pt aware Depo has been sent to pharmacy. JSY

## 2020-06-30 NOTE — Telephone Encounter (Signed)
Patient had a depo shot scheduled for today but when she went to the pharmacy yesterday she was told she needed an authorization refill from the doc.  Once the prescription has been sent to the pharmacy the patient would like a call back

## 2020-07-01 ENCOUNTER — Ambulatory Visit (INDEPENDENT_AMBULATORY_CARE_PROVIDER_SITE_OTHER): Payer: BC Managed Care – PPO | Admitting: *Deleted

## 2020-07-01 DIAGNOSIS — Z3042 Encounter for surveillance of injectable contraceptive: Secondary | ICD-10-CM | POA: Diagnosis not present

## 2020-07-01 MED ORDER — MEDROXYPROGESTERONE ACETATE 150 MG/ML IM SUSP
150.0000 mg | Freq: Once | INTRAMUSCULAR | Status: AC
  Administered 2020-07-01: 150 mg via INTRAMUSCULAR

## 2020-07-01 NOTE — Progress Notes (Signed)
   NURSE VISIT- INJECTION  SUBJECTIVE:  Karla Ward is a 49 y.o. [phone] female here for a Depo Provera for contraception/period management. She is a GYN patient.   OBJECTIVE:  There were no vitals taken for this visit.  Appears well, in no apparent distress  Injection administered in: Right deltoid  Meds ordered this encounter  Medications  . medroxyPROGESTERone (DEPO-PROVERA) injection 150 mg    ASSESSMENT: GYN patient Depo Provera for contraception/period management PLAN: Follow-up: in 11-13 weeks for next Depo   Jobe Marker  07/01/2020 4:00 PM

## 2020-09-10 ENCOUNTER — Other Ambulatory Visit: Payer: Self-pay | Admitting: Family Medicine

## 2020-09-10 DIAGNOSIS — Z1231 Encounter for screening mammogram for malignant neoplasm of breast: Secondary | ICD-10-CM

## 2020-09-23 ENCOUNTER — Ambulatory Visit (INDEPENDENT_AMBULATORY_CARE_PROVIDER_SITE_OTHER): Payer: BC Managed Care – PPO | Admitting: *Deleted

## 2020-09-23 ENCOUNTER — Other Ambulatory Visit: Payer: Self-pay

## 2020-09-23 DIAGNOSIS — Z3042 Encounter for surveillance of injectable contraceptive: Secondary | ICD-10-CM

## 2020-09-23 MED ORDER — MEDROXYPROGESTERONE ACETATE 150 MG/ML IM SUSP
150.0000 mg | Freq: Once | INTRAMUSCULAR | Status: AC
  Administered 2020-09-23: 150 mg via INTRAMUSCULAR

## 2020-09-23 NOTE — Progress Notes (Signed)
   NURSE VISIT- INJECTION  SUBJECTIVE:  Karla Ward is a 49 y.o. [phone] female here for a Depo Provera for contraception/period management. She is a GYN patient.   OBJECTIVE:  There were no vitals taken for this visit.  Appears well, in no apparent distress  Injection administered in: Left deltoid  Meds ordered this encounter  Medications  . medroxyPROGESTERone (DEPO-PROVERA) injection 150 mg    ASSESSMENT: GYN patient Depo Provera for contraception/period management PLAN: Follow-up: in 11-13 weeks for next Depo   Jobe Marker  09/23/2020 10:05 AM

## 2020-10-06 NOTE — Progress Notes (Signed)
Cardiology Office Note:   Date:  10/09/2020  NAME:  Karla Ward    MRN: [id number] DOB:  09/02/1971   PCP:  Gareth MorganKnowlton, Steve, MD  Cardiologist:  No primary care provider on file.   Referring MD: Gareth MorganKnowlton, Steve, MD   Chief Complaint  Patient presents with  . New Patient (Initial Visit)  . Palpitations  . Shortness of Breath   History of Present Illness:   Karla Ward is a 49 y.o. female with a hx of HTN who is being seen today for the evaluation of palpitations at the request of Gareth MorganKnowlton, Steve, MD.  She reports for the last 1 to 2 months she had episodes of palpitations and shortness of breath.  Apparently she can feel her heart racing at any time.  It is occurring 3 times per week.  It can last seconds.  It occurs randomly.  No identifiable triggers.  No alleviating factors.  She also reports she gets short of breath with exertion or with laying down.  She reports that her primary care physician started her on albuterol due to wheezing.  She does not have any history of lung disease.  She is a never smoker.  She reports she does not drink coffee or caffeinated beverages in excess.  No energy drink reported.  No recent thyroid studies in our system.  She is not diabetic.  She is never smoker.  She does not drink alcohol or use drugs.  She is obese with a BMI of 35.  She presents with her husband.  She does snore.  She reports she is not excessively tired and does not fall asleep easily in the afternoons.  She does screen negative for sleep apnea.  She denies any chest pain or shortness of breath with exertion.  Symptoms appear to care mainly when laying down.  She has no evidence of heart failure on examination.  Cardiovascular exam is normal.  EKG is normal.  She does report a history of congestive heart failure in her sister.  Past Medical History: Past Medical History:  Diagnosis Date  . Anemia   . Gastritis   . GERD (gastroesophageal reflux disease)   .  Hypertension   . Status post dilation of esophageal narrowing   . Vertigo     Past Surgical History: Past Surgical History:  Procedure Laterality Date  . DILITATION & CURRETTAGE/HYSTROSCOPY WITH NOVASURE ABLATION N/A 04/05/2017   Procedure: DILATATION & CURETTAGE/HYSTEROSCOPY WITH NOVASURE ENDOMETRIAL ABLATION;  Surgeon: Lazaro ArmsEure, Luther H, MD;  Location: [facility] ORS;  Service: Gynecology;  Laterality: N/A;  . ESOPHAGOGASTRODUODENOSCOPY N/A 09/02/2015   WUJ:WJXBSLF:mild non-erosive gastritis/stricture at the gastroesophageal junction   . NONE TO DATE  08/13/15  . SAVORY DILATION  09/02/2015   Procedure: SAVORY DILATION;  Surgeon: West BaliSandi L Fields, MD;  Location: [facility] ENDO SUITE;  Service: Endoscopy;;    Current Medications: Current Meds  Medication Sig  . albuterol (PROVENTIL HFA;VENTOLIN HFA) 108 (90 Base) MCG/ACT inhaler Inhale 1-2 puffs into the lungs every 6 (six) hours as needed for wheezing or shortness of breath.  Marland Kitchen. amLODipine (NORVASC) 5 MG tablet Take 5 mg by mouth daily.  . Ascorbic Acid (VITAMIN C PO) Take by mouth daily.  Marland Kitchen. dicyclomine (BENTYL) 20 MG tablet Take 1 tablet (20 mg total) by mouth 2 (two) times daily as needed for spasms.  . medroxyPROGESTERone (DEPO-PROVERA) 150 MG/ML injection INJECT 1 ML (150 MG TOTAL) INTO THE MUSCLE EVERY 3 (THREE) MONTHS.  Marland Kitchen. omeprazole (PRILOSEC) 20 MG capsule  TAKE (1) CAPSULE BY MOUTH TWICE DAILY 30 MINUTES PRIOR TO MEALS.  Marland Kitchen ondansetron (ZOFRAN ODT) 4 MG disintegrating tablet Take 1 tablet (4 mg total) by mouth every 8 (eight) hours as needed for nausea or vomiting.  Marland Kitchen VITAMIN D PO Take by mouth daily.  . Wheat Dextrin (BENEFIBER DRINK MIX) PACK Take by mouth.     Allergies:    Benadryl [diphenhydramine hcl (sleep)] and Penicillins   Social History: Social History   Socioeconomic History  . Marital status: Married    Spouse name: Not on file  . Number of children: 2  . Years of education: Not on file  . Highest education level: Not on file   Occupational History  . Not on file  Tobacco Use  . Smoking status: Never Smoker  . Smokeless tobacco: Never Used  Vaping Use  . Vaping Use: Never used  Substance and Sexual Activity  . Alcohol use: No  . Drug use: No  . Sexual activity: Yes    Birth control/protection: Surgical, Injection    Comment: ablation  Other Topics Concern  . Not on file  Social History Narrative  . Not on file   Social Determinants of Health   Financial Resource Strain: Not on file  Food Insecurity: Not on file  Transportation Needs: Not on file  Physical Activity: Not on file  Stress: Not on file  Social Connections: Not on file    Family History: The patient's family history includes Congestive Heart Failure in her sister; Hypertension in her father and mother; Throat cancer (age of onset: 67) in her father. There is no history of Colon cancer or Colon polyps.  ROS:   All other ROS reviewed and negative. Pertinent positives noted in the HPI.     EKGs/Labs/Other Studies Reviewed:   The following studies were personally reviewed by me today:  EKG:  EKG is ordered today.  The ekg ordered today demonstrates normal sinus rhythm, heart rate 78, no acute ischemic changes, no evidence of prior infarction, and was personally reviewed by me.   Recent Labs: No results found for requested labs within last 8760 hours.   Recent Lipid Panel No results found for: CHOL, TRIG, HDL, CHOLHDL, VLDL, LDLCALC, LDLDIRECT  Physical Exam:   VS:  BP 122/90 (BP Location: Right Arm, Patient Position: Sitting, Cuff Size: Large)   Pulse 78   Ht 5\' 4"  (1.626 m)   Wt 204 lb (92.5 kg)   BMI 35.02 kg/m    Wt Readings from Last 3 Encounters:  10/09/20 204 lb (92.5 kg)  01/21/20 194 lb (88 kg)  01/20/20 194 lb (88 kg)    General: Well nourished, well developed, in no acute distress Head: Atraumatic, normal size  Eyes: PEERLA, EOMI  Neck: Supple, no JVD Endocrine: No thryomegaly Cardiac: Normal S1, S2; RRR; no  murmurs, rubs, or gallops Lungs: Clear to auscultation bilaterally, no wheezing, rhonchi or rales  Abd: Soft, nontender, no hepatomegaly  Ext: No edema, pulses 2+ Musculoskeletal: No deformities, BUE and BLE strength normal and equal Skin: Warm and dry, no rashes   Neuro: Alert and oriented to person, place, time, and situation, CNII-XII grossly intact, no focal deficits  Psych: Normal mood and affect   ASSESSMENT:   Nikira Kushnir is a 49 y.o. female who presents for the following: 1. Palpitations   2. SOB (shortness of breath)   3. Primary hypertension     PLAN:   1. Palpitations -Episodes of palpitations over the last  1 to 2 months.  EKG is normal.  Cardiovascular exam is normal.  I would like to check a CBC and TSH.  I would also like to rule out any arrhythmias with a 7-day Zio patch.  Unclear what is going on but we do need to exclude arrhythmia.  No real significant stress.  We will await her monitor for further guidance.  2. SOB (shortness of breath) -She does report shortness of breath mainly at night with laying down.  She was started on an albuterol inhaler with some improvement.  Her EKG is normal.  She has no symptoms of angina.  Her cardiovascular lamination is normal.  Given her family history of congestive heart failure I would like to obtain an echocardiogram.  We will also order a BNP.  3. Primary hypertension -Blood pressure well controlled on amlodipine.  Disposition: Return in about 3 months (around 01/07/2021).  Medication Adjustments/Labs and Tests Ordered: Current medicines are reviewed at length with the patient today.  Concerns regarding medicines are outlined above.  Orders Placed This Encounter  Procedures  . Brain natriuretic peptide  . TSH  . LONG TERM MONITOR (3-14 DAYS)  . EKG 12-Lead  . ECHOCARDIOGRAM COMPLETE   No orders of the defined types were placed in this encounter.   Patient Instructions  Medication Instructions:  No  changes *If you need a refill on your cardiac medications before your next appointment, please call your pharmacy*   Lab Work: TSH and BNP in our office today If you have labs (blood work) drawn today and your tests are completely normal, you will receive your results only by: Marland Kitchen MyChart Message (if you have MyChart) OR . A paper copy in the mail If you have any lab test that is abnormal or we need to change your treatment, we will call you to review the results.   Testing/Procedures: Your physician has requested that you have an echocardiogram. Echocardiography is a painless test that uses sound waves to create images of your heart. It provides your doctor with information about the size and shape of your heart and how well your heart's chambers and valves are working. This procedure takes approximately one hour. There are no restrictions for this procedure.   ZIO XT- Long Term Monitor Instructions   Your physician has requested you wear your ZIO patch monitor___7____days.   This is a single patch monitor.  [employer] supplies one patch monitor per enrollment.  Additional stickers are not available.   Please do not apply patch if you will be having a Nuclear Stress Test, Echocardiogram, Cardiac CT, MRI, or Chest Xray during the time frame you would be wearing the monitor. The patch cannot be worn during these tests.  You cannot remove and re-apply the ZIO XT patch monitor.   Your ZIO patch monitor will be sent USPS Priority mail from [facility] directly to your home address. The monitor may also be mailed to a PO BOX if home delivery is not available.   It may take 3-5 days to receive your monitor after you have been enrolled.   Once you have received you monitor, please review enclosed instructions.  Your monitor has already been registered assigning a specific monitor serial # to you.   Applying the monitor   Shave hair from upper left chest.   Hold abrader disc by orange  tab.  Rub abrader in 40 strokes over left upper chest as indicated in your monitor instructions.   Clean area with 4  enclosed alcohol pads .  Use all pads to assure are is cleaned thoroughly.  Let dry.   Apply patch as indicated in monitor instructions.  Patch will be place under collarbone on left side of chest with arrow pointing upward.   Rub patch adhesive wings for 2 minutes.Remove white label marked "1".  Remove white label marked "2".  Rub patch adhesive wings for 2 additional minutes.   While looking in a mirror, press and release button in center of patch.  A small green light will flash 3-4 times .  This will be your only indicator the monitor has been turned on.     Do not shower for the first 24 hours.  You may shower after the first 24 hours.   Press button if you feel a symptom. You will hear a small click.  Record Date, Time and Symptom in the Patient Log Book.   When you are ready to remove patch, follow instructions on last 2 pages of Patient Log Book.  Stick patch monitor onto last page of Patient Log Book.   Place Patient Log Book in [place] box.  Use locking tab on box and tape box closed securely.  The Orange and [employer] has [employer] on it.  Please place in mailbox as soon as possible.  Your physician should have your test results approximately 7 days after the monitor has been mailed back to [facility].   Call [facility] Customer Care at [phone] if you have questions regarding your ZIO XT patch monitor.  Call them immediately if you see an orange light blinking on your monitor.   If your monitor falls off in less than 4 days contact our Monitor department at [phone].  If your monitor becomes loose or falls off after 4 days call [employer] at [phone] for suggestions on securing your monitor.    Follow-Up: At [facility], you and your health needs are our priority.  As part of our continuing mission to provide you with exceptional heart  care, we have created designated Provider Care Teams.  These Care Teams include your primary Cardiologist (physician) and Advanced Practice Providers (APPs -  Physician Assistants and Nurse Practitioners) who all work together to provide you with the care you need, when you need it.  We recommend signing up for the patient portal called "MyChart".  Sign up information is provided on this After Visit Summary.  MyChart is used to connect with patients for Virtual Visits (Telemedicine).  Patients are able to view lab/test results, encounter notes, upcoming appointments, etc.  Non-urgent messages can be sent to your provider as well.   To learn more about what you can do with MyChart, go to [email or web address].    Your next appointment:   3 month(s)  The format for your next appointment:   In Person  Provider:   Lennie Odor, MD   Other Instructions None       Signed, Lenna Gilford. Flora Lipps, MD [facility]  [address], [address] [place], [place] [address] [phone]  10/09/2020 2:38 PM

## 2020-10-09 ENCOUNTER — Other Ambulatory Visit: Payer: Self-pay

## 2020-10-09 ENCOUNTER — Telehealth: Payer: Self-pay | Admitting: Radiology

## 2020-10-09 ENCOUNTER — Encounter: Payer: Self-pay | Admitting: Cardiovascular Disease

## 2020-10-09 ENCOUNTER — Ambulatory Visit: Payer: BC Managed Care – PPO | Admitting: Cardiovascular Disease

## 2020-10-09 VITALS — BP 122/90 | HR 78 | Ht 64.0 in | Wt 204.0 lb

## 2020-10-09 DIAGNOSIS — I10 Essential (primary) hypertension: Secondary | ICD-10-CM | POA: Diagnosis not present

## 2020-10-09 DIAGNOSIS — R0602 Shortness of breath: Secondary | ICD-10-CM

## 2020-10-09 DIAGNOSIS — R002 Palpitations: Secondary | ICD-10-CM

## 2020-10-09 NOTE — Patient Instructions (Addendum)
Medication Instructions:  No changes *If you need a refill on your cardiac medications before your next appointment, please call your pharmacy*   Lab Work: TSH and BNP in our office today If you have labs (blood work) drawn today and your tests are completely normal, you will receive your results only by: Marland Kitchen MyChart Message (if you have MyChart) OR . A paper copy in the mail If you have any lab test that is abnormal or we need to change your treatment, we will call you to review the results.   Testing/Procedures: Your physician has requested that you have an echocardiogram. Echocardiography is a painless test that uses sound waves to create images of your heart. It provides your doctor with information about the size and shape of your heart and how well your heart's chambers and valves are working. This procedure takes approximately one hour. There are no restrictions for this procedure.   ZIO XT- Long Term Monitor Instructions   Your physician has requested you wear your ZIO patch monitor___7____days.   This is a single patch monitor.  [employer] supplies one patch monitor per enrollment.  Additional stickers are not available.   Please do not apply patch if you will be having a Nuclear Stress Test, Echocardiogram, Cardiac CT, MRI, or Chest Xray during the time frame you would be wearing the monitor. The patch cannot be worn during these tests.  You cannot remove and re-apply the ZIO XT patch monitor.   Your ZIO patch monitor will be sent USPS Priority mail from [facility] directly to your home address. The monitor may also be mailed to a PO BOX if home delivery is not available.   It may take 3-5 days to receive your monitor after you have been enrolled.   Once you have received you monitor, please review enclosed instructions.  Your monitor has already been registered assigning a specific monitor serial # to you.   Applying the monitor   Shave hair from upper left chest.    Hold abrader disc by orange tab.  Rub abrader in 40 strokes over left upper chest as indicated in your monitor instructions.   Clean area with 4 enclosed alcohol pads .  Use all pads to assure are is cleaned thoroughly.  Let dry.   Apply patch as indicated in monitor instructions.  Patch will be place under collarbone on left side of chest with arrow pointing upward.   Rub patch adhesive wings for 2 minutes.Remove white label marked "1".  Remove white label marked "2".  Rub patch adhesive wings for 2 additional minutes.   While looking in a mirror, press and release button in center of patch.  A small green light will flash 3-4 times .  This will be your only indicator the monitor has been turned on.     Do not shower for the first 24 hours.  You may shower after the first 24 hours.   Press button if you feel a symptom. You will hear a small click.  Record Date, Time and Symptom in the Patient Log Book.   When you are ready to remove patch, follow instructions on last 2 pages of Patient Log Book.  Stick patch monitor onto last page of Patient Log Book.   Place Patient Log Book in Peck box.  Use locking tab on box and tape box closed securely.  The [employer] and [employer] has [employer] on it.  Please place in mailbox as soon as possible.  Your physician should have your test results approximately 7 days after the monitor has been mailed back to [facility].   Call [facility] Customer Care at [phone] if you have questions regarding your ZIO XT patch monitor.  Call them immediately if you see an orange light blinking on your monitor.   If your monitor falls off in less than 4 days contact our Monitor department at [phone].  If your monitor becomes loose or falls off after 4 days call [employer] at [phone] for suggestions on securing your monitor.    Follow-Up: At [facility], you and your health needs are our priority.  As part of our continuing mission to provide  you with exceptional heart care, we have created designated Provider Care Teams.  These Care Teams include your primary Cardiologist (physician) and Advanced Practice Providers (APPs -  Physician Assistants and Nurse Practitioners) who all work together to provide you with the care you need, when you need it.  We recommend signing up for the patient portal called "MyChart".  Sign up information is provided on this After Visit Summary.  MyChart is used to connect with patients for Virtual Visits (Telemedicine).  Patients are able to view lab/test results, encounter notes, upcoming appointments, etc.  Non-urgent messages can be sent to your provider as well.   To learn more about what you can do with MyChart, go to [email or web address].    Your next appointment:   3 month(s)  The format for your next appointment:   In Person  Provider:   Lennie Odor, MD   Other Instructions None

## 2020-10-09 NOTE — Telephone Encounter (Signed)
Enrolled patient for a 7 day Zio XT monitor to be mailed to patients home.  

## 2020-10-10 LAB — BRAIN NATRIURETIC PEPTIDE: BNP: 12.7 pg/mL (ref 0.0–100.0)

## 2020-10-10 LAB — TSH: TSH: 1.24 u[IU]/mL (ref 0.450–4.500)

## 2020-10-21 ENCOUNTER — Other Ambulatory Visit: Payer: Self-pay

## 2020-10-21 ENCOUNTER — Ambulatory Visit
Admission: RE | Admit: 2020-10-21 | Discharge: 2020-10-21 | Disposition: A | Discharge status: Home / Self Care | Payer: BC Managed Care – PPO | Source: Ambulatory Visit | Attending: Family Medicine | Admitting: Family Medicine

## 2020-10-21 DIAGNOSIS — Z1231 Encounter for screening mammogram for malignant neoplasm of breast: Secondary | ICD-10-CM

## 2020-11-02 ENCOUNTER — Other Ambulatory Visit (HOSPITAL_COMMUNITY): Payer: BC Managed Care – PPO

## 2020-11-02 ENCOUNTER — Encounter (HOSPITAL_COMMUNITY): Payer: Self-pay | Admitting: Cardiovascular Disease

## 2020-11-11 ENCOUNTER — Ambulatory Visit: Payer: BC Managed Care – PPO | Admitting: Internal Medicine

## 2020-11-13 ENCOUNTER — Telehealth (HOSPITAL_COMMUNITY): Payer: Self-pay | Admitting: Cardiovascular Disease

## 2020-11-13 NOTE — Telephone Encounter (Signed)
Just an FYI. We have made several attempts to contact this patient including sending a letter to schedule or reschedule their echocardiogram. We will be removing the patient from the echo WQ.  11/02/20 NO SHOW -MAILED LETTER/LBW        Thank you

## 2020-12-10 ENCOUNTER — Encounter: Payer: Self-pay | Admitting: Internal Medicine

## 2020-12-10 ENCOUNTER — Ambulatory Visit: Payer: BC Managed Care – PPO | Admitting: Internal Medicine

## 2020-12-16 ENCOUNTER — Other Ambulatory Visit (INDEPENDENT_AMBULATORY_CARE_PROVIDER_SITE_OTHER): Payer: BC Managed Care – PPO

## 2020-12-16 ENCOUNTER — Other Ambulatory Visit: Payer: Self-pay

## 2020-12-16 DIAGNOSIS — Z3042 Encounter for surveillance of injectable contraceptive: Secondary | ICD-10-CM

## 2020-12-16 MED ORDER — MEDROXYPROGESTERONE ACETATE 150 MG/ML IM SUSY: PREFILLED_SYRINGE | Freq: Once | INTRAMUSCULAR | Status: AC

## 2020-12-16 NOTE — Progress Notes (Signed)
   NURSE VISIT- INJECTION  SUBJECTIVE:  Karla Ward is Karla 50 y.o. [phone] female here for Karla Depo Provera for contraception/period management. She is Karla GYN patient.   OBJECTIVE:  There were no vitals taken for this visit.  Appears well, in no apparent distress  Injection administered in: Right deltoid  Meds ordered this encounter  Medications  . medroxyPROGESTERone Acetate SUSY    ASSESSMENT: GYN patient Depo Provera for contraception/period management PLAN: Follow-up: in 11-13 weeks for next Depo   Karla Ward Karla Ward  12/16/2020 9:48 AM

## 2021-01-01 ENCOUNTER — Emergency Department (HOSPITAL_COMMUNITY)
Admission: EM | Admit: 2021-01-01 | Discharge: 2021-01-01 | Disposition: A | Discharge status: Home / Self Care | Payer: BC Managed Care – PPO | Attending: Emergency Medicine | Admitting: Emergency Medicine

## 2021-01-01 ENCOUNTER — Encounter (HOSPITAL_COMMUNITY): Payer: Self-pay

## 2021-01-01 ENCOUNTER — Emergency Department (HOSPITAL_COMMUNITY): Payer: BC Managed Care – PPO

## 2021-01-01 ENCOUNTER — Other Ambulatory Visit: Payer: Self-pay

## 2021-01-01 DIAGNOSIS — I10 Essential (primary) hypertension: Secondary | ICD-10-CM | POA: Insufficient documentation

## 2021-01-01 DIAGNOSIS — R42 Dizziness and giddiness: Secondary | ICD-10-CM | POA: Diagnosis not present

## 2021-01-01 DIAGNOSIS — Z79899 Other long term (current) drug therapy: Secondary | ICD-10-CM | POA: Insufficient documentation

## 2021-01-01 DIAGNOSIS — R519 Headache, unspecified: Secondary | ICD-10-CM | POA: Insufficient documentation

## 2021-01-01 LAB — CBC WITH DIFFERENTIAL/PLATELET
Abs Immature Granulocytes: 0.04 10*3/uL (ref 0.00–0.07)
Basophils Absolute: 0.1 10*3/uL (ref 0.0–0.1)
Basophils Relative: 1 %
Eosinophils Absolute: 0.5 10*3/uL (ref 0.0–0.5)
Eosinophils Relative: 4 %
HCT: 42.5 % (ref 36.0–46.0)
Hemoglobin: 13.7 g/dL (ref 12.0–15.0)
Immature Granulocytes: 0 %
Lymphocytes Relative: 26 %
Lymphs Abs: 3.4 10*3/uL (ref 0.7–4.0)
MCH: 29.1 pg (ref 26.0–34.0)
MCHC: 32.2 g/dL (ref 30.0–36.0)
MCV: 90.2 fL (ref 80.0–100.0)
Monocytes Absolute: 1 10*3/uL (ref 0.1–1.0)
Monocytes Relative: 8 %
Neutro Abs: 8.1 10*3/uL — ABNORMAL HIGH (ref 1.7–7.7)
Neutrophils Relative %: 61 %
Platelets: 322 10*3/uL (ref 150–400)
RBC: 4.71 MIL/uL (ref 3.87–5.11)
RDW: 14 % (ref 11.5–15.5)
WBC: 13.1 10*3/uL — ABNORMAL HIGH (ref 4.0–10.5)
nRBC: 0 % (ref 0.0–0.2)

## 2021-01-01 LAB — BASIC METABOLIC PANEL
Anion gap: 10 (ref 5–15)
BUN: 10 mg/dL (ref 6–20)
CO2: 21 mmol/L — ABNORMAL LOW (ref 22–32)
Calcium: 8.9 mg/dL (ref 8.9–10.3)
Chloride: 108 mmol/L (ref 98–111)
Creatinine, Ser: 1.04 mg/dL — ABNORMAL HIGH (ref 0.44–1.00)
GFR, Estimated: >60 mL/min (ref >60)
Glucose, Bld: 100 mg/dL — ABNORMAL HIGH (ref 70–99)
Potassium: 3.4 mmol/L — ABNORMAL LOW (ref 3.5–5.1)
Sodium: 139 mmol/L (ref 135–145)

## 2021-01-01 NOTE — ED Notes (Signed)
Pt ambulated to BR with steady gait, denies dizziness, no difficulty reported

## 2021-01-01 NOTE — ED Triage Notes (Signed)
Pt arrived EMS with c/o headache and dizziness. Pt says this started about an hour ago while driving to pick up spouse from work.

## 2021-01-01 NOTE — ED Provider Notes (Signed)
[facility] EMERGENCY DEPARTMENT Provider Note   CSN: [id number] Arrival date & time: 01/01/21  0037     History Chief Complaint  Patient presents with  . Headache    dizzy    Karla Ward is a 50 y.o. female.  Patient is a 50 year old female with past medical history of vertigo, hypertension, and GERD presenting with complaints of headache and dizziness.  Patient started acutely this evening with 2 episodes of what she describes as a "spasm" in her head.  This lasted for several seconds and is quite uncomfortable.  She reports having to squeeze her head together with her hands to make the pain go away.  This was not associated with any visual disturbances, weakness, or numbness.  She had a third episode while she was driving.  She had just picked her husband up from work when she was turning a corner and experienced an additional episode.  She describes the same spasm in her head with associated dizziness.  She then called 911 and was transported here.  Symptoms have since resolved.  She was transported here uneventfully with stable vital signs.  The history is provided by the patient.       Past Medical History:  Diagnosis Date  . Anemia   . Gastritis   . GERD (gastroesophageal reflux disease)   . Hypertension   . Status post dilation of esophageal narrowing   . Vertigo     Patient Active Problem List   Diagnosis Date Noted  . Encounter for colorectal cancer screening 01/20/2020  . Encounter for gynecological examination with Papanicolaou smear of cervix 01/20/2020  . Colon cancer screening 07/06/2017  . N&V (nausea and vomiting) 09/14/2016  . Menorrhagia 05/30/2016  . GERD (gastroesophageal reflux disease) 08/13/2015  . Palpitations 11/22/2012    Past Surgical History:  Procedure Laterality Date  . DILITATION & CURRETTAGE/HYSTROSCOPY WITH NOVASURE ABLATION N/A 04/05/2017   Procedure: DILATATION & CURETTAGE/HYSTEROSCOPY WITH NOVASURE ENDOMETRIAL ABLATION;   Surgeon: Lazaro Arms, MD;  Location: [facility] ORS;  Service: Gynecology;  Laterality: N/A;  . 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 NONE TO DATE  08/13/15  . SAVORY DILATION  09/02/2015   Procedure: SAVORY DILATION;  Surgeon: West Bali, MD;  Location: [facility] ENDO SUITE;  Service: Endoscopy;;     OB History    Gravida  2   Para  2   Term      Preterm  2   AB      Living  2     SAB      IAB      Ectopic      Multiple      Live Births  2           Family History  Problem Relation Age of Onset  . Throat cancer Father 68  . Hypertension Father   . Hypertension Mother   . Congestive Heart Failure Sister   . Colon cancer Neg Hx   . Colon polyps Neg Hx     Social History   Tobacco Use  . Smoking status: Never Smoker  . Smokeless tobacco: Never Used  Vaping Use  . Vaping Use: Never used  Substance Use Topics  . Alcohol use: No  . Drug use: No    Home Medications Prior to Admission medications   Medication Sig Start Date End Date Taking? Authorizing Provider  albuterol (PROVENTIL HFA;VENTOLIN HFA) 108 (90 Base) MCG/ACT inhaler  Inhale 1-2 puffs into the lungs every 6 (six) hours as needed for wheezing or shortness of breath. 10/28/17   Couture, Cortni S, PA-C  amLODipine (NORVASC) 5 MG tablet Take 5 mg by mouth daily.    [provider]  Ascorbic Acid (VITAMIN C PO) Take by mouth daily.    [provider]  dicyclomine (BENTYL) 20 MG tablet Take 1 tablet (20 mg total) by mouth 2 (two) times daily as needed for spasms. 12/29/18   Caccavale, Sophia, PA-C  medroxyPROGESTERone (DEPO-PROVERA) 150 MG/ML injection INJECT 1 ML (150 MG TOTAL) INTO THE MUSCLE EVERY 3 (THREE) MONTHS. 06/30/20   Adline Potter, NP  omeprazole (PRILOSEC) 20 MG capsule TAKE (1) CAPSULE BY MOUTH TWICE DAILY 30 MINUTES PRIOR TO MEALS. 02/21/20   Anice Paganini, NP  ondansetron (ZOFRAN ODT) 4 MG  disintegrating tablet Take 1 tablet (4 mg total) by mouth every 8 (eight) hours as needed for nausea or vomiting. 12/29/18   Caccavale, Sophia, PA-C  VITAMIN D PO Take by mouth daily.    [provider]  Wheat Dextrin (BENEFIBER DRINK MIX) PACK Take by mouth.    [provider]    Allergies    Benadryl [diphenhydramine hcl (sleep)] and Penicillins  Review of Systems   Review of Systems  All other systems reviewed and are negative.   Physical Exam Updated Vital Signs BP (!) 149/84 (BP Location: Left Arm)   Pulse 100   Temp 98.4 F (36.9 C) (Oral)   Resp 17   Ht 5\' 4"  (1.626 m)   Wt 90.7 kg   SpO2 98%   BMI 34.33 kg/m   Physical Exam Vitals and nursing note reviewed.  Constitutional:      General: She is not in acute distress.    Appearance: She is well-developed and well-nourished. She is not diaphoretic.  HENT:     Head: Normocephalic and atraumatic.  Eyes:     General: No visual field deficit.    Extraocular Movements: Extraocular movements intact.     Pupils: Pupils are equal, round, and reactive to light.  Cardiovascular:     Rate and Rhythm: Normal rate and regular rhythm.     Heart sounds: No murmur heard. No friction rub. No gallop.   Pulmonary:     Effort: Pulmonary effort is normal. No respiratory distress.     Breath sounds: Normal breath sounds. No wheezing.  Abdominal:     General: Bowel sounds are normal. There is no distension.     Palpations: Abdomen is soft.     Tenderness: There is no abdominal tenderness.  Musculoskeletal:        General: Normal range of motion.     Cervical back: Normal range of motion and neck supple. No rigidity.  Lymphadenopathy:     Cervical: No cervical adenopathy.  Skin:    General: Skin is warm and dry.  Neurological:     Mental Status: She is alert and oriented to person, place, and time.     Cranial Nerves: No cranial nerve deficit, dysarthria or facial asymmetry.     Sensory: No sensory deficit.      Motor: No weakness.     ED Results / Procedures / Treatments   Labs (all labs ordered are listed, but only abnormal results are displayed) Labs Reviewed  BASIC METABOLIC PANEL  CBC WITH DIFFERENTIAL/PLATELET    EKG None  Radiology No results found.  Procedures Procedures   Medications Ordered in ED Medications -  No data to display  ED Course  I have reviewed the triage vital signs and the nursing notes.  Pertinent labs & imaging results that were available during my care of the patient were reviewed by me and considered in my medical decision making (see chart for details).    MDM Rules/Calculators/A&P  Patient presenting here with complaints of head discomfort as described in the HPI.  She arrives here with his normal physical examination and is neurologically intact.  Patient's head CT is negative and laboratory studies are unremarkable.  I am uncertain as to the exact etiology of these symptoms, however nothing appears emergent.  Patient has history of vertigo and possibly could be related.  Also considered is some sort of tension headache or atypical migraine.  Final Clinical Impression(s) / ED Diagnoses Final diagnoses:  None    Rx / DC Orders ED Discharge Orders    None       Geoffery Lyons, MD 01/01/21 0206

## 2021-01-01 NOTE — Discharge Instructions (Addendum)
Take ibuprofen 600 mg every 6 hours as needed for pain.  Follow-up with primary doctor if symptoms or not improving in the next few days, and return to the ER if you develop worsening pain, visual disturbances, weakness/numbness, or other new and concerning symptoms.

## 2021-01-07 ENCOUNTER — Ambulatory Visit: Payer: BC Managed Care – PPO | Admitting: Cardiovascular Disease

## 2021-01-13 ENCOUNTER — Ambulatory Visit
Admission: RE | Admit: 2021-01-13 | Discharge: 2021-01-13 | Disposition: A | Discharge status: Home / Self Care | Payer: BC Managed Care – PPO | Source: Ambulatory Visit | Attending: Family Medicine | Admitting: Family Medicine

## 2021-01-13 ENCOUNTER — Other Ambulatory Visit: Payer: Self-pay

## 2021-01-13 VITALS — BP 126/76 | HR 94 | Temp 98.2°F | Resp 18

## 2021-01-13 DIAGNOSIS — Z20822 Contact with and (suspected) exposure to covid-19: Secondary | ICD-10-CM

## 2021-01-13 NOTE — ED Triage Notes (Signed)
Here for covid test only 

## 2021-01-14 LAB — SARS-COV-2, NAA 2 DAY TAT

## 2021-01-14 LAB — NOVEL CORONAVIRUS, NAA: SARS-CoV-2, NAA: NOT DETECTED

## 2021-01-14 NOTE — Progress Notes (Deleted)
Cardiology Office Note:   Date:  01/14/2021  NAME:  Karla Ward    MRN: [id number] DOB:  Mar 19, 1971   PCP:  Gareth Morgan, MD  Cardiologist:  No primary care provider on file.  Electrophysiologist:  None   Referring MD: Gareth Morgan, MD   No chief complaint on file. ***  History of Present Illness:   Karla Ward is a 50 y.o. female with a hx of HTN who presents for follow-up of palpitations and SOB. Did not complete zio patch or echo.   Past Medical History: Past Medical History:  Diagnosis Date  . Anemia   . Gastritis   . GERD (gastroesophageal reflux disease)   . Hypertension   . Status post dilation of esophageal narrowing   . Vertigo     Past Surgical History: Past Surgical History:  Procedure Laterality Date  . DILITATION & CURRETTAGE/HYSTROSCOPY WITH NOVASURE ABLATION N/A 04/05/2017   Procedure: DILATATION & CURETTAGE/HYSTEROSCOPY WITH NOVASURE ENDOMETRIAL ABLATION;  Surgeon: Lazaro Arms, MD;  Location: [facility]S;  Service: Gynecology;  Laterality: N/A;  . 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 NONE TO DATE  08/13/15  . SAVORY DILATION  09/02/2015   Procedure: SAVORY DILATION;  Surgeon: West Bali, MD;  Location: [facility];  Service: Endoscopy;;    Current Medications: No outpatient medications have been marked as taking for the 01/15/21 encounter (Appointment) with O'Neal, Ronnald Ramp, MD.     Allergies:    Benadryl [diphenhydramine hcl (sleep)] and Penicillins   Social History: Social History   Socioeconomic History  . Marital status: Married    Spouse name: Not on file  . Number of children: 2  . Years of education: Not on file  . Highest education level: Not on file  Occupational History  . Not on file  Tobacco Use  . Smoking status: Never Smoker  . Smokeless tobacco: Never Used  Vaping Use  . Vaping Use: Never used  Substance and  Sexual Activity  . Alcohol use: No  . Drug use: No  . Sexual activity: Yes    Birth control/protection: Surgical, Injection    Comment: ablation  Other Topics Concern  . Not on file  Social History Narrative  . Not on file   Social Determinants of Health   Financial Resource Strain: Not on file  Food Insecurity: Not on file  Transportation Needs: Not on file  Physical Activity: Not on file  Stress: Not on file  Social Connections: Not on file     Family History: The patient's ***family history includes Congestive Heart Failure in her sister; Hypertension in her father and mother; Throat cancer (age of onset: 52) in her father. There is no history of Colon cancer or Colon polyps.  ROS:   All other ROS reviewed and negative. Pertinent positives noted in the HPI.     EKGs/Labs/Other Studies Reviewed:   The following studies were personally reviewed by me today:  EKG:  EKG is *** ordered today.  The ekg ordered today demonstrates ***, and was personally reviewed by me.   Recent Labs: 10/09/2020: BNP 12.7; TSH 1.240 01/01/2021: BUN 10; Creatinine, Ser 1.04; Hemoglobin 13.7; Platelets 322; Potassium 3.4; Sodium 139   Recent Lipid Panel No results found for: CHOL, TRIG, HDL, CHOLHDL, VLDL, LDLCALC, LDLDIRECT  Physical Exam:   VS:  There were no vitals taken for this visit.   Wt Readings from Last 3 Encounters:  01/01/21  200 lb (90.7 kg)  10/09/20 204 lb (92.5 kg)  01/21/20 194 lb (88 kg)    General: Well nourished, well developed, in no acute distress Head: Atraumatic, normal size  Eyes: PEERLA, EOMI  Neck: Supple, no JVD Endocrine: No thryomegaly Cardiac: Normal S1, S2; RRR; no murmurs, rubs, or gallops Lungs: Clear to auscultation bilaterally, no wheezing, rhonchi or rales  Abd: Soft, nontender, no hepatomegaly  Ext: No edema, pulses 2+ Musculoskeletal: No deformities, BUE and BLE strength normal and equal Skin: Warm and dry, no rashes   Neuro: Alert and oriented to  person, place, time, and situation, CNII-XII grossly intact, no focal deficits  Psych: Normal mood and affect   ASSESSMENT:   Karla Ward is a 50 y.o. female who presents for the following: No diagnosis found.  PLAN:   There are no diagnoses linked to this encounter.  Disposition: No follow-ups on file.  Medication Adjustments/Labs and Tests Ordered: Current medicines are reviewed at length with the patient today.  Concerns regarding medicines are outlined above.  No orders of the defined types were placed in this encounter.  No orders of the defined types were placed in this encounter.   There are no Patient Instructions on file for this visit.   Time Spent with Patient: I have spent a total of *** minutes with patient reviewing hospital notes, telemetry, EKGs, labs and examining the patient as well as establishing an assessment and plan that was discussed with the patient.  > 50% of time was spent in direct patient care.  Signed, Lenna Gilford. Flora Lipps, MD, [facility]  [facility]  [address] [place], [place] [address] [phone]  01/14/2021 9:24 PM

## 2021-01-15 ENCOUNTER — Ambulatory Visit: Payer: BC Managed Care – PPO | Admitting: Cardiovascular Disease

## 2021-01-15 DIAGNOSIS — R0602 Shortness of breath: Secondary | ICD-10-CM

## 2021-01-15 DIAGNOSIS — R002 Palpitations: Secondary | ICD-10-CM

## 2021-01-15 DIAGNOSIS — I10 Essential (primary) hypertension: Secondary | ICD-10-CM

## 2021-01-28 ENCOUNTER — Ambulatory Visit: Payer: BC Managed Care – PPO | Admitting: Cardiovascular Disease

## 2021-02-11 NOTE — Telephone Encounter (Signed)
Patient declined monitor. Order will be cancelled.

## 2021-03-03 ENCOUNTER — Other Ambulatory Visit: Payer: BC Managed Care – PPO | Admitting: Adult Health

## 2021-03-08 ENCOUNTER — Other Ambulatory Visit: Payer: Self-pay | Admitting: Otolaryngology

## 2021-03-08 DIAGNOSIS — H918X9 Other specified hearing loss, unspecified ear: Secondary | ICD-10-CM

## 2021-03-16 ENCOUNTER — Other Ambulatory Visit: Payer: Self-pay | Admitting: Otolaryngology

## 2021-03-16 DIAGNOSIS — H918X9 Other specified hearing loss, unspecified ear: Secondary | ICD-10-CM

## 2021-03-18 ENCOUNTER — Other Ambulatory Visit: Payer: Self-pay

## 2021-03-18 ENCOUNTER — Ambulatory Visit
Admission: RE | Admit: 2021-03-18 | Discharge: 2021-03-18 | Disposition: A | Discharge status: Home / Self Care | Payer: BC Managed Care – PPO | Source: Ambulatory Visit | Attending: Emergency Medicine | Admitting: Emergency Medicine

## 2021-03-18 VITALS — BP 134/83 | HR 84 | Temp 98.7°F | Resp 18

## 2021-03-18 DIAGNOSIS — J069 Acute upper respiratory infection, unspecified: Secondary | ICD-10-CM | POA: Diagnosis not present

## 2021-03-18 MED ORDER — BENZONATATE 100 MG PO CAPS: 100.0000 mg | ORAL_CAPSULE | Freq: Three times a day (TID) | ORAL | 0 refills | Status: DC

## 2021-03-18 NOTE — Discharge Instructions (Signed)

## 2021-03-18 NOTE — ED Triage Notes (Signed)
Scratchy throat with nasal drainage x 2 days.

## 2021-03-18 NOTE — ED Provider Notes (Signed)
[facility]   [id number] 03/18/21 Arrival Time: 1703   CC: COVID symptoms  SUBJECTIVE: History from: patient.  Karla Ward is a 50 y.o. female who presents with scratchy throat and drainage x 2 days.  Denies sick exposure to COVID, flu or strep.  Has tried OTC medications.  Denies aggravating factors.  Reports previous symptoms in the past.   Denies fever, SOB, wheezing, chest pain, nausea, changes in bowel or bladder habits.    ROS: As per HPI.  All other pertinent ROS negative.     Past Medical History:  Diagnosis Date  . Anemia   . Gastritis   . GERD (gastroesophageal reflux disease)   . Hypertension   . Status post dilation of esophageal narrowing   . Vertigo    Past Surgical History:  Procedure Laterality Date  . DILITATION & CURRETTAGE/HYSTROSCOPY WITH NOVASURE ABLATION N/A 04/05/2017   Procedure: DILATATION & CURETTAGE/HYSTEROSCOPY WITH NOVASURE ENDOMETRIAL ABLATION;  Surgeon: Lazaro Arms, MD;  Location: [facility];  Service: Gynecology;  Laterality: N/A;  . ESOPHAGOGASTRODUODENOSCOPY N/A 09/02/2015   TIR:WERX non-erosive gastritis/stricture at the gastroesophageal junction   . NONE TO DATE  08/13/15  . SAVORY DILATION  09/02/2015   Procedure: SAVORY DILATION;  Surgeon: West Bali, MD;  Location: [facility];  Service: Endoscopy;;   Allergies  Allergen Reactions  . Benadryl [Diphenhydramine Hcl (Sleep)]     tachycardia  . Penicillins Hives    Has patient had a PCN reaction causing immediate rash, facial/tongue/throat swelling, SOB or lightheadedness with hypotension: pt does not remember  Has patient had a PCN reaction causing severe rash involving mucus membranes or skin necrosis: No Has patient had a PCN reaction that required hospitalization No Has patient had a PCN reaction occurring within the last 10 years: No     No current facility-administered medications on file prior to encounter.   Current Outpatient Medications on File Prior  to Encounter  Medication Sig Dispense Refill  . albuterol (PROVENTIL HFA;VENTOLIN HFA) 108 (90 Base) MCG/ACT inhaler Inhale 1-2 puffs into the lungs every 6 (six) hours as needed for wheezing or shortness of breath. 1 Inhaler 0  . amLODipine (NORVASC) 5 MG tablet Take 5 mg by mouth daily.    . Ascorbic Acid (VITAMIN C PO) Take by mouth daily.    Marland Kitchen dicyclomine (BENTYL) 20 MG tablet Take 1 tablet (20 mg total) by mouth 2 (two) times daily as needed for spasms. 20 tablet 0  . medroxyPROGESTERone (DEPO-PROVERA) 150 MG/ML injection INJECT 1 ML (150 MG TOTAL) INTO THE MUSCLE EVERY 3 (THREE) MONTHS. 1 mL 3  . omeprazole (PRILOSEC) 20 MG capsule TAKE (1) CAPSULE BY MOUTH TWICE DAILY 30 MINUTES PRIOR TO MEALS. 60 capsule 11  . ondansetron (ZOFRAN ODT) 4 MG disintegrating tablet Take 1 tablet (4 mg total) by mouth every 8 (eight) hours as needed for nausea or vomiting. 15 tablet 0  . VITAMIN D PO Take by mouth daily.    . Wheat Dextrin (BENEFIBER DRINK MIX) PACK Take by mouth.     Social History   Socioeconomic History  . Marital status: Married    Spouse name: Not on file  . Number of children: 2  . Years of education: Not on file  . Highest education level: Not on file  Occupational History  . Not on file  Tobacco Use  . Smoking status: Never Smoker  . Smokeless tobacco: Never Used  Vaping Use  . Vaping Use: Never used  Substance and Sexual Activity  . Alcohol use: No  . Drug use: No  . Sexual activity: Yes    Birth control/protection: Surgical, Injection    Comment: ablation  Other Topics Concern  . Not on file  Social History Narrative  . Not on file   Social Determinants of Health   Financial Resource Strain: Not on file  Food Insecurity: Not on file  Transportation Needs: Not on file  Physical Activity: Not on file  Stress: Not on file  Social Connections: Not on file  Intimate Partner Violence: Not on file   Family History  Problem Relation Age of Onset  . Throat cancer  Father 16  . Hypertension Father   . Hypertension Mother   . Congestive Heart Failure Sister   . Colon cancer Neg Hx   . Colon polyps Neg Hx     OBJECTIVE:  Vitals:   03/18/21 1747  BP: 134/83  Pulse: 84  Resp: 18  Temp: 98.7 F (37.1 C)  TempSrc: Oral  SpO2: 98%     General appearance: alert; well-appearing, nontoxic; speaking in full sentences and tolerating own secretions HEENT: NCAT; Ears: EACs clear, TMs pearly gray; Eyes: PERRL.  EOM grossly intact.Nose: nares patent without rhinorrhea, turbinates swollen and erythematous Throat: oropharynx clear, tonsils non erythematous or enlarged, uvula midline  Neck: supple without LAD Lungs: unlabored respirations, symmetrical air entry; cough: absent; no respiratory distress; CTAB Heart: regular rate and rhythm.  Skin: warm and dry Psychological: alert and cooperative; normal mood and affect    ASSESSMENT & PLAN:  1. Viral URI with cough     Meds ordered this encounter  Medications  . benzonatate (TESSALON) 100 MG capsule    Sig: Take 1 capsule (100 mg total) by mouth every 8 (eight) hours.    Dispense:  21 capsule    Refill:  0    Order Specific Question:   Supervising Provider    Answer:   Eustace Moore [[id number]]   COVID testing ordered.  It will take between 5-7 days for test results.  Someone will contact you regarding abnormal results.    In the meantime: You should remain isolated in your home for 10 days from symptom onset AND greater than 72 hours after symptoms resolution (absence of fever without the use of fever-reducing medication and improvement in respiratory symptoms), whichever is longer Get plenty of rest and push fluids Tessalon Perles prescribed for cough Use OTC zyrtec for nasal congestion, runny nose, and/or sore throat Use OTC flonase for nasal congestion and runny nose Use medications daily for symptom relief Use OTC medications like ibuprofen or tylenol as needed fever or pain Call or go  to the ED if you have any new or worsening symptoms such as fever, worsening cough, shortness of breath, chest tightness, chest pain, turning blue, changes in mental status, etc...   Reviewed expectations re: course of current medical issues. Questions answered. Outlined signs and symptoms indicating need for more acute intervention. Patient verbalized understanding. After Visit Summary given.         Rennis Harding, PA-C 03/18/21 1810

## 2021-03-19 LAB — COVID-19, FLU A+B NAA
Influenza A, NAA: NOT DETECTED
Influenza B, NAA: NOT DETECTED
SARS-CoV-2, NAA: NOT DETECTED

## 2021-03-23 ENCOUNTER — Other Ambulatory Visit: Payer: BC Managed Care – PPO

## 2021-03-25 ENCOUNTER — Telehealth: Payer: Self-pay | Admitting: Adult Health

## 2021-03-25 MED ORDER — MEDROXYPROGESTERONE ACETATE 150 MG/ML IM SUSP: 150.0000 mg | INTRAMUSCULAR | 3 refills | Status: DC

## 2021-03-25 NOTE — Telephone Encounter (Signed)
Pt missed her PAP & depo on 5/4 Depo deadline was 5/18  Please advise on what to scheduled & when to re-start depo

## 2021-03-25 NOTE — Telephone Encounter (Signed)
Will refill depo, has missed 2 injections,has sex 03/15/21, advised no sex and come next week for injection

## 2021-03-31 ENCOUNTER — Ambulatory Visit (INDEPENDENT_AMBULATORY_CARE_PROVIDER_SITE_OTHER): Payer: BC Managed Care – PPO | Admitting: *Deleted

## 2021-03-31 ENCOUNTER — Other Ambulatory Visit: Payer: Self-pay

## 2021-03-31 DIAGNOSIS — Z3202 Encounter for pregnancy test, result negative: Secondary | ICD-10-CM

## 2021-03-31 DIAGNOSIS — Z308 Encounter for other contraceptive management: Secondary | ICD-10-CM | POA: Diagnosis not present

## 2021-03-31 LAB — POCT URINE PREGNANCY: Preg Test, Ur: NEGATIVE

## 2021-03-31 MED ORDER — MEDROXYPROGESTERONE ACETATE 150 MG/ML IM SUSP
150.0000 mg | Freq: Once | INTRAMUSCULAR | Status: AC
  Administered 2021-03-31: 150 mg via INTRAMUSCULAR

## 2021-03-31 NOTE — Progress Notes (Signed)
   NURSE VISIT- INJECTION  SUBJECTIVE:  Karla Ward is a 50 y.o. [phone] female here for a Depo Provera for contraception/period management. She is a GYN patient. Pt had a negative UPT in office since she is late getting Depo.   OBJECTIVE:  There were no vitals taken for this visit.  Appears well, in no apparent distress  Injection administered in: Right deltoid  Meds ordered this encounter  Medications  . medroxyPROGESTERone (DEPO-PROVERA) injection 150 mg    ASSESSMENT: GYN patient Depo Provera for contraception/period management PLAN: Follow-up: in 11-13 weeks for next Depo   Malachy Mood  03/31/2021 4:14 PM

## 2021-04-04 ENCOUNTER — Other Ambulatory Visit: Payer: Self-pay

## 2021-04-04 ENCOUNTER — Ambulatory Visit
Admission: RE | Admit: 2021-04-04 | Discharge: 2021-04-04 | Disposition: A | Discharge status: Home / Self Care | Payer: BC Managed Care – PPO | Source: Ambulatory Visit | Attending: Otolaryngology | Admitting: Otolaryngology

## 2021-04-04 ENCOUNTER — Other Ambulatory Visit: Payer: BC Managed Care – PPO

## 2021-04-04 DIAGNOSIS — H918X9 Other specified hearing loss, unspecified ear: Secondary | ICD-10-CM

## 2021-04-18 ENCOUNTER — Ambulatory Visit
Admission: RE | Admit: 2021-04-18 | Discharge: 2021-04-18 | Disposition: A | Discharge status: Home / Self Care | Payer: BC Managed Care – PPO | Source: Ambulatory Visit | Attending: Family Medicine | Admitting: Family Medicine

## 2021-04-18 ENCOUNTER — Other Ambulatory Visit: Payer: Self-pay

## 2021-04-18 VITALS — BP 130/86 | HR 99 | Temp 101.1°F | Resp 18

## 2021-04-18 DIAGNOSIS — R519 Headache, unspecified: Secondary | ICD-10-CM

## 2021-04-18 DIAGNOSIS — R509 Fever, unspecified: Secondary | ICD-10-CM | POA: Diagnosis not present

## 2021-04-18 DIAGNOSIS — Z20822 Contact with and (suspected) exposure to covid-19: Secondary | ICD-10-CM

## 2021-04-18 DIAGNOSIS — B349 Viral infection, unspecified: Secondary | ICD-10-CM

## 2021-04-18 MED ORDER — ACETAMINOPHEN 325 MG PO TABS
650.0000 mg | ORAL_TABLET | Freq: Once | ORAL | Status: AC
  Administered 2021-04-18: 650 mg via ORAL

## 2021-04-18 MED ORDER — PROMETHAZINE-DM 6.25-15 MG/5ML PO SYRP: 5.0000 mL | ORAL_SOLUTION | Freq: Four times a day (QID) | ORAL | 0 refills | Status: DC | PRN

## 2021-04-18 MED ORDER — TIZANIDINE HCL 4 MG PO TABS: 4.0000 mg | ORAL_TABLET | Freq: Three times a day (TID) | ORAL | 0 refills | Status: DC | PRN

## 2021-04-18 NOTE — Discharge Instructions (Addendum)
Your COVID 19 results should result within 3-5 days. Negative results are immediately resulted to Mychart. Positive results will receive a follow-up call from our clinic. If symptoms are present, I recommend home quarantine until results are known.  Alternate Tylenol and Naproxen (Aleve)  as needed for body aches and fever.  Symptom management per recommendations discussed today.  If any breathing difficulty or chest pain develops go immediately to the closest emergency department for evaluation.

## 2021-04-18 NOTE — ED Provider Notes (Signed)
[facility]    CSN: [id number] Arrival date & time: 04/18/21  1204      History   Chief Complaint Chief Complaint  Patient presents with   Fever    HPI Karla Ward is a 50 y.o. female.   HPI Patient presents today with body aches, fatigue, cough and runny nose x3 days.  She is febrile on arrival. Denies any wheezing or shortness of breath.  No known exposure to COVID. She  is unavaccinated against COVID  Past Medical History:  Diagnosis Date   Anemia    Gastritis    GERD (gastroesophageal reflux disease)    Hypertension    Status post dilation of esophageal narrowing    Vertigo     Patient Active Problem List   Diagnosis Date Noted   Encounter for colorectal cancer screening 01/20/2020   Encounter for gynecological examination with Papanicolaou smear of cervix 01/20/2020   Colon cancer screening 07/06/2017   N&V (nausea and vomiting) 09/14/2016   Menorrhagia 05/30/2016   GERD (gastroesophageal reflux disease) 08/13/2015   Palpitations 11/22/2012    Past Surgical History:  Procedure Laterality Date   DILITATION & CURRETTAGE/HYSTROSCOPY WITH NOVASURE ABLATION N/A 04/05/2017   Procedure: DILATATION & CURETTAGE/HYSTEROSCOPY WITH NOVASURE ENDOMETRIAL ABLATION;  Surgeon: Lazaro Arms, MD;  Location: [facility];  Service: Gynecology;  Laterality: N/A;   ESOPHAGOGASTRODUODENOSCOPY N/A 09/02/2015   AYT:KZSW non-erosive gastritis/stricture at the gastroesophageal junction    NONE TO DATE  08/13/15   SAVORY DILATION  09/02/2015   Procedure: SAVORY DILATION;  Surgeon: West Bali, MD;  Location: [facility];  Service: Endoscopy;;    OB History     Gravida  2   Para  2   Term      Preterm  2   AB      Living  2      SAB      IAB      Ectopic      Multiple      Live Births  2            Home Medications    Prior to Admission medications   Medication Sig Start Date End Date Taking? Authorizing Provider   promethazine-dextromethorphan (PROMETHAZINE-DM) 6.25-15 MG/5ML syrup Take 5 mLs by mouth 4 (four) times daily as needed for cough. 04/18/21  Yes Bing Neighbors, FNP  tiZANidine (ZANAFLEX) 4 MG tablet Take 1 tablet (4 mg total) by mouth every 8 (eight) hours as needed for muscle spasms (Bodyaches and headache). 04/18/21  Yes Bing Neighbors, FNP  albuterol (PROVENTIL HFA;VENTOLIN HFA) 108 (90 Base) MCG/ACT inhaler Inhale 1-2 puffs into the lungs every 6 (six) hours as needed for wheezing or shortness of breath. 10/28/17   Couture, Cortni S, PA-C  amLODipine (NORVASC) 5 MG tablet Take 5 mg by mouth daily.    [provider]  Ascorbic Acid (VITAMIN C PO) Take by mouth daily.    [provider]  dicyclomine (BENTYL) 20 MG tablet Take 1 tablet (20 mg total) by mouth 2 (two) times daily as needed for spasms. 12/29/18   Caccavale, Sophia, PA-C  medroxyPROGESTERone (DEPO-PROVERA) 150 MG/ML injection Inject 1 mL (150 mg total) into the muscle every 3 (three) months. 03/25/21   Adline Potter, NP  omeprazole (PRILOSEC) 20 MG capsule TAKE (1) CAPSULE BY MOUTH TWICE DAILY 30 MINUTES PRIOR TO MEALS. 02/21/20   Anice Paganini, NP  ondansetron (ZOFRAN ODT) 4 MG disintegrating tablet Take 1  tablet (4 mg total) by mouth every 8 (eight) hours as needed for nausea or vomiting. 12/29/18   Caccavale, Sophia, PA-C  VITAMIN D PO Take by mouth daily.    [provider]  Wheat Dextrin (BENEFIBER DRINK MIX) PACK Take by mouth.    [provider]    Family History Family History  Problem Relation Age of Onset   Throat cancer Father 37   Hypertension Father    Hypertension Mother    Congestive Heart Failure Sister    Colon cancer Neg Hx    Colon polyps Neg Hx     Social History Social History   Tobacco Use   Smoking status: Never   Smokeless tobacco: Never  Vaping Use   Vaping Use: Never used  Substance Use Topics   Alcohol use: No   Drug use: No     Allergies    Benadryl [diphenhydramine hcl (sleep)] and Penicillins   Review of Systems Review of Systems   Physical Exam Triage Vital Signs ED Triage Vitals  Enc Vitals Group     BP 04/18/21 1225 130/86     Pulse Rate 04/18/21 1225 99     Resp 04/18/21 1225 18     Temp 04/18/21 1225 (!) 101.1 F (38.4 C)     Temp Source 04/18/21 1225 Tympanic     SpO2 04/18/21 1225 98 %     Weight --      Height --      Head Circumference --      Peak Flow --      Pain Score 04/18/21 1229 10     Pain Loc --      Pain Edu? --      Excl. in GC? --    No data found.  Updated Vital Signs BP 130/86 (BP Location: Right Arm)   Pulse 99   Temp (!) 101.1 F (38.4 C) (Tympanic)   Resp 18   SpO2 98%   Visual Acuity Right Eye Distance:   Left Eye Distance:   Bilateral Distance:    Right Eye Near:   Left Eye Near:    Bilateral Near:     Physical Exam  General Appearance:    Alert, acutely ill appearing, cooperative, no distress  HENT:   Normocephalic, ears normal, nares mucosal edema with congestion, rhinorrhea, oropharynx    Eyes:    PERRL, conjunctiva/corneas clear, EOM's intact       Lungs:     Clear to auscultation bilaterally, respirations unlabored  Heart:    Regular rate and rhythm  Neurologic:   Awake, alert, oriented x 3. No apparent focal neurological deficit      UC Treatments / Results  Labs (all labs ordered are listed, but only abnormal results are displayed) Labs Reviewed  COVID-19, FLU A+B NAA - Abnormal; Notable for the following components:      Result Value   SARS-CoV-2, NAA Detected (*)    All other components within normal limits   Narrative:    Performed at:  [address] [place], [place]  [id number] Lab Director: Jolene Schimke MD, Phone:  [phone]    EKG   Radiology No results found.  Procedures Procedures (including critical care time)  Medications Ordered in UC Medications  acetaminophen (TYLENOL) tablet 650 mg (650 mg  Oral Given 04/18/21 1234)    Initial Impression / Assessment and Plan / UC Course  I have reviewed the triage vital signs and the nursing notes.  Pertinent labs & imaging results that were available during my care of the patient were reviewed by me and considered in my medical decision making (see chart for details).     COVID/Flu test pending. Symptom management warranted only.  Manage fever with Tylenol and ibuprofen.  Nasal symptoms with over-the-counter antihistamines recommended.  Treatment per discharge medications/discharge instructions.  Red flags/ER precautions given. The most current CDC isolation/quarantine recommendation advised.   Final diagnoses:  Fever, unspecified  Suspected COVID-19 virus infection  Viral illness  Generalized headache     Discharge Instructions      Your COVID 19 results should result within 3-5 days. Negative results are immediately resulted to Mychart. Positive results will receive a follow-up call from our clinic. If symptoms are present, I recommend home quarantine until results are known.  Alternate Tylenol and Naproxen (Aleve)  as needed for body aches and fever.  Symptom management per recommendations discussed today.  If any breathing difficulty or chest pain develops go immediately to the closest emergency department for evaluation.      ED Prescriptions     Medication Sig Dispense Auth. Provider   tiZANidine (ZANAFLEX) 4 MG tablet Take 1 tablet (4 mg total) by mouth every 8 (eight) hours as needed for muscle spasms (Bodyaches and headache). 30 tablet Bing Neighbors, FNP   promethazine-dextromethorphan (PROMETHAZINE-DM) 6.25-15 MG/5ML syrup Take 5 mLs by mouth 4 (four) times daily as needed for cough. 140 mL Bing Neighbors, FNP      PDMP not reviewed this encounter.   Bing Neighbors, [place] 04/21/21 [phone]

## 2021-04-18 NOTE — ED Triage Notes (Signed)
Body aches, fatigue, cough, runny nose and fever that continues to get worse since Friday.

## 2021-04-20 LAB — COVID-19, FLU A+B NAA
Influenza A, NAA: NOT DETECTED
Influenza B, NAA: NOT DETECTED
SARS-CoV-2, NAA: DETECTED — AB

## 2021-04-29 ENCOUNTER — Encounter: Payer: Self-pay | Admitting: *Deleted

## 2021-05-04 ENCOUNTER — Ambulatory Visit: Payer: BC Managed Care – PPO | Admitting: Diagnostic Neuroimaging

## 2021-05-04 ENCOUNTER — Encounter: Payer: Self-pay | Admitting: Diagnostic Neuroimaging

## 2021-05-04 VITALS — BP 130/81 | HR 95 | Ht 64.0 in | Wt 203.0 lb

## 2021-05-04 DIAGNOSIS — H8103 Meniere's disease, bilateral: Secondary | ICD-10-CM

## 2021-05-04 NOTE — Progress Notes (Signed)
[facility]  PATIENT: Divya Munshi DOB: 10-Feb-1971  REFERRING CLINICIAN: Dillard Essex, NP HISTORY FROM: patient  REASON FOR VISIT: new consult    HISTORICAL  CHIEF COMPLAINT:  Chief Complaint  Patient presents with   Follow-up    RM 6 alone Pt is well, here to see if she can get help with her vertigo symptoms. She has them server and rate it 10/10.     HISTORY OF PRESENT ILLNESS:   50 year old female here for evaluation of vertigo.  Symptoms started around 2009 and patient saw ENT for evaluation.  She had MRI of the brain was unremarkable.  She was diagnosed with Mnire's disease.  Patient has had tinnitus and hearing loss over the years which has fluctuated.  She is tried meclizine without relief.  Symptoms have been worsening lately and she saw ENT again who ordered MRI of the brain without contrast in 2022 which was unremarkable except for subtle changes in the left IAC.  Postcontrast imaging was recommended.   REVIEW OF SYSTEMS: Full 14 system review of systems performed and negative with exception of: as per HPI.  ALLERGIES: Allergies  Allergen Reactions   Benadryl [Diphenhydramine Hcl (Sleep)]     tachycardia   Ciprofloxacin    Ibuprofen    Penicillins Hives    Has patient had a PCN reaction causing immediate rash, facial/tongue/throat swelling, SOB or lightheadedness with hypotension: pt does not remember  Has patient had a PCN reaction causing severe rash involving mucus membranes or skin necrosis: No Has patient had a PCN reaction that required hospitalization No Has patient had a PCN reaction occurring within the last 10 years: No     Vitamin D Analogs     HOME MEDICATIONS: Outpatient Medications Prior to Visit  Medication Sig Dispense Refill   amLODipine (NORVASC) 5 MG tablet Take 5 mg by mouth daily.     medroxyPROGESTERone (DEPO-PROVERA) 150 MG/ML injection Inject 1 mL (150 mg total) into the muscle every 3 (three)  months. 1 mL 3   omeprazole (PRILOSEC) 20 MG capsule TAKE (1) CAPSULE BY MOUTH TWICE DAILY 30 MINUTES PRIOR TO MEALS. 60 capsule 11   ondansetron (ZOFRAN ODT) 4 MG disintegrating tablet Take 1 tablet (4 mg total) by mouth every 8 (eight) hours as needed for nausea or vomiting. 15 tablet 0   albuterol (PROVENTIL HFA;VENTOLIN HFA) 108 (90 Base) MCG/ACT inhaler Inhale 1-2 puffs into the lungs every 6 (six) hours as needed for wheezing or shortness of breath. 1 Inhaler 0   Ascorbic Acid (VITAMIN C PO) Take by mouth daily.     dicyclomine (BENTYL) 20 MG tablet Take 1 tablet (20 mg total) by mouth 2 (two) times daily as needed for spasms. 20 tablet 0   promethazine-dextromethorphan (PROMETHAZINE-DM) 6.25-15 MG/5ML syrup Take 5 mLs by mouth 4 (four) times daily as needed for cough. 140 mL 0   tiZANidine (ZANAFLEX) 4 MG tablet Take 1 tablet (4 mg total) by mouth every 8 (eight) hours as needed for muscle spasms (Bodyaches and headache). 30 tablet 0   VITAMIN D PO Take by mouth daily.     Wheat Dextrin (BENEFIBER DRINK MIX) PACK Take by mouth.     No facility-administered medications prior to visit.    PAST MEDICAL HISTORY: Past Medical History:  Diagnosis Date   Anemia    Dizziness    Gastritis    GERD (gastroesophageal reflux disease)    Hypertension    Status post dilation of esophageal narrowing  Vertigo     PAST SURGICAL HISTORY: Past Surgical History:  Procedure Laterality Date   DILITATION & CURRETTAGE/HYSTROSCOPY WITH NOVASURE ABLATION N/A 04/05/2017   Procedure: DILATATION & CURETTAGE/HYSTEROSCOPY WITH NOVASURE ENDOMETRIAL ABLATION;  Surgeon: Lazaro Arms, MD;  Location: [facility];  Service: Gynecology;  Laterality: N/A;   ESOPHAGOGASTRODUODENOSCOPY N/A 09/02/2015   VZC:HYIF non-erosive gastritis/stricture at the gastroesophageal junction    NONE TO DATE  08/13/15   SAVORY DILATION  09/02/2015   Procedure: SAVORY DILATION;  Surgeon: West Bali, MD;  Location: [facility];   Service: Endoscopy;;    FAMILY HISTORY: Family History  Problem Relation Age of Onset   Throat cancer Father 83   Hypertension Father    Hypertension Mother    Congestive Heart Failure Sister    Colon cancer Neg Hx    Colon polyps Neg Hx     SOCIAL HISTORY: Social History   Socioeconomic History   Marital status: Married    Spouse name: Not on file   Number of children: 2   Years of education: Not on file   Highest education level: Not on file  Occupational History   Not on file  Tobacco Use   Smoking status: Never   Smokeless tobacco: Never  Vaping Use   Vaping Use: Never used  Substance and Sexual Activity   Alcohol use: No   Drug use: No   Sexual activity: Yes    Birth control/protection: Surgical, Injection    Comment: ablation  Other Topics Concern   Not on file  Social History Narrative   Lives with husband   Social Determinants of Health   Financial Resource Strain: Not on file  Food Insecurity: Not on file  Transportation Needs: Not on file  Physical Activity: Not on file  Stress: Not on file  Social Connections: Not on file  Intimate Partner Violence: Not on file     PHYSICAL EXAM  GENERAL EXAM/CONSTITUTIONAL: Vitals:  Vitals:   05/04/21 1529  BP: 130/81  Pulse: 95  Weight: 203 lb (92.1 kg)  Height: 5\' 4"  (1.626 m)   Body mass index is 34.84 kg/m. Wt Readings from Last 3 Encounters:  05/04/21 203 lb (92.1 kg)  01/01/21 200 lb (90.7 kg)  10/09/20 204 lb (92.5 kg)   Patient is in no distress; well developed, nourished and groomed; neck is supple  CARDIOVASCULAR: Examination of carotid arteries is normal; no carotid bruits Regular rate and rhythm, no murmurs Examination of peripheral vascular system by observation and palpation is normal  EYES: Ophthalmoscopic exam of optic discs and posterior segments is normal; no papilledema or hemorrhages No results found.  MUSCULOSKELETAL: Gait, strength, tone, movements noted in Neurologic  exam below  NEUROLOGIC: MENTAL STATUS:  No flowsheet data found. awake, alert, oriented to person, place and time recent and remote memory intact normal attention and concentration language fluent, comprehension intact, naming intact fund of knowledge appropriate  CRANIAL NERVE:  2nd - no papilledema on fundoscopic exam 2nd, 3rd, 4th, 6th - pupils equal and reactive to light, visual fields full to confrontation, extraocular muscles intact, no nystagmus 5th - facial sensation symmetric 7th - facial strength symmetric 8th - hearing intact 9th - palate elevates symmetrically, uvula midline 11th - shoulder shrug symmetric 12th - tongue protrusion midline  MOTOR:  normal bulk and tone, full strength in the BUE, BLE  SENSORY:  normal and symmetric to light touch, temperature, vibration  COORDINATION:  finger-nose-finger, fine finger movements normal  REFLEXES:  deep  tendon reflexes present and symmetric  GAIT/STATION:  narrow based gait     DIAGNOSTIC DATA (LABS, IMAGING, TESTING) - I reviewed patient records, labs, notes, testing and imaging myself where available.  Lab Results  Component Value Date   WBC 13.1 (H) 01/01/2021   HGB 13.7 01/01/2021   HCT 42.5 01/01/2021   MCV 90.2 01/01/2021   PLT 322 01/01/2021      Component Value Date/Time   NA 139 01/01/2021 0114   K 3.4 (L) 01/01/2021 0114   CL 108 01/01/2021 0114   CO2 21 (L) 01/01/2021 0114   GLUCOSE 100 (H) 01/01/2021 0114   BUN 10 01/01/2021 0114   CREATININE 1.04 (H) 01/01/2021 0114   CALCIUM 8.9 01/01/2021 0114   PROT 6.7 01/11/2019 0932   ALBUMIN 3.6 01/11/2019 0932   AST 18 01/11/2019 0932   ALT 20 01/11/2019 0932   ALKPHOS 47 01/11/2019 0932   BILITOT 0.7 01/11/2019 0932   GFRNONAA >60 01/01/2021 0114   GFRAA >60 01/11/2019 0932   No results found for: CHOL, HDL, LDLCALC, LDLDIRECT, TRIG, CHOLHDL No results found for: WNUU7O No results found for: VITAMINB12 Lab Results  Component Value  Date   TSH 1.240 10/09/2020     04/04/21 MRI brain (without) [I reviewed images myself and agree with interpretation. -VRP]  - Normal appearance of the brain. This study was ordered and performed without contrast. High-resolution T2 imaging raises the question of slight thickening of the seventh and 8th nerve complex in the internal auditory canal on the left. Particularly if this is the side of symptoms, a small vestibular schwannoma is not excluded and could be evaluated more accurately with postcontrast imaging.    ASSESSMENT AND PLAN  50 y.o. year old female here with:  Dx:  1. Meniere's disease of both ears     PLAN:  MENIERE'S DISEASE (vertigo, tinnitus; since ~2009) - follow up with ENT; consider post-contrast IAC protocol MRI; no other neurologic etiologies found  Return for return to PCP, pending if symptoms worsen or fail to improve.    Suanne Marker, MD 05/04/2021, 4:05 PM Certified in Neurology, Neurophysiology and Neuroimaging  [facility] [address], [address] [place], [place] [address] [phone]

## 2021-05-04 NOTE — Patient Instructions (Signed)
  MENIERE'S DISEASE (vertigo, tinnitus; since ~2009) - follow up with ENT; consider post-contrast MRI; no other neurologic etiologies found

## 2021-05-05 ENCOUNTER — Telehealth: Payer: Self-pay | Admitting: Diagnostic Neuroimaging

## 2021-05-05 NOTE — Telephone Encounter (Signed)
LVM asking pt to return my call  We see no reason as to why she couldn't have dental treatment done, if her dentist needed clearance for any reason, they would contact us and let us know but we have no concerns for it at this times.

## 2021-05-05 NOTE — Telephone Encounter (Signed)
Pt requesting a call back, wants to know if it is okay to have any dental treatment. Please advise.

## 2021-05-05 NOTE — Telephone Encounter (Signed)
FYI, message from  [place], [place] was relayed to pt with no questions or request for a call back.

## 2021-05-06 ENCOUNTER — Other Ambulatory Visit: Payer: Self-pay

## 2021-05-06 ENCOUNTER — Ambulatory Visit
Admission: RE | Admit: 2021-05-06 | Discharge: 2021-05-06 | Disposition: A | Discharge status: Home / Self Care | Payer: BC Managed Care – PPO | Source: Ambulatory Visit | Attending: Emergency Medicine | Admitting: Emergency Medicine

## 2021-05-06 VITALS — BP 125/72 | HR 98 | Temp 98.7°F | Resp 16

## 2021-05-06 DIAGNOSIS — H9203 Otalgia, bilateral: Secondary | ICD-10-CM | POA: Diagnosis not present

## 2021-05-06 MED ORDER — PREDNISONE 20 MG PO TABS: 20.0000 mg | ORAL_TABLET | Freq: Two times a day (BID) | ORAL | 0 refills | Status: AC

## 2021-05-06 NOTE — Discharge Instructions (Addendum)
Rest and drink plenty of fluids Steroid prescribed for possible meniere's disease flare or eustachian tube dysfunction You may also take allergy medication and/or flonase Continue to use OTC ibuprofen and/ or tylenol as needed for pain control Follow up with ENT next week for recheck Return here or go to the ER if you have any new or worsening symptoms

## 2021-05-06 NOTE — ED Provider Notes (Signed)
[facility]   [id number] 05/06/21 Arrival Time: 1752  CC:EAR PAIN  SUBJECTIVE: History from: patient.  Karla Ward is a 50 y.o. female who presents with of bilateral ear pain x 2 days.  Denies a precipitating event, such as swimming or wearing ear plugs.  Patient states the pain is intermittent and achy in character.  Patient has tried OTC medications without relief.  Denies aggravating factors.  Reports similar symptoms in the past with fluid behind the ears    Denies fever, chills, fatigue, sinus pain, rhinorrhea, ear discharge, sore throat, SOB, wheezing, chest pain, nausea, changes in bowel or bladder habits.    ROS: As per HPI.  All other pertinent ROS negative.     Past Medical History:  Diagnosis Date   Anemia    Dizziness    Gastritis    GERD (gastroesophageal reflux disease)    Hypertension    Status post dilation of esophageal narrowing    Vertigo    Past Surgical History:  Procedure Laterality Date   DILITATION & CURRETTAGE/HYSTROSCOPY WITH NOVASURE ABLATION N/A 04/05/2017   Procedure: DILATATION & CURETTAGE/HYSTEROSCOPY WITH NOVASURE ENDOMETRIAL ABLATION;  Surgeon: Lazaro Arms, MD;  Location: [facility];  Service: Gynecology;  Laterality: N/A;   ESOPHAGOGASTRODUODENOSCOPY N/A 09/02/2015   NVB:TYOM non-erosive gastritis/stricture at the gastroesophageal junction    NONE TO DATE  08/13/15   SAVORY DILATION  09/02/2015   Procedure: SAVORY DILATION;  Surgeon: West Bali, MD;  Location: [facility];  Service: Endoscopy;;   Allergies  Allergen Reactions   Benadryl [Diphenhydramine Hcl (Sleep)]     tachycardia   Ciprofloxacin    Ibuprofen    Penicillins Hives    Has patient had a PCN reaction causing immediate rash, facial/tongue/throat swelling, SOB or lightheadedness with hypotension: pt does not remember  Has patient had a PCN reaction causing severe rash involving mucus membranes or skin necrosis: No Has patient had a PCN reaction that  required hospitalization No Has patient had a PCN reaction occurring within the last 10 years: No     Vitamin D Analogs    No current facility-administered medications on file prior to encounter.   Current Outpatient Medications on File Prior to Encounter  Medication Sig Dispense Refill   amLODipine (NORVASC) 5 MG tablet Take 5 mg by mouth daily.     medroxyPROGESTERone (DEPO-PROVERA) 150 MG/ML injection Inject 1 mL (150 mg total) into the muscle every 3 (three) months. 1 mL 3   omeprazole (PRILOSEC) 20 MG capsule TAKE (1) CAPSULE BY MOUTH TWICE DAILY 30 MINUTES PRIOR TO MEALS. 60 capsule 11   ondansetron (ZOFRAN ODT) 4 MG disintegrating tablet Take 1 tablet (4 mg total) by mouth every 8 (eight) hours as needed for nausea or vomiting. 15 tablet 0   Social History   Socioeconomic History   Marital status: Married    Spouse name: Not on file   Number of children: 2   Years of education: Not on file   Highest education level: Not on file  Occupational History   Not on file  Tobacco Use   Smoking status: Never   Smokeless tobacco: Never  Vaping Use   Vaping Use: Never used  Substance and Sexual Activity   Alcohol use: No   Drug use: No   Sexual activity: Yes    Birth control/protection: Surgical, Injection    Comment: ablation  Other Topics Concern   Not on file  Social History Narrative   Lives with husband  Social Determinants of Health   Financial Resource Strain: Not on file  Food Insecurity: Not on file  Transportation Needs: Not on file  Physical Activity: Not on file  Stress: Not on file  Social Connections: Not on file  Intimate Partner Violence: Not on file   Family History  Problem Relation Age of Onset   Throat cancer Father 59   Hypertension Father    Hypertension Mother    Congestive Heart Failure Sister    Colon cancer Neg Hx    Colon polyps Neg Hx     OBJECTIVE:  Vitals:   05/06/21 1803  BP: 125/72  Pulse: 98  Resp: 16  Temp: 98.7 F  (37.1 C)  TempSrc: Tympanic  SpO2: 99%    General appearance: alert; well-appearing, nontoxic; speaking in full sentences and tolerating own secretions HEENT: NCAT; Ears: EACs clear, TMs pearly gray; Eyes: PERRL.  EOM grossly intact.Nose: nares patent without rhinorrhea, Throat: oropharynx clear, tonsils non erythematous or enlarged, uvula midline  Neck: supple without LAD Lungs: unlabored respirations, symmetrical air entry; cough: absent; no respiratory distress; CTAB Heart: regular rate and rhythm.  Skin: warm and dry Psychological: alert and cooperative; normal mood and affect    ASSESSMENT & PLAN:  1. Pain in ear, bilateral     Meds ordered this encounter  Medications   predniSONE (DELTASONE) 20 MG tablet    Sig: Take 1 tablet (20 mg total) by mouth 2 (two) times daily with a meal for 5 days.    Dispense:  10 tablet    Refill:  0    Order Specific Question:   Supervising Provider    Answer:   Eustace Moore [[id number]]    Rest and drink plenty of fluids Steroid prescribed for possible meniere's disease flare or eustachian tube dysfunction You may also take allergy medication and/or flonase Continue to use OTC ibuprofen and/ or tylenol as needed for pain control Follow up with ENT next week for recheck Return here or go to the ER if you have any new or worsening symptoms   Reviewed expectations re: course of current medical issues. Questions answered. Outlined signs and symptoms indicating need for more acute intervention. Patient verbalized understanding. After Visit Summary given.          Rennis Harding, PA-C 05/06/21 [id number]

## 2021-05-06 NOTE — ED Triage Notes (Signed)
Bilateral ear pain x 2 days

## 2021-05-24 ENCOUNTER — Telehealth: Payer: Self-pay | Admitting: Diagnostic Neuroimaging

## 2021-05-24 NOTE — Telephone Encounter (Signed)
PHONE RM CAN RELAY  Returned pt call, VM full.  Spoken to pt earlier in the month 05/05/21 and LVM. Karla Ward relayed my message informing her  We see no reason as to why she couldn't have dental treatment done, if her dentist needed clearance for any reason, they would contact us directly and let us know but we have no concerns for it at this time.

## 2021-05-24 NOTE — Telephone Encounter (Signed)
Pt return phone, transferred to nurse

## 2021-05-24 NOTE — Telephone Encounter (Signed)
Pt called, need clearance for dentist to put in a bridge. Would like a call from the nurse.

## 2021-05-24 NOTE — Telephone Encounter (Signed)
Spoke to pt, informed her that if another office needs clearance they will request the information from [place] directly and send [place] a form to fill out.  Gave pt fax number to send forms if needed.

## 2021-05-31 ENCOUNTER — Ambulatory Visit: Payer: BC Managed Care – PPO | Admitting: Diagnostic Neuroimaging

## 2021-06-21 ENCOUNTER — Ambulatory Visit (INDEPENDENT_AMBULATORY_CARE_PROVIDER_SITE_OTHER): Payer: BC Managed Care – PPO | Admitting: Adult Health

## 2021-06-21 ENCOUNTER — Ambulatory Visit (INDEPENDENT_AMBULATORY_CARE_PROVIDER_SITE_OTHER): Payer: BC Managed Care – PPO

## 2021-06-21 ENCOUNTER — Other Ambulatory Visit: Payer: Self-pay

## 2021-06-21 ENCOUNTER — Encounter: Payer: Self-pay | Admitting: Adult Health

## 2021-06-21 VITALS — BP 130/80 | HR 78 | Ht 62.5 in | Wt 197.0 lb

## 2021-06-21 DIAGNOSIS — Z1211 Encounter for screening for malignant neoplasm of colon: Secondary | ICD-10-CM | POA: Diagnosis not present

## 2021-06-21 DIAGNOSIS — Z3042 Encounter for surveillance of injectable contraceptive: Secondary | ICD-10-CM | POA: Diagnosis not present

## 2021-06-21 DIAGNOSIS — Z01419 Encounter for gynecological examination (general) (routine) without abnormal findings: Secondary | ICD-10-CM

## 2021-06-21 DIAGNOSIS — R87610 Atypical squamous cells of undetermined significance on cytologic smear of cervix (ASC-US): Secondary | ICD-10-CM | POA: Diagnosis not present

## 2021-06-21 LAB — HEMOCCULT GUIAC POC 1CARD (OFFICE): Fecal Occult Blood, POC: NEGATIVE

## 2021-06-21 MED ORDER — CLOTRIMAZOLE-BETAMETHASONE 1-0.05 % EX CREA: 1.0000 "application " | TOPICAL_CREAM | Freq: Two times a day (BID) | CUTANEOUS | 2 refills | Status: DC

## 2021-06-21 MED ORDER — MEDROXYPROGESTERONE ACETATE 150 MG/ML IM SUSP
150.0000 mg | Freq: Once | INTRAMUSCULAR | Status: AC
  Administered 2021-06-21: 150 mg via INTRAMUSCULAR

## 2021-06-21 MED ORDER — MEDROXYPROGESTERONE ACETATE 150 MG/ML IM SUSP: 150.0000 mg | Freq: Once | INTRAMUSCULAR | Status: DC

## 2021-06-21 MED ORDER — MEDROXYPROGESTERONE ACETATE 150 MG/ML IM SUSP: 150.0000 mg | INTRAMUSCULAR | 3 refills | Status: DC

## 2021-06-21 NOTE — Progress Notes (Signed)
   NURSE VISIT- INJECTION  SUBJECTIVE:  Karla Ward is a 50 y.o. [phone] female here for a Depo Provera for contraception/period management. She is a GYN patient.   OBJECTIVE:  There were no vitals taken for this visit.  Appears well, in no apparent distress  Injection administered in: Left deltoid  Meds ordered this encounter  Medications   medroxyPROGESTERone (DEPO-PROVERA) injection 150 mg    ASSESSMENT: GYN patient Depo Provera for contraception/period management PLAN: Follow-up: in 11-13 weeks for next Depo   Revella Shelton A Naleyah Ohlinger  06/21/2021 4:32 PM

## 2021-06-21 NOTE — Patient Instructions (Signed)
Thank you for choosing our office today! We appreciate the opportunity to meet your healthcare needs. You may receive a short survey by e-mail or through MyChart. If you are happy with your care we would appreciate if you could take just a few minutes to complete the survey questions. We read all of your comments and take your feedback very seriously. Thank you again for choosing our office.  Karla Ward   [facility] Team  

## 2021-06-21 NOTE — Progress Notes (Signed)
Patient ID: Karla Ward, female   DOB: Jun 05, 1971, 50 y.o.   MRN: [id number] History of Present Illness:  Karla Ward is 50 year old black female,married,G2P2 in for a well woman gyn exam, and depo, but she forgot her depo, will come back.  Lab Results  Component Value Date   DIAGPAP (A) 01/20/2020    - Atypical squamous cells of undetermined significance (ASC-US)   HPVHIGH Negative 01/20/2020   PCP is Dr. Sudie Bailey.  Current Medications, Allergies, Past Medical History, Past Surgical History, Family History and Social History were reviewed in [name] record.     Review of Systems: Patient denies any headaches, hearing loss, fatigue, blurred vision, shortness of breath, chest pain, abdominal pain, problems with bowel movements, urination, or intercourse. No joint pain or mood swings.     Physical Exam:BP 130/80 (BP Location: Left Arm, Patient Position: Sitting, Cuff Size: Normal)   Pulse 78   Ht 5' 2.5" (1.588 m)   Wt 197 lb (89.4 kg)   BMI 35.46 kg/m   General:  Well developed, well nourished, no acute distress Skin:  Warm and dry Neck:  Midline trachea, normal thyroid, good ROM, no lymphadenopathy Lungs; Clear to auscultation bilaterally Breast:  No dominant palpable mass, retraction, or nipple discharge Cardiovascular: Regular rate and rhythm Abdomen:  Soft, non tender, no hepatosplenomegaly Pelvic:  External genitalia is normal in appearance, no lesions.  The vagina is normal in appearance. Urethra has no lesions or masses. The cervix is bulbous.  Uterus is felt to be normal size, shape, and contour.  No adnexal masses or tenderness noted.Bladder is non tender, no masses felt. Rectal: Good sphincter tone, no polyps, or hemorrhoids felt.  Hemoccult negative. Extremities/musculoskeletal:  No swelling or varicosities noted, no clubbing or cyanosis Psych:  No mood changes, alert and cooperative,seems happy AA is 0  Fall risk is low Depression screen  [facility] 2/9 06/21/2021 01/20/2020  Decreased Interest 0 0  Down, Depressed, Hopeless 0 0  PHQ - 2 Score 0 0  Altered sleeping 0 -  Tired, decreased energy 0 -  Change in appetite 0 -  Feeling bad or failure about yourself  0 -  Trouble concentrating 0 -  Moving slowly or fidgety/restless 0 -  Suicidal thoughts 0 -  PHQ-9 Score 0 -    GAD 7 : Generalized Anxiety Score 06/21/2021  Nervous, Anxious, on Edge 0  Control/stop worrying 0  Worry too much - different things 0  Trouble relaxing 0  Restless 0  Easily annoyed or irritable 0  Afraid - awful might happen 0  Total GAD 7 Score 0      Upstream - 06/21/21 0848       Pregnancy Intention Screening   Does the patient want to become pregnant in the next year? No    Does the patient's partner want to become pregnant in the next year? No    Would the patient like to discuss contraceptive options today? No      Contraception Wrap Up   Current Method Hormonal Injection    End Method Hormonal Injection    Contraception Counseling Provided No            Examination chaperoned by Faith Rogue LPN.   Impression and Plan: 1. Encounter for well woman exam with routine gynecological exam Physical in 1 year Pap in 2024 Mammogram yearly Labs with PCP She wants to talk with PCP about colonoscopy  She requested refill on Lotrisone  2. Encounter  for screening fecal occult blood testing   3. Encounter for surveillance of injectable contraceptive Refilled depo, and will come back for injection Meds ordered this encounter  Medications   DISCONTD: medroxyPROGESTERone (DEPO-PROVERA) injection 150 mg   medroxyPROGESTERone (DEPO-PROVERA) 150 MG/ML injection    Sig: Inject 1 mL (150 mg total) into the muscle every 3 (three) months.    Dispense:  1 mL    Refill:  3    Order Specific Question:   Supervising Provider    Answer:   Duane Lope H [[id number]]   clotrimazole-betamethasone (LOTRISONE) cream    Sig: Apply 1 application topically  2 (two) times daily.    Dispense:  30 g    Refill:  2    Order Specific Question:   Supervising Provider    Answer:   Despina Hidden, LUTHER H [[id number]]     4. Atypical squamous cell changes of undetermined significance (ASCUS) on cervical cytology with negative high risk human papilloma virus (HPV) test result Pap 2024

## 2021-08-26 ENCOUNTER — Other Ambulatory Visit: Payer: Self-pay

## 2021-08-26 ENCOUNTER — Ambulatory Visit
Admission: EM | Admit: 2021-08-26 | Discharge: 2021-08-26 | Disposition: A | Discharge status: Home / Self Care | Payer: BC Managed Care – PPO | Attending: Physician Assistant | Admitting: Physician Assistant

## 2021-08-26 ENCOUNTER — Encounter: Payer: Self-pay | Admitting: Emergency Medicine

## 2021-08-26 DIAGNOSIS — K2100 Gastro-esophageal reflux disease with esophagitis, without bleeding: Secondary | ICD-10-CM

## 2021-08-26 MED ORDER — LIDOCAINE VISCOUS HCL 2 % MT SOLN
15.0000 mL | Freq: Once | OROMUCOSAL | Status: AC
  Administered 2021-08-26: 15 mL via ORAL

## 2021-08-26 MED ORDER — PANTOPRAZOLE SODIUM 20 MG PO TBEC: 20.0000 mg | DELAYED_RELEASE_TABLET | Freq: Every day | ORAL | 1 refills | Status: DC

## 2021-08-26 MED ORDER — ALUM & MAG HYDROXIDE-SIMETH 200-200-20 MG/5ML PO SUSP
30.0000 mL | Freq: Once | ORAL | Status: AC
  Administered 2021-08-26: 30 mL via ORAL

## 2021-08-26 NOTE — Discharge Instructions (Addendum)
Return if any problems.

## 2021-08-26 NOTE — ED Triage Notes (Addendum)
Patient c/o chest pain that started yesterday.   Patient c/o numbness x 2 days.   Patient denies SOB, dizziness, and generalized weakness.   Patient endorses worsening numbness at night.   Patient endorses " it seems like when I drive my hands hurt".   Patient endorses having a nonproductive cough at night , " it feels like I'm strangling myself".   Patient endorses tightness across chest.   History of HTN.

## 2021-08-27 ENCOUNTER — Telehealth: Payer: Self-pay | Admitting: Internal Medicine

## 2021-08-27 NOTE — Telephone Encounter (Signed)
Pt called to make OV for her reflux and dysphagia. She is aware of her Dec OV with Dr Marletta Lor and she is on a cancellation list. She is asking if we can call in her reflux medicine to [facility] to hold her until her OV or to  get samples. Please advise. [phone]

## 2021-08-29 NOTE — ED Provider Notes (Signed)
[facility]    CSN: [id number] Arrival date & time: 08/26/21  1658      History   Chief Complaint Chief Complaint  Patient presents with   Chest Pain   Numbness    HPI Karla Ward is a 50 y.o. female.   The history is provided by the patient. No language interpreter was used.  Chest Pain Pain location:  L chest Pain radiates to:  Does not radiate Pain severity:  Moderate Onset quality:  Gradual Timing:  Constant Context: eating   Relieved by:  Nothing Worsened by:  Nothing Ineffective treatments:  None tried Associated symptoms: no abdominal pain    Past Medical History:  Diagnosis Date   Anemia    Dizziness    Gastritis    GERD (gastroesophageal reflux disease)    Hypertension    Status post dilation of esophageal narrowing    Vertigo     Patient Active Problem List   Diagnosis Date Noted   Encounter for surveillance of injectable contraceptive 06/21/2021   Encounter for screening fecal occult blood testing 06/21/2021   Encounter for well woman exam with routine gynecological exam 06/21/2021   Atypical squamous cell changes of undetermined significance (ASCUS) on cervical cytology with negative high risk human papilloma virus (HPV) test result 06/21/2021   Encounter for colorectal cancer screening 01/20/2020   Encounter for gynecological examination with Papanicolaou smear of cervix 01/20/2020   Colon cancer screening 07/06/2017   N&V (nausea and vomiting) 09/14/2016   Menorrhagia 05/30/2016   GERD (gastroesophageal reflux disease) 08/13/2015   Palpitations 11/22/2012    Past Surgical History:  Procedure Laterality Date   DILITATION & CURRETTAGE/HYSTROSCOPY WITH NOVASURE ABLATION N/A 04/05/2017   Procedure: DILATATION & CURETTAGE/HYSTEROSCOPY WITH NOVASURE ENDOMETRIAL ABLATION;  Surgeon: Lazaro Arms, MD;  Location: [facility];  Service: Gynecology;  Laterality: N/A;   ESOPHAGOGASTRODUODENOSCOPY N/A 09/02/2015   XQJ:JHER non-erosive  gastritis/stricture at the gastroesophageal junction    NONE TO DATE  08/13/15   SAVORY DILATION  09/02/2015   Procedure: SAVORY DILATION;  Surgeon: West Bali, MD;  Location: [facility];  Service: Endoscopy;;    OB History     Gravida  2   Para  2   Term      Preterm  2   AB      Living  2      SAB      IAB      Ectopic      Multiple      Live Births  2            Home Medications    Prior to Admission medications   Medication Sig Start Date End Date Taking? Authorizing Provider  amLODipine (NORVASC) 5 MG tablet Take 5 mg by mouth daily.   Yes [provider]  pantoprazole (PROTONIX) 20 MG tablet Take 1 tablet (20 mg total) by mouth daily. 08/26/21 08/26/22 Yes Elson Areas, PA-C  clotrimazole-betamethasone (LOTRISONE) cream Apply 1 application topically 2 (two) times daily. 06/21/21   Adline Potter, NP  medroxyPROGESTERone (DEPO-PROVERA) 150 MG/ML injection Inject 1 mL (150 mg total) into the muscle every 3 (three) months. 06/21/21   Adline Potter, NP  ondansetron (ZOFRAN ODT) 4 MG disintegrating tablet Take 1 tablet (4 mg total) by mouth every 8 (eight) hours as needed for nausea or vomiting. Patient not taking: No sig reported 12/29/18   Caccavale, Sophia, PA-C    Family History Family History  Problem Relation Age of Onset   Throat cancer Father 29   Hypertension Father    Hypertension Mother    Congestive Heart Failure Sister    Colon cancer Neg Hx    Colon polyps Neg Hx     Social History Social History   Tobacco Use   Smoking status: Never   Smokeless tobacco: Never  Vaping Use   Vaping Use: Never used  Substance Use Topics   Alcohol use: No   Drug use: No     Allergies   Benadryl [diphenhydramine hcl (sleep)], Ciprofloxacin, Ibuprofen, Penicillins, and Vitamin d analogs   Review of Systems Review of Systems  Cardiovascular:  Positive for chest pain.  Gastrointestinal:  Negative for abdominal pain.   All other systems reviewed and are negative.   Physical Exam Triage Vital Signs ED Triage Vitals [08/26/21 1722]  Enc Vitals Group     BP (!) 147/82     Pulse Rate 81     Resp 18     Temp 98.4 F (36.9 C)     Temp Source Oral     SpO2 99 %     Weight      Height      Head Circumference      Peak Flow      Pain Score 8     Pain Loc      Pain Edu?      Excl. in GC?    No data found.  Updated Vital Signs BP (!) 147/82 (BP Location: Right Arm)   Pulse 81   Temp 98.4 F (36.9 C) (Oral)   Resp 18   SpO2 99%   Visual Acuity Right Eye Distance:   Left Eye Distance:   Bilateral Distance:    Right Eye Near:   Left Eye Near:    Bilateral Near:     Physical Exam Vitals and nursing note reviewed.  Constitutional:      Appearance: She is well-developed.  HENT:     Head: Normocephalic.  Cardiovascular:     Heart sounds: Normal heart sounds.  Pulmonary:     Effort: Pulmonary effort is normal.  Abdominal:     General: There is no distension.     Palpations: Abdomen is soft.  Musculoskeletal:        General: Normal range of motion.     Cervical back: Normal range of motion.  Neurological:     Mental Status: She is alert and oriented to person, place, and time.     UC Treatments / Results  Labs (all labs ordered are listed, but only abnormal results are displayed) Labs Reviewed - No data to display  EKG   Radiology No results found.  Procedures Procedures (including critical care time)  Medications Ordered in UC Medications  alum & mag hydroxide-simeth (MAALOX/MYLANTA) 200-200-20 MG/5ML suspension 30 mL (30 mLs Oral Given 08/26/21 1828)    And  lidocaine (XYLOCAINE) 2 % viscous mouth solution 15 mL (15 mLs Oral Given 08/26/21 1828)    Initial Impression / Assessment and Plan / UC Course  I have reviewed the triage vital signs and the nursing notes.  Pertinent labs & imaging results that were available during my care of the patient were reviewed by  me and considered in my medical decision making (see chart for details).     MDM:  Pt counseled on reflux and treatment Final Clinical Impressions(s) / UC Diagnoses   Final diagnoses:  Gastroesophageal reflux disease with esophagitis without  hemorrhage     Discharge Instructions      Return if any problems.     ED Prescriptions     Medication Sig Dispense Auth. Provider   pantoprazole (PROTONIX) 20 MG tablet Take 1 tablet (20 mg total) by mouth daily. 30 tablet Elson Areas, [place]      An After Visit Summary was printed and given to the patient.  PDMP not reviewed this encounter.   Elson Areas, [place] 08/29/21 [id number]

## 2021-08-30 ENCOUNTER — Other Ambulatory Visit: Payer: Self-pay

## 2021-08-30 ENCOUNTER — Ambulatory Visit: Payer: BC Managed Care – PPO | Admitting: Cardiovascular Disease

## 2021-08-30 ENCOUNTER — Encounter: Payer: Self-pay | Admitting: Cardiovascular Disease

## 2021-08-30 ENCOUNTER — Telehealth: Payer: Self-pay | Admitting: Internal Medicine

## 2021-08-30 VITALS — BP 140/90 | HR 73 | Ht 64.0 in | Wt 198.4 lb

## 2021-08-30 DIAGNOSIS — K219 Gastro-esophageal reflux disease without esophagitis: Secondary | ICD-10-CM | POA: Diagnosis not present

## 2021-08-30 DIAGNOSIS — R072 Precordial pain: Secondary | ICD-10-CM | POA: Diagnosis not present

## 2021-08-30 MED ORDER — METOPROLOL TARTRATE 100 MG PO TABS: ORAL_TABLET | ORAL | 0 refills | Status: DC

## 2021-08-30 NOTE — Telephone Encounter (Signed)
Pt following up on her call from Friday. I told her that Dr Queen Blossom nurse was out today. She is asking for samples for reflux to hold her until her OV. Please advise [phone]

## 2021-08-30 NOTE — Patient Instructions (Addendum)
Medication Instructions:  Take Metoprolol 100 mg two hours before CT scan is scheduled.   *If you need a refill on your cardiac medications before your next appointment, please call your pharmacy*   Lab Work: BMET today   If you have labs (blood work) drawn today and your tests are completely normal, you will receive your results only by: MyChart Message (if you have MyChart) OR A paper copy in the mail If you have any lab test that is abnormal or we need to change your treatment, we will call you to review the results.   Testing/Procedures: Your physician has requested that you have cardiac CT. Cardiac computed tomography (CT) is a painless test that uses an x-ray machine to take clear, detailed pictures of your heart. For further information please visit [email or web address] Please follow instruction sheet as given.   Follow-Up: At [facility], you and your health needs are our priority.  As part of our continuing mission to provide you with exceptional heart care, we have created designated Provider Care Teams.  These Care Teams include your primary Cardiologist (physician) and Advanced Practice Providers (APPs -  Physician Assistants and Nurse Practitioners) who all work together to provide you with the care you need, when you need it.  We recommend signing up for the patient portal called "MyChart".  Sign up information is provided on this After Visit Summary.  MyChart is used to connect with patients for Virtual Visits (Telemedicine).  Patients are able to view lab/test results, encounter notes, upcoming appointments, etc.  Non-urgent messages can be sent to your provider as well.   To learn more about what you can do with MyChart, go to [email or web address].    Your next appointment:   As needed  The format for your next appointment:   In Person  Provider:   Lennie Odor, MD   Other Instructions Referral to [facility]- they will be calling to get you  scheduled.      Your cardiac CT will be scheduled at one of the below locations:   [facility] [address] [place], [place] [address] [phone]  If scheduled at [facility], please arrive at the [facility] main entrance (entrance A) of [facility] 30 minutes prior to test start time. You can use the FREE valet parking offered at the main entrance (encouraged to control the heart rate for the test) Proceed to the [facility] Radiology Department (first floor) to check-in and test prep.   Please follow these instructions carefully (unless otherwise directed):  Hold all erectile dysfunction medications at least 3 days (72 hrs) prior to test.  On the Night Before the Test: Be sure to Drink plenty of water. Do not consume any caffeinated/decaffeinated beverages or chocolate 12 hours prior to your test. Do not take any antihistamines 12 hours prior to your test.  On the Day of the Test: Drink plenty of water until 1 hour prior to the test. Do not eat any food 4 hours prior to the test. You may take your regular medications prior to the test.  Take metoprolol (Lopressor) two hours prior to test. HOLD Furosemide/Hydrochlorothiazide morning of the test. FEMALES- please wear underwire-free bra if available, avoid dresses & tight clothing       After the Test: Drink plenty of water. After receiving IV contrast, you may experience a mild flushed feeling. This is normal. On occasion, you may experience a mild rash up to 24 hours after the test.  This is not dangerous. If this occurs, you can take Benadryl 25 mg and increase your fluid intake. If you experience trouble breathing, this can be serious. If it is severe call 911 IMMEDIATELY. If it is mild, please call our office. If you take any of these medications: Glipizide/Metformin, Avandament, Glucavance, please do not take 48 hours after completing test unless otherwise instructed.  Please allow 2-4  weeks for scheduling of routine cardiac CTs. Some insurance companies require a pre-authorization which may delay scheduling of this test.   For non-scheduling related questions, please contact the cardiac imaging nurse navigator should you have any questions/concerns: Rockwell Alexandria, Cardiac Imaging Nurse Navigator Larey Brick, Cardiac Imaging Nurse Navigator [facility] Direct Office Dial: [phone]   For scheduling needs, including cancellations and rescheduling, please call [place], [phone].

## 2021-08-30 NOTE — Progress Notes (Signed)
Cardiology Office Note:   Date:  08/30/2021  NAME:  Karla Ward    MRN: [id number] DOB:  [birth year]   PCP:  Gareth Morgan, MD  Cardiologist:  None  Electrophysiologist:  None   Referring MD: Gareth Morgan, MD   Chief Complaint  Patient presents with   Chest Pain    History of Present Illness:   Karla Ward is a 50 y.o. female with a hx of HTN who presents for follow-up. Seen in December 2021 for SOB and palpitations. Did not complete monitor or echo.  She reports her shortness of breath and palpitations have improved.  She was seen in the emergency room on 08/26/2021 for chest pain symptoms.  She reports nearly 1 day of constant achiness in her chest.  She reports she feels like food is getting stuck.  She does have a history of esophageal stricture requiring dilation in the past.  She is not losing weight.  No hematemesis reported.  She reports that her pain would come and go.  She reports no further episodes but still feels like food is getting stuck.  She reports exertion does not make her pain worse.  Cessation of activity gets out of For pain.  Her EKG in office is normal.  No acute ischemic changes or evidence of infarction.  No updates to medical history other than well-controlled blood pressure.  She denies any shortness of breath.  She is not exercising due to concern that this could be her heart.  She does have a sister who had congestive heart failure.  She is concerned her symptoms could represent her heart.  We discussed that they likely represent esophageal stricture.  She is still interested in making sure her heart is okay.  Past Medical History: Past Medical History:  Diagnosis Date   Anemia    Dizziness    Gastritis    GERD (gastroesophageal reflux disease)    Hypertension    Status post dilation of esophageal narrowing    Vertigo     Past Surgical History: Past Surgical History:  Procedure Laterality Date   DILITATION &  CURRETTAGE/HYSTROSCOPY WITH NOVASURE ABLATION N/A 04/05/2017   Procedure: DILATATION & CURETTAGE/HYSTEROSCOPY WITH NOVASURE ENDOMETRIAL ABLATION;  Surgeon: Lazaro Arms, MD;  Location: [facility] ORS;  Service: Gynecology;  Laterality: N/A;   ESOPHAGOGASTRODUODENOSCOPY N/A 09/02/2015   PIR:JJOA non-erosive gastritis/stricture at the gastroesophageal junction    NONE TO DATE  08/13/15   SAVORY DILATION  09/02/2015   Procedure: SAVORY DILATION;  Surgeon: West Bali, MD;  Location: [facility] ENDO SUITE;  Service: Endoscopy;;    Current Medications: Current Meds  Medication Sig   amLODipine (NORVASC) 5 MG tablet Take 5 mg by mouth daily.   clotrimazole-betamethasone (LOTRISONE) cream Apply 1 application topically 2 (two) times daily.   meclizine (ANTIVERT) 25 MG tablet Take 50 mg by mouth 2 (two) times daily.   medroxyPROGESTERone (DEPO-PROVERA) 150 MG/ML injection Inject 1 mL (150 mg total) into the muscle every 3 (three) months.   metoprolol tartrate (LOPRESSOR) 100 MG tablet Take 1 tablet by mouth once for procedure.     Allergies:    Benadryl [diphenhydramine hcl (sleep)], Ciprofloxacin, Ibuprofen, Penicillins, and Vitamin d analogs   Social History: Social History   Socioeconomic History   Marital status: Married    Spouse name: Not on file   Number of children: 2   Years of education: Not on file   Highest education level: Not on file  Occupational History  Not on file  Tobacco Use   Smoking status: Never   Smokeless tobacco: Never  Vaping Use   Vaping Use: Never used  Substance and Sexual Activity   Alcohol use: No   Drug use: No   Sexual activity: Yes    Birth control/protection: Surgical, Injection    Comment: ablation  Other Topics Concern   Not on file  Social History Narrative   Lives with husband   Social Determinants of Health   Financial Resource Strain: Low Risk    Difficulty of Paying Living Expenses: Not hard at all  Food Insecurity: No Food Insecurity    Worried About Programme researcher, broadcasting/film/video in the Last Year: Never true   Ran Out of Food in the Last Year: Never true  Transportation Needs: No Transportation Needs   Lack of Transportation (Medical): No   Lack of Transportation (Non-Medical): No  Physical Activity: Insufficiently Active   Days of Exercise per Week: 3 days   Minutes of Exercise per Session: 30 min  Stress: No Stress Concern Present   Feeling of Stress : Only a little  Social Connections: Press photographer of Communication with Friends and Family: More than three times a week   Frequency of Social Gatherings with Friends and Family: Twice a week   Attends Religious Services: More than 4 times per year   Active Member of [employer] or Organizations: Yes   Attends Banker Meetings: 1 to 4 times per year   Marital Status: Married     Family History: The patient's family history includes Congestive Heart Failure in her sister; Hypertension in her father and mother; Throat cancer (age of onset: 92) in her father. There is no history of Colon cancer or Colon polyps.  ROS:   All other ROS reviewed and negative. Pertinent positives noted in the HPI.     EKGs/Labs/Other Studies Reviewed:   The following studies were personally reviewed by me today:  EKG:  EKG is ordered today.  The ekg ordered today demonstrates normal sinus rhythm heart rate 73, no acute ischemic changes or evidence of infarction, and was personally reviewed by me.   Recent Labs: 10/09/2020: BNP 12.7; TSH 1.240 01/01/2021: BUN 10; Creatinine, Ser 1.04; Hemoglobin 13.7; Platelets 322; Potassium 3.4; Sodium 139   Recent Lipid Panel No results found for: CHOL, TRIG, HDL, CHOLHDL, VLDL, LDLCALC, LDLDIRECT  Physical Exam:   VS:  BP 140/90 (BP Location: Left Arm, Patient Position: Sitting, Cuff Size: Large)   Pulse 73   Ht 5\' 4"  (1.626 m)   Wt 198 lb 6.4 oz (90 kg)   SpO2 97%   BMI 34.06 kg/m    Wt Readings from Last 3 Encounters:  08/30/21  198 lb 6.4 oz (90 kg)  06/21/21 197 lb (89.4 kg)  05/04/21 203 lb (92.1 kg)    General: Well nourished, well developed, in no acute distress Head: Atraumatic, normal size  Eyes: PEERLA, EOMI  Neck: Supple, no JVD Endocrine: No thryomegaly Cardiac: Normal S1, S2; RRR; no murmurs, rubs, or gallops Lungs: Clear to auscultation bilaterally, no wheezing, rhonchi or rales  Abd: Soft, nontender, no hepatomegaly  Ext: No edema, pulses 2+ Musculoskeletal: No deformities, BUE and BLE strength normal and equal Skin: Warm and dry, no rashes   Neuro: Alert and oriented to person, place, time, and situation, CNII-XII grossly intact, no focal deficits  Psych: Normal mood and affect   ASSESSMENT:   Burkley Mays is a 50 y.o.  female who presents for the following: 1. Precordial pain   2. Gastroesophageal reflux disease without esophagitis     PLAN:   1. Precordial pain 2. Gastroesophageal reflux disease without esophagitis -She presents with chest pain symptoms.  Describes tightness in her chest.  Reports it is constant.  It is not worsened by exertion or alleviated by cessation of activity.  Seems to be associated with sensation of food being stuck.  She does have a history of esophageal stricture that required dilation.  Her EKG in office is very normal.  Cardiovascular examination is normal.  To me I suspect this is all esophageal pathology.  I have referred her back to her gastroenterologist to reevaluate.  We did discuss that we cannot be absolutely certain this is not her heart.  She is interested in coronary CTA.  We will proceed with the study.  She will give [place] blood work today and we will give her 100 mg of metoprolol tartrate) scan.  This will also give [place] anatomic data on her coronary arteries.  She may end up having nonobstructive disease as I suspect she does not have any obstructive CAD.  I have very low suspicion that her symptoms represent true angina.  This will give [place] better  idea about what sort of preventive therapy she should be on.  In the meantime she will work with GI on her known history of esophageal stricture.  Disposition: Return if symptoms worsen or fail to improve.  Medication Adjustments/Labs and Tests Ordered: Current medicines are reviewed at length with the patient today.  Concerns regarding medicines are outlined above.  Orders Placed This Encounter  Procedures   CT CORONARY MORPH W/CTA COR W/SCORE W/CA W/CM &/OR WO/CM   Basic metabolic panel   Ambulatory referral to Gastroenterology   EKG 12-Lead    Meds ordered this encounter  Medications   metoprolol tartrate (LOPRESSOR) 100 MG tablet    Sig: Take 1 tablet by mouth once for procedure.    Dispense:  1 tablet    Refill:  0     Patient Instructions  Medication Instructions:  Take Metoprolol 100 mg two hours before CT scan is scheduled.   *If you need a refill on your cardiac medications before your next appointment, please call your pharmacy*   Lab Work: BMET today   If you have labs (blood work) drawn today and your tests are completely normal, you will receive your results only by: MyChart Message (if you have MyChart) OR A paper copy in the mail If you have any lab test that is abnormal or we need to change your treatment, we will call you to review the results.   Testing/Procedures: Your physician has requested that you have cardiac CT. Cardiac computed tomography (CT) is a painless test that uses an x-ray machine to take clear, detailed pictures of your heart. For further information please visit [email or web address] Please follow instruction sheet as given.   Follow-Up: At [facility], you and your health needs are our priority.  As part of our continuing mission to provide you with exceptional heart care, we have created designated Provider Care Teams.  These Care Teams include your primary Cardiologist (physician) and Advanced Practice Providers (APPs -  Physician  Assistants and Nurse Practitioners) who all work together to provide you with the care you need, when you need it.  We recommend signing up for the patient portal called "MyChart".  Sign up information is provided on this After Visit Summary.  MyChart is used to connect with patients for Virtual Visits (Telemedicine).  Patients are able to view lab/test results, encounter notes, upcoming appointments, etc.  Non-urgent messages can be sent to your provider as well.   To learn more about what you can do with MyChart, go to [email or web address].    Your next appointment:   As needed  The format for your next appointment:   In Person  Provider:   Lennie Odor, MD   Other Instructions Referral to [facility]- they will be calling to get you scheduled.      Your cardiac CT will be scheduled at one of the below locations:   [facility] [address] [place], [place] [address] [phone]  If scheduled at [facility], please arrive at the [facility] main entrance (entrance A) of [facility] 30 minutes prior to test start time. You can use the FREE valet parking offered at the main entrance (encouraged to control the heart rate for the test) Proceed to the [facility] Radiology Department (first floor) to check-in and test prep.   Please follow these instructions carefully (unless otherwise directed):  Hold all erectile dysfunction medications at least 3 days (72 hrs) prior to test.  On the Night Before the Test: Be sure to Drink plenty of water. Do not consume any caffeinated/decaffeinated beverages or chocolate 12 hours prior to your test. Do not take any antihistamines 12 hours prior to your test.  On the Day of the Test: Drink plenty of water until 1 hour prior to the test. Do not eat any food 4 hours prior to the test. You may take your regular medications prior to the test.  Take metoprolol (Lopressor) two hours prior to test. HOLD  Furosemide/Hydrochlorothiazide morning of the test. FEMALES- please wear underwire-free bra if available, avoid dresses & tight clothing       After the Test: Drink plenty of water. After receiving IV contrast, you may experience a mild flushed feeling. This is normal. On occasion, you may experience a mild rash up to 24 hours after the test. This is not dangerous. If this occurs, you can take Benadryl 25 mg and increase your fluid intake. If you experience trouble breathing, this can be serious. If it is severe call 911 IMMEDIATELY. If it is mild, please call our office. If you take any of these medications: Glipizide/Metformin, Avandament, Glucavance, please do not take 48 hours after completing test unless otherwise instructed.  Please allow 2-4 weeks for scheduling of routine cardiac CTs. Some insurance companies require a pre-authorization which may delay scheduling of this test.   For non-scheduling related questions, please contact the cardiac imaging nurse navigator should you have any questions/concerns: Rockwell Alexandria, Cardiac Imaging Nurse Navigator Larey Brick, Cardiac Imaging Nurse Navigator [facility] Direct Office Dial: [phone]   For scheduling needs, including cancellations and rescheduling, please call [place], [phone].    Time Spent with Patient: I have spent a total of 35 minutes with patient reviewing hospital notes, telemetry, EKGs, labs and examining the patient as well as establishing an assessment and plan that was discussed with the patient.  > 50% of time was spent in direct patient care.  Signed, Lenna Gilford. Flora Lipps, MD, [facility]  [facility]  [address], [address] [place], [place] [address] [phone]  08/30/2021 3:08 PM

## 2021-08-30 NOTE — Telephone Encounter (Signed)
Noted  

## 2021-08-31 LAB — BASIC METABOLIC PANEL
BUN/Creatinine Ratio: 8 — ABNORMAL LOW (ref 9–23)
BUN: 8 mg/dL (ref 6–24)
CO2: 20 mmol/L (ref 20–29)
Calcium: 10 mg/dL (ref 8.7–10.2)
Chloride: 106 mmol/L (ref 96–106)
Creatinine, Ser: 1.01 mg/dL — ABNORMAL HIGH (ref 0.57–1.00)
Glucose: 74 mg/dL (ref 70–99)
Potassium: 4.5 mmol/L (ref 3.5–5.2)
Sodium: 144 mmol/L (ref 134–144)
eGFR: 69 mL/min/{1.73_m2} (ref >59)

## 2021-08-31 NOTE — Telephone Encounter (Signed)
Phoned  and spoke with the pt and was advised at the Urgent Care she was given Pantoprazole but it is not really helping (but she had been off of it for so long that I told her it has to get into her system). The pt is going to call me back this evening with the Rx that does help her. I advised her that to continue taking the Pantoprazole for now until we know something different regarding her meds or an appt to come in. Pt expressed understanding.

## 2021-08-31 NOTE — Telephone Encounter (Signed)
Pt has an appt with Dr. Marletta Lor on November 3rd.

## 2021-09-02 ENCOUNTER — Encounter: Payer: Self-pay | Admitting: Internal Medicine

## 2021-09-02 ENCOUNTER — Ambulatory Visit: Payer: BC Managed Care – PPO | Admitting: Internal Medicine

## 2021-09-02 ENCOUNTER — Other Ambulatory Visit: Payer: Self-pay

## 2021-09-02 VITALS — BP 130/76 | HR 86 | Temp 97.7°F | Ht 64.0 in | Wt 197.4 lb

## 2021-09-02 DIAGNOSIS — Z1211 Encounter for screening for malignant neoplasm of colon: Secondary | ICD-10-CM

## 2021-09-02 DIAGNOSIS — K219 Gastro-esophageal reflux disease without esophagitis: Secondary | ICD-10-CM | POA: Diagnosis not present

## 2021-09-02 MED ORDER — OMEPRAZOLE 20 MG PO CPDR: 20.0000 mg | DELAYED_RELEASE_CAPSULE | Freq: Two times a day (BID) | ORAL | 5 refills | Status: DC

## 2021-09-02 NOTE — Progress Notes (Signed)
Referring Provider: Gareth Morgan, MD Primary Care Physician:  Gareth Morgan, MD Primary GI:  Dr. Marletta Lor  Chief Complaint  Patient presents with   Gastroesophageal Reflux    Was hurting bad in chest and back over the weekend. Some better now since started Omeprazole    HPI:   Karla Ward is a 50 y.o. female who presents for follow-up visit.  Patient last seen March 2020 in our office.  Has a history of chronic GERD with gastritis.  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 pylori negative gastritis.  At that time was on PPI therapy though this was stopped as she ran out.  Did not follow-up with our office.  She states approximately 1 to 2 weeks ago ago she had acute onset of epigastric pain and heartburn/reflux.  This prompted an urgent care visit.  She was started on pantoprazole which did not help.  She was recently switched to omeprazole 20 mg daily and states today her symptoms are improved.  No dysphagia odynophagia.  No longer having epigastric pain.  No melena hematochezia.  No chronic NSAID use.  Does take Tylenol on occasion.  No previous colonoscopy.  No family history of colorectal malignancy.  No unintentional weight loss.  Past Medical History:  Diagnosis Date   Anemia    Dizziness    Gastritis    GERD (gastroesophageal reflux disease)    Hypertension    Status post dilation of esophageal narrowing    Vertigo     Past Surgical History:  Procedure Laterality Date   DILITATION & CURRETTAGE/HYSTROSCOPY WITH NOVASURE ABLATION N/A 04/05/2017   Procedure: DILATATION & CURETTAGE/HYSTEROSCOPY WITH NOVASURE ENDOMETRIAL ABLATION;  Surgeon: Lazaro Arms, MD;  Location: [facility];  Service: Gynecology;  Laterality: N/A;   ESOPHAGOGASTRODUODENOSCOPY N/A 09/02/2015   ASN:KNLZ non-erosive gastritis/stricture at the gastroesophageal junction    NONE TO DATE  08/13/15   SAVORY DILATION   09/02/2015   Procedure: SAVORY DILATION;  Surgeon: West Bali, MD;  Location: [facility];  Service: Endoscopy;;    Current Outpatient Medications  Medication Sig Dispense Refill   amLODipine (NORVASC) 5 MG tablet Take 5 mg by mouth daily.     clotrimazole-betamethasone (LOTRISONE) cream Apply 1 application topically 2 (two) times daily. (Patient taking differently: Apply 1 application topically as needed.) 30 g 2   fluticasone (FLONASE) 50 MCG/ACT nasal spray Place 2 sprays into both nostrils daily.     meclizine (ANTIVERT) 25 MG tablet Take 50 mg by mouth 2 (two) times daily as needed.     medroxyPROGESTERone (DEPO-PROVERA) 150 MG/ML injection Inject 1 mL (150 mg total) into the muscle every 3 (three) months. 1 mL 3   ondansetron (ZOFRAN ODT) 4 MG disintegrating tablet Take 1 tablet (4 mg total) by mouth every 8 (eight) hours as needed for nausea or vomiting. 15 tablet 0   metoprolol tartrate (LOPRESSOR) 100 MG tablet Take 1 tablet by mouth once for procedure. (Patient not taking: Reported on 09/02/2021) 1 tablet 0   omeprazole (PRILOSEC) 20 MG capsule Take 1 capsule (20 mg total) by mouth 2 (two) times daily before a meal. 60 capsule 5   No current facility-administered medications for this visit.    Allergies as of 09/02/2021 - Review Complete 09/02/2021  Allergen Reaction Noted   Benadryl [diphenhydramine hcl (sleep)]  12/26/2014   Ciprofloxacin  04/29/2021   Ibuprofen  04/29/2021  Penicillins Hives 11/19/2012   Vitamin d analogs  04/29/2021    Family History  Problem Relation Age of Onset   Throat cancer Father 29   Hypertension Father    Hypertension Mother    Congestive Heart Failure Sister    Colon cancer Neg Hx    Colon polyps Neg Hx     Social History   Socioeconomic History   Marital status: Married    Spouse name: Not on file   Number of children: 2   Years of education: Not on file   Highest education level: Not on file  Occupational History   Not on  file  Tobacco Use   Smoking status: Never   Smokeless tobacco: Never  Vaping Use   Vaping Use: Never used  Substance and Sexual Activity   Alcohol use: No   Drug use: No   Sexual activity: Yes    Birth control/protection: Surgical, Injection    Comment: ablation  Other Topics Concern   Not on file  Social History Narrative   Lives with husband   Social Determinants of Health   Financial Resource Strain: Low Risk    Difficulty of Paying Living Expenses: Not hard at all  Food Insecurity: No Food Insecurity   Worried About Programme researcher, broadcasting/film/video in the Last Year: Never true   Ran Out of Food in the Last Year: Never true  Transportation Needs: No Transportation Needs   Lack of Transportation (Medical): No   Lack of Transportation (Non-Medical): No  Physical Activity: Insufficiently Active   Days of Exercise per Week: 3 days   Minutes of Exercise per Session: 30 min  Stress: No Stress Concern Present   Feeling of Stress : Only a little  Social Connections: Press photographer of Communication with Friends and Family: More than three times a week   Frequency of Social Gatherings with Friends and Family: Twice a week   Attends Religious Services: More than 4 times per year   Active Member of [employer] or Organizations: Yes   Attends Banker Meetings: 1 to 4 times per year   Marital Status: Married    Subjective: Review of Systems  Constitutional:  Negative for chills and fever.  HENT:  Negative for congestion and hearing loss.   Eyes:  Negative for blurred vision and double vision.  Respiratory:  Negative for cough and shortness of breath.   Cardiovascular:  Negative for chest pain and palpitations.  Gastrointestinal:  Positive for abdominal pain and heartburn. Negative for blood in stool, constipation, diarrhea, melena and vomiting.  Genitourinary:  Negative for dysuria and urgency.  Musculoskeletal:  Negative for joint pain and myalgias.  Skin:   Negative for itching and rash.  Neurological:  Negative for dizziness and headaches.  Psychiatric/Behavioral:  Negative for depression. The patient is not nervous/anxious.     Objective: BP 130/76   Pulse 86   Temp 97.7 F (36.5 C) (Temporal)   Ht 5\' 4"  (1.626 m)   Wt 197 lb 6.4 oz (89.5 kg)   BMI 33.88 kg/m  Physical Exam Constitutional:      Appearance: Normal appearance.  HENT:     Head: Normocephalic and atraumatic.  Eyes:     Extraocular Movements: Extraocular movements intact.     Conjunctiva/sclera: Conjunctivae normal.  Cardiovascular:     Rate and Rhythm: Normal rate and regular rhythm.  Pulmonary:     Effort: Pulmonary effort is normal.     Breath sounds: Normal  breath sounds.  Abdominal:     General: Bowel sounds are normal.     Palpations: Abdomen is soft.  Musculoskeletal:        General: No swelling. Normal range of motion.     Cervical back: Normal range of motion and neck supple.  Skin:    General: Skin is warm and dry.     Coloration: Skin is not jaundiced.  Neurological:     General: No focal deficit present.     Mental Status: She is alert and oriented to person, place, and time.  Psychiatric:        Mood and Affect: Mood normal.        Behavior: Behavior normal.     Assessment: *GERD-improved on omeprazole *Epigastric pain *Colon cancer screening  Plan: Patient's upper GI symptoms have improved since restarting omeprazole.  I will increase her to twice daily for the next 12 weeks at which point she can go down to once daily.  No alarm symptoms today to warrant further evaluation with EGD.  If she does not improve as expected on PPI therapy, can consider EGD to further evaluate.  Continue to avoid NSAIDs.  Patient is due for screening colonoscopy.  We will hold off on scheduling this at present until she follows up in 2 to 3 months as she may require EGD at the same time as above.  Follow-up in 3 months or sooner if needed.  09/02/2021 3:08  PM   Disclaimer: This note was dictated with voice recognition software. Similar sounding words can inadvertently be transcribed and may not be corrected upon review.

## 2021-09-02 NOTE — Patient Instructions (Signed)
For your heartburn or reflux, I am going to increase your omeprazole to 20 mg twice daily.  This medication works best if you take it 30 minutes before breakfast.  The second dose you can take 30 minutes before supper or at bedtime.  You are due for screening colonoscopy.  We will hold off on scheduling this for now just in case you potentially need upper endoscopy at the same time to evaluate your upper GI symptoms.  Follow-up in 3 months or sooner if needed.  If you have worsening of your symptoms then please let us know and we can schedule your procedures immediately.  It was very nice meeting you today.  Dr. Marletta Lor  At [facility] we value your feedback. You may receive a survey about your visit today. Please share your experience as we strive to create trusting relationships with our patients to provide genuine, compassionate, quality care.  We appreciate your understanding and patience as we review any laboratory studies, imaging, and other diagnostic tests that are ordered as we care for you. Our office policy is 5 business days for review of these results, and any emergent or urgent results are addressed in a timely manner for your best interest. If you do not hear from our office in 1 week, please contact us.   We also encourage the use of MyChart, which contains your medical information for your review as well. If you are not enrolled in this feature, an access code is on this after visit summary for your convenience. Thank you for allowing [place] to be involved in your care.  It was great to see you today!  I hope you have a great rest of your Fall!    Hennie Duos. Marletta Lor, D.O. Gastroenterology and Hepatology [facility]

## 2021-09-13 ENCOUNTER — Other Ambulatory Visit: Payer: Self-pay | Admitting: Adult Health

## 2021-09-14 ENCOUNTER — Telehealth: Payer: Self-pay

## 2021-09-14 ENCOUNTER — Ambulatory Visit (INDEPENDENT_AMBULATORY_CARE_PROVIDER_SITE_OTHER): Payer: BC Managed Care – PPO

## 2021-09-14 ENCOUNTER — Other Ambulatory Visit: Payer: Self-pay

## 2021-09-14 DIAGNOSIS — Z3042 Encounter for surveillance of injectable contraceptive: Secondary | ICD-10-CM

## 2021-09-14 MED ORDER — MEDROXYPROGESTERONE ACETATE 150 MG/ML IM SUSP
150.0000 mg | Freq: Once | INTRAMUSCULAR | Status: AC
Start: 2021-09-14 — End: 2021-09-14
  Administered 2021-09-14: 150 mg via INTRAMUSCULAR

## 2021-09-14 NOTE — Progress Notes (Signed)
   NURSE VISIT- INJECTION  SUBJECTIVE:  Karla Ward is a 50 y.o. [phone] female here for a Depo Provera for contraception/period management. She is a GYN patient.   OBJECTIVE:  There were no vitals taken for this visit.  Appears well, in no apparent distress  Injection administered in: Right deltoid  Meds ordered this encounter  Medications   medroxyPROGESTERone (DEPO-PROVERA) injection 150 mg    ASSESSMENT: GYN patient Depo Provera for contraception/period management PLAN: Follow-up: in 11-13 weeks for next Depo   Karla Ward A [name]  09/14/2021 9:48 AM

## 2021-09-14 NOTE — Telephone Encounter (Signed)
Pt was seen at Depo injection appt and wanted a bone density test d/t concern over bone changes with Depo. Cyril Mourning stated for pt to take supplemental Calcium and Vitamin D. Pt wants to proceed with bone density test if insurance will cover it.

## 2021-09-14 NOTE — Telephone Encounter (Signed)
Told pt I will order bone density, since on depo, not sure if insurance will cover, but she wants, encouraged to take vitamin D and calcium, gave her number to call for appt at [facility]

## 2021-09-14 NOTE — Addendum Note (Signed)
Addended by: Cyril Mourning A on: 09/14/2021 11:10 AM   Modules accepted: Orders

## 2021-09-21 ENCOUNTER — Telehealth: Payer: Self-pay

## 2021-09-21 NOTE — Telephone Encounter (Signed)
Documentation from [facility] stating pt is on both Omeprazole and Pantoprazole. Pt was put on Pantoprazole after a visit from an Urgent Care and the medication did not help her. During a office visit she advised Dr. Marletta Lor of this and he put her on Omeprazole and she still continue to do better.

## 2021-09-30 ENCOUNTER — Ambulatory Visit: Payer: BC Managed Care – PPO | Admitting: Internal Medicine

## 2021-10-06 ENCOUNTER — Other Ambulatory Visit: Payer: Self-pay | Admitting: Family Medicine

## 2021-10-06 DIAGNOSIS — Z1231 Encounter for screening mammogram for malignant neoplasm of breast: Secondary | ICD-10-CM

## 2021-11-02 ENCOUNTER — Encounter: Payer: Self-pay | Admitting: Diagnostic Neuroimaging

## 2021-11-02 ENCOUNTER — Ambulatory Visit: Payer: BC Managed Care – PPO | Admitting: Diagnostic Neuroimaging

## 2021-11-02 VITALS — BP 122/84 | HR 75 | Ht 64.0 in | Wt 199.0 lb

## 2021-11-02 DIAGNOSIS — H8103 Meniere's disease, bilateral: Secondary | ICD-10-CM | POA: Diagnosis not present

## 2021-11-02 DIAGNOSIS — M79605 Pain in left leg: Secondary | ICD-10-CM | POA: Diagnosis not present

## 2021-11-02 DIAGNOSIS — M79604 Pain in right leg: Secondary | ICD-10-CM

## 2021-11-02 NOTE — Progress Notes (Signed)
[facility]  PATIENT: Karla Ward DOB: Nov 18, 1970  REFERRING CLINICIAN: Gareth Morgan, MD HISTORY FROM: patient  REASON FOR VISIT: new consult    HISTORICAL  CHIEF COMPLAINT:  Chief Complaint  Patient presents with   New Patient (Initial Visit)    Rm 6 alone pt reports she is here to discuss worsening numbness in bilateral legs. Left is worse than the right. Reports sx have been going on since Nov of 2022.     HISTORY OF PRESENT ILLNESS:   UPDATE (11/02/21, VRP): Since last visit, doing well from vertigo issues. Now with int leg and low back pain (left worse than right). Aching pain in let leg below knee. Some intermittent numbness.   PRIOR HPI: 51 year old female here for evaluation of vertigo.  Symptoms started around 2009 and patient saw ENT for evaluation.  She had MRI of the brain was unremarkable.  She was diagnosed with Mnire's disease.  Patient has had tinnitus and hearing loss over the years which has fluctuated.  She is tried meclizine without relief.  Symptoms have been worsening lately and she saw ENT again who ordered MRI of the brain without contrast in 2022 which was unremarkable except for subtle changes in the left IAC.  Postcontrast imaging was recommended.   REVIEW OF SYSTEMS: Full 14 system review of systems performed and negative with exception of: as per HPI.  ALLERGIES: Allergies  Allergen Reactions   Benadryl [Diphenhydramine Hcl (Sleep)]     tachycardia   Ciprofloxacin    Ibuprofen    Penicillins Hives    Has patient had a PCN reaction causing immediate rash, facial/tongue/throat swelling, SOB or lightheadedness with hypotension: pt does not remember  Has patient had a PCN reaction causing severe rash involving mucus membranes or skin necrosis: No Has patient had a PCN reaction that required hospitalization No Has patient had a PCN reaction occurring within the last 10 years: No     Vitamin D Analogs     Unable to  tolerate 50000 dose    HOME MEDICATIONS: Outpatient Medications Prior to Visit  Medication Sig Dispense Refill   Albuterol Sulfate, sensor, (PROAIR DIGIHALER) 108 (90 Base) MCG/ACT AEPB Inhale into the lungs.     amLODipine (NORVASC) 5 MG tablet Take 5 mg by mouth daily.     cetirizine (ZYRTEC) 10 MG tablet Take 10 mg by mouth daily.     fluticasone (FLONASE) 50 MCG/ACT nasal spray Place 2 sprays into both nostrils daily.     meclizine (ANTIVERT) 25 MG tablet Take 50 mg by mouth 2 (two) times daily as needed.     omeprazole (PRILOSEC) 20 MG capsule Take 1 capsule (20 mg total) by mouth 2 (two) times daily before a meal. 60 capsule 5   ondansetron (ZOFRAN ODT) 4 MG disintegrating tablet Take 1 tablet (4 mg total) by mouth every 8 (eight) hours as needed for nausea or vomiting. 15 tablet 0   PSEUDOEPHEDRINE-ACETAMINOPHEN PO Take 60 mg by mouth.     VITAMIN D PO Take by mouth.     No facility-administered medications prior to visit.    PAST MEDICAL HISTORY: Past Medical History:  Diagnosis Date   Anemia    Dizziness    Gastritis    GERD (gastroesophageal reflux disease)    Hypertension    Iron deficiency    Meniere disease    Status post dilation of esophageal narrowing    Vertigo    Vitamin D deficiency     PAST  SURGICAL HISTORY: Past Surgical History:  Procedure Laterality Date   DILITATION & CURRETTAGE/HYSTROSCOPY WITH NOVASURE ABLATION N/A 04/05/2017   Procedure: DILATATION & CURETTAGE/HYSTEROSCOPY WITH NOVASURE ENDOMETRIAL ABLATION;  Surgeon: Lazaro ArmsEure, Luther H, MD;  Location: [facility];  Service: Gynecology;  Laterality: N/A;   ESOPHAGOGASTRODUODENOSCOPY N/A 09/02/2015   ONG:EXBMSLF:mild non-erosive gastritis/stricture at the gastroesophageal junction    NONE TO DATE  08/13/15   SAVORY DILATION  09/02/2015   Procedure: SAVORY DILATION;  Surgeon: West BaliSandi L Fields, MD;  Location: [facility];  Service: Endoscopy;;    FAMILY HISTORY: Family History  Problem Relation Age of Onset    Throat cancer Father [id number]   Hypertension Father    Hypertension Mother    Congestive Heart Failure Sister    Colon cancer Neg Hx    Colon polyps Neg Hx     SOCIAL HISTORY: Social History   Socioeconomic History   Marital status: Married    Spouse name: Not on file   Number of children: 2   Years of education: Not on file   Highest education level: Not on file  Occupational History   Not on file  Tobacco Use   Smoking status: Never   Smokeless tobacco: Never  Vaping Use   Vaping Use: Never used  Substance and Sexual Activity   Alcohol use: No   Drug use: No   Sexual activity: Yes    Birth control/protection: Surgical, Injection    Comment: ablation  Other Topics Concern   Not on file  Social History Narrative   Lives with husband   Right handed   Caffeine- some    Social Determinants of Health   Financial Resource Strain: Low Risk    Difficulty of Paying Living Expenses: Not hard at all  Food Insecurity: No Food Insecurity   Worried About Programme researcher, broadcasting/film/videounning Out of Food in the Last Year: Never true   Ran Out of Food in the Last Year: Never true  Transportation Needs: No Transportation Needs   Lack of Transportation (Medical): No   Lack of Transportation (Non-Medical): No  Physical Activity: Insufficiently Active   Days of Exercise per Week: 3 days   Minutes of Exercise per Session: 30 min  Stress: No Stress Concern Present   Feeling of Stress : Only a little  Social Connections: Press photographerocially Integrated   Frequency of Communication with Friends and Family: More than three times a week   Frequency of Social Gatherings with Friends and Family: Twice a week   Attends Religious Services: More than 4 times per year   Active Member of [employer] or Organizations: Yes   Attends BankerClub or Organization Meetings: 1 to 4 times per year   Marital Status: Married  Catering managerntimate Partner Violence: Not At Risk   Fear of Current or Ex-Partner: No   Emotionally Abused: No   Physically Abused: No    Sexually Abused: No     PHYSICAL EXAM  GENERAL EXAM/CONSTITUTIONAL: Vitals:  Vitals:   11/02/21 0939  BP: 122/84  Pulse: 75  SpO2: 99%  Weight: 199 lb (90.3 kg)  Height: 5\' 4"  (1.626 m)   Body mass index is 34.16 kg/m. Wt Readings from Last 3 Encounters:  11/02/21 199 lb (90.3 kg)  09/02/21 197 lb 6.4 oz (89.5 kg)  08/30/21 198 lb 6.4 oz (90 kg)   Patient is in no distress; well developed, nourished and groomed; neck is supple  CARDIOVASCULAR: Examination of carotid arteries is normal; no carotid bruits Regular rate and rhythm, no murmurs  Examination of peripheral vascular system by observation and palpation is normal  EYES: Ophthalmoscopic exam of optic discs and posterior segments is normal; no papilledema or hemorrhages No results found.  MUSCULOSKELETAL: Gait, strength, tone, movements noted in Neurologic exam below  NEUROLOGIC: MENTAL STATUS:  No flowsheet data found. awake, alert, oriented to person, place and time recent and remote memory intact normal attention and concentration language fluent, comprehension intact, naming intact fund of knowledge appropriate  CRANIAL NERVE:  2nd - no papilledema on fundoscopic exam 2nd, 3rd, 4th, 6th - pupils equal and reactive to light, visual fields full to confrontation, extraocular muscles intact, no nystagmus 5th - facial sensation symmetric 7th - facial strength symmetric 8th - hearing intact 9th - palate elevates symmetrically, uvula midline 11th - shoulder shrug symmetric 12th - tongue protrusion midline  MOTOR:  normal bulk and tone, full strength in the BUE, BLE  SENSORY:  normal and symmetric to light touch, temperature, vibration  COORDINATION:  finger-nose-finger, fine finger movements normal  REFLEXES:  deep tendon reflexes TRACE and symmetric  GAIT/STATION:  narrow based gait     DIAGNOSTIC DATA (LABS, IMAGING, TESTING) - I reviewed patient records, labs, notes, testing and imaging  myself where available.  Lab Results  Component Value Date   WBC 13.1 (H) 01/01/2021   HGB 13.7 01/01/2021   HCT 42.5 01/01/2021   MCV 90.2 01/01/2021   PLT 322 01/01/2021      Component Value Date/Time   NA 144 08/30/2021 1500   K 4.5 08/30/2021 1500   CL 106 08/30/2021 1500   CO2 20 08/30/2021 1500   GLUCOSE 74 08/30/2021 1500   GLUCOSE 100 (H) 01/01/2021 0114   BUN 8 08/30/2021 1500   CREATININE 1.01 (H) 08/30/2021 1500   CALCIUM 10.0 08/30/2021 1500   PROT 6.7 01/11/2019 0932   ALBUMIN 3.6 01/11/2019 0932   AST 18 01/11/2019 0932   ALT 20 01/11/2019 0932   ALKPHOS 47 01/11/2019 0932   BILITOT 0.7 01/11/2019 0932   GFRNONAA >60 01/01/2021 0114   GFRAA >60 01/11/2019 0932   No results found for: CHOL, HDL, LDLCALC, LDLDIRECT, TRIG, CHOLHDL No results found for: [id number] No results found for: VITAMINB12 Lab Results  Component Value Date   TSH 1.240 10/09/2020   03/13/08 MRI brain w/wo - Normal examination   01/11/17 LLE u/s - No evidence of deep venous thrombosis in the left lower extremity.  04/04/21 MRI brain (without) [I reviewed images myself and agree with interpretation. -VRP]  - Normal appearance of the brain. This study was ordered and performed without contrast. High-resolution T2 imaging raises the question of slight thickening of the seventh and 8th nerve complex in the internal auditory canal on the left. Particularly if this is the side of symptoms, a small vestibular schwannoma is not excluded and could be evaluated more accurately with postcontrast imaging.    ASSESSMENT AND PLAN  51 y.o. year old female here with:  Dx:  1. Pain in both lower extremities   2. Meniere's disease of both ears       PLAN:  PAIN / ACHING IN LEGS (left worse than right; intermittent) - likely musculoskeletal strain; some intermittent numbness - neuro exam is normal; recommend conservative mgmt with PT referral  MENIERE'S DISEASE (vertigo, tinnitus; since  ~2009) - follow up with ENT; consider post-contrast IAC protocol MRI although patient thinks that she had MRI dye reaction in the past (cannot find documentation of this; she had MRI brain w/wo in 2009  without mention of reaction); no other neurologic etiologies found  Orders Placed This Encounter  Procedures   Ambulatory referral to Physical Therapy   Return for return to PCP.    Suanne Marker, MD 11/02/2021, 10:19 AM Certified in Neurology, Neurophysiology and Neuroimaging  [facility] [address], [address] [place], [place] [address] [phone]

## 2021-11-05 ENCOUNTER — Ambulatory Visit
Admission: RE | Admit: 2021-11-05 | Discharge: 2021-11-05 | Disposition: A | Discharge status: Home / Self Care | Payer: BC Managed Care – PPO | Source: Ambulatory Visit | Attending: Family Medicine | Admitting: Family Medicine

## 2021-11-05 DIAGNOSIS — Z1231 Encounter for screening mammogram for malignant neoplasm of breast: Secondary | ICD-10-CM

## 2021-11-05 DIAGNOSIS — H9042 Sensorineural hearing loss, unilateral, left ear, with unrestricted hearing on the contralateral side: Secondary | ICD-10-CM | POA: Diagnosis not present

## 2021-11-05 DIAGNOSIS — R42 Dizziness and giddiness: Secondary | ICD-10-CM | POA: Diagnosis not present

## 2021-11-05 DIAGNOSIS — H8102 Meniere's disease, left ear: Secondary | ICD-10-CM | POA: Diagnosis not present

## 2021-11-09 DIAGNOSIS — R42 Dizziness and giddiness: Secondary | ICD-10-CM | POA: Diagnosis not present

## 2021-11-09 DIAGNOSIS — E559 Vitamin D deficiency, unspecified: Secondary | ICD-10-CM | POA: Diagnosis not present

## 2021-11-09 DIAGNOSIS — M545 Low back pain, unspecified: Secondary | ICD-10-CM | POA: Diagnosis not present

## 2021-11-19 ENCOUNTER — Encounter (HOSPITAL_COMMUNITY): Payer: Self-pay

## 2021-12-07 ENCOUNTER — Other Ambulatory Visit: Payer: Self-pay

## 2021-12-07 ENCOUNTER — Ambulatory Visit (INDEPENDENT_AMBULATORY_CARE_PROVIDER_SITE_OTHER): Payer: BC Managed Care – PPO

## 2021-12-07 DIAGNOSIS — Z3042 Encounter for surveillance of injectable contraceptive: Secondary | ICD-10-CM | POA: Diagnosis not present

## 2021-12-07 MED ORDER — MEDROXYPROGESTERONE ACETATE 150 MG/ML IM SUSP
150.0000 mg | Freq: Once | INTRAMUSCULAR | Status: AC
  Administered 2021-12-07: 150 mg via INTRAMUSCULAR

## 2021-12-07 NOTE — Progress Notes (Signed)
° °  NURSE VISIT- INJECTION  SUBJECTIVE:  Karla Ward is a 51 y.o. [phone] female here for a Depo Provera for contraception/period management. She is a GYN patient.   OBJECTIVE:  There were no vitals taken for this visit.  Appears well, in no apparent distress  Injection administered in: Left deltoid  Meds ordered this encounter  Medications   medroxyPROGESTERone (DEPO-PROVERA) injection 150 mg    ASSESSMENT: GYN patient Depo Provera for contraception/period management PLAN: Follow-up: in 11-13 weeks for next Depo   Tanairy Payeur A Sole Lengacher  12/07/2021 9:47 AM

## 2021-12-21 DIAGNOSIS — R519 Headache, unspecified: Secondary | ICD-10-CM | POA: Diagnosis not present

## 2021-12-21 DIAGNOSIS — Z880 Allergy status to penicillin: Secondary | ICD-10-CM | POA: Diagnosis not present

## 2021-12-21 DIAGNOSIS — R42 Dizziness and giddiness: Secondary | ICD-10-CM | POA: Diagnosis not present

## 2021-12-22 ENCOUNTER — Ambulatory Visit: Payer: BC Managed Care – PPO | Admitting: Internal Medicine

## 2021-12-22 ENCOUNTER — Telehealth: Payer: Self-pay | Admitting: Diagnostic Neuroimaging

## 2021-12-22 ENCOUNTER — Encounter: Payer: Self-pay | Admitting: Internal Medicine

## 2021-12-22 ENCOUNTER — Telehealth: Payer: Self-pay | Admitting: *Deleted

## 2021-12-22 ENCOUNTER — Other Ambulatory Visit: Payer: Self-pay

## 2021-12-22 VITALS — BP 131/82 | HR 72 | Temp 97.1°F | Ht 64.0 in | Wt 199.0 lb

## 2021-12-22 DIAGNOSIS — Z1211 Encounter for screening for malignant neoplasm of colon: Secondary | ICD-10-CM

## 2021-12-22 DIAGNOSIS — G8929 Other chronic pain: Secondary | ICD-10-CM

## 2021-12-22 DIAGNOSIS — K219 Gastro-esophageal reflux disease without esophagitis: Secondary | ICD-10-CM | POA: Diagnosis not present

## 2021-12-22 DIAGNOSIS — R1013 Epigastric pain: Secondary | ICD-10-CM | POA: Diagnosis not present

## 2021-12-22 MED ORDER — SUCRALFATE 1 G PO TABS: 1.0000 g | ORAL_TABLET | Freq: Three times a day (TID) | ORAL | 1 refills | Status: DC

## 2021-12-22 NOTE — Telephone Encounter (Signed)
Pt went to the ER yesterday 12/21/21 due to having headaches. ER instructed pt to see PCP. Pt called PCP, because did not have a sooner appt recommended to see neurologist.   Informed Pt have not been seen for headaches and would need a referral. Pt said, PCP do not have an appt.   I Karla Ward) sent message to nurse to confirm pt needed referral.   Nurse said, yes per ER note, needs to f/u with PCP first. can discuss referral at that time. This is the note I see Discharge Instructions Karla Pigg, PA - 12/21/2021 7:25 PM EST   Formatting of this note might be different from the original. Rest, drink plenty of fluids, you may continue your usual medications as before, you may use Flonase nasal spray as directed available over-the-counter, follow-up with your primary care provider for further evaluation, return emergency department for any worsening symptoms or concerns. Electronically signed by Karla Pigg, PA at 12/21/2021 7:25 PM EST   Informed pt of ER note. Pt stated, ok and hung up.

## 2021-12-22 NOTE — Telephone Encounter (Signed)
Karla Ward, you are scheduled for a virtual visit with your provider today.  Just as we do with appointments in the office, we must obtain your consent to participate.  Your consent will be active for this visit and any virtual visit you may have with one of our providers in the next 365 days.  If you have a MyChart account, I can also send a copy of this consent to you electronically.  All virtual visits are billed to your insurance company just like a traditional visit in the office.  As this is a virtual visit, video technology does not allow for your provider to perform a traditional examination.  This may limit your provider's ability to fully assess your condition.  If your provider identifies any concerns that need to be evaluated in person or the need to arrange testing such as labs, EKG, etc, we will make arrangements to do so.  Although advances in technology are sophisticated, we cannot ensure that it will always work on either your end or our end.  If the connection with a video visit is poor, we may have to switch to a telephone visit.  With either a video or telephone visit, we are not always able to ensure that we have a secure connection.   I need to obtain your verbal consent now.   Are you willing to proceed with your visit today?

## 2021-12-22 NOTE — Progress Notes (Signed)
Referring Provider: Gareth Morgan, MD Primary Care Physician:  Gareth Morgan, MD Primary GI:  Dr. Marletta Lor  Chief Complaint  Patient presents with   Abdominal Pain    Pain started Sunday but better today. Mid abd    HPI:   Karla Ward is a 51 y.o. female who presents for follow-up visit.  Patient   Has a history of chronic GERD with gastritis.  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 pylori negative gastritis.   Currently maintained on omeprazole 20 mg daily for chronic reflux which is relatively well controlled.  However she states she has intermittent episodes of epigastric abdominal pain.  States this happened 4 to 5 days ago after eating at [employer], eating tomato based sauce.  No previous colonoscopy.  No family history of colorectal malignancy.  No unintentional weight loss.  Past Medical History:  Diagnosis Date   Anemia    Dizziness    Gastritis    GERD (gastroesophageal reflux disease)    Hypertension    Iron deficiency    Meniere disease    Status post dilation of esophageal narrowing    Vertigo    Vitamin D deficiency     Past Surgical History:  Procedure Laterality Date   DILITATION & CURRETTAGE/HYSTROSCOPY WITH NOVASURE ABLATION N/A 04/05/2017   Procedure: DILATATION & CURETTAGE/HYSTEROSCOPY WITH NOVASURE ENDOMETRIAL ABLATION;  Surgeon: Lazaro Arms, MD;  Location: [facility];  Service: Gynecology;  Laterality: N/A;   ESOPHAGOGASTRODUODENOSCOPY N/A 09/02/2015   SWN:IOEV non-erosive gastritis/stricture at the gastroesophageal junction    NONE TO DATE  08/13/15   SAVORY DILATION  09/02/2015   Procedure: SAVORY DILATION;  Surgeon: West Bali, MD;  Location: [facility];  Service: Endoscopy;;    Current Outpatient Medications  Medication Sig Dispense Refill   Albuterol Sulfate, sensor, (PROAIR DIGIHALER) 108 (90 Base) MCG/ACT AEPB Inhale into the lungs as  needed.     amLODipine (NORVASC) 5 MG tablet Take 5 mg by mouth daily.     cetirizine (ZYRTEC) 10 MG tablet Take 10 mg by mouth daily.     fluticasone (FLONASE) 50 MCG/ACT nasal spray Place 2 sprays into both nostrils daily.     meclizine (ANTIVERT) 25 MG tablet Take 50 mg by mouth 2 (two) times daily as needed.     omeprazole (PRILOSEC) 20 MG capsule Take 1 capsule (20 mg total) by mouth 2 (two) times daily before a meal. 60 capsule 5   ondansetron (ZOFRAN ODT) 4 MG disintegrating tablet Take 1 tablet (4 mg total) by mouth every 8 (eight) hours as needed for nausea or vomiting. 15 tablet 0   sucralfate (CARAFATE) 1 g tablet Take 1 tablet (1 g total) by mouth 4 (four) times daily -  with meals and at bedtime. 120 tablet 1   VITAMIN D PO Take by mouth daily.     PSEUDOEPHEDRINE-ACETAMINOPHEN PO Take 60 mg by mouth. (Patient not taking: Reported on 12/22/2021)     No current facility-administered medications for this visit.    Allergies as of 12/22/2021 - Review Complete 12/22/2021  Allergen Reaction Noted   Benadryl [diphenhydramine hcl (sleep)]  12/26/2014   Ciprofloxacin  04/29/2021   Ibuprofen  04/29/2021   Penicillins Hives 11/19/2012   Vitamin d analogs  04/29/2021    Family History  Problem Relation Age of Onset   Throat cancer Father 75   Hypertension Father  Hypertension Mother    Congestive Heart Failure Sister    Colon cancer Neg Hx    Colon polyps Neg Hx     Social History   Socioeconomic History   Marital status: Married    Spouse name: Not on file   Number of children: 2   Years of education: Not on file   Highest education level: Not on file  Occupational History   Not on file  Tobacco Use   Smoking status: Never   Smokeless tobacco: Never  Vaping Use   Vaping Use: Never used  Substance and Sexual Activity   Alcohol use: No   Drug use: No   Sexual activity: Yes    Birth control/protection: Surgical, Injection    Comment: ablation  Other Topics  Concern   Not on file  Social History Narrative   Lives with husband   Right handed   Caffeine- some    Social Determinants of Health   Financial Resource Strain: Low Risk    Difficulty of Paying Living Expenses: Not hard at all  Food Insecurity: No Food Insecurity   Worried About Programme researcher, broadcasting/film/video in the Last Year: Never true   Ran Out of Food in the Last Year: Never true  Transportation Needs: No Transportation Needs   Lack of Transportation (Medical): No   Lack of Transportation (Non-Medical): No  Physical Activity: Insufficiently Active   Days of Exercise per Week: 3 days   Minutes of Exercise per Session: 30 min  Stress: No Stress Concern Present   Feeling of Stress : Only a little  Social Connections: Press photographer of Communication with Friends and Family: More than three times a week   Frequency of Social Gatherings with Friends and Family: Twice a week   Attends Religious Services: More than 4 times per year   Active Member of [employer] or Organizations: Yes   Attends Banker Meetings: 1 to 4 times per year   Marital Status: Married    Subjective: Review of Systems  Constitutional:  Negative for chills and fever.  HENT:  Negative for congestion and hearing loss.   Eyes:  Negative for blurred vision and double vision.  Respiratory:  Negative for cough and shortness of breath.   Cardiovascular:  Negative for chest pain and palpitations.  Gastrointestinal:  Positive for abdominal pain and heartburn. Negative for blood in stool, constipation, diarrhea, melena and vomiting.  Genitourinary:  Negative for dysuria and urgency.  Musculoskeletal:  Negative for joint pain and myalgias.  Skin:  Negative for itching and rash.  Neurological:  Negative for dizziness and headaches.  Psychiatric/Behavioral:  Negative for depression. The patient is not nervous/anxious.     Objective: BP 131/82    Pulse 72    Temp (!) 97.1 F (36.2 C) (Temporal)    Ht  5\' 4"  (1.626 m)    Wt 199 lb (90.3 kg)    BMI 34.16 kg/m  Physical Exam Constitutional:      Appearance: Normal appearance.  HENT:     Head: Normocephalic and atraumatic.  Eyes:     Extraocular Movements: Extraocular movements intact.     Conjunctiva/sclera: Conjunctivae normal.  Cardiovascular:     Rate and Rhythm: Normal rate and regular rhythm.  Pulmonary:     Effort: Pulmonary effort is normal.     Breath sounds: Normal breath sounds.  Abdominal:     General: Bowel sounds are normal.     Palpations: Abdomen is soft.  Musculoskeletal:        General: No swelling. Normal range of motion.     Cervical back: Normal range of motion and neck supple.  Skin:    General: Skin is warm and dry.     Coloration: Skin is not jaundiced.  Neurological:     General: No focal deficit present.     Mental Status: She is alert and oriented to person, place, and time.  Psychiatric:        Mood and Affect: Mood normal.        Behavior: Behavior normal.     Assessment: *GERD-improved on omeprazole *Epigastric pain *Colon cancer screening  Plan: Patient's upper GI symptoms have improved overall since starting omeprazole.  Continues to have intermittent breakthrough symptoms with associated epigastric pain.  Given her epigastric pain, would recommend EGD to further evaluate.  She states she would like to hold off for now but will call our office to schedule in a few months.  In the meantime I will send in Carafate to take as needed for breakthrough symptoms.  Patient also due for screening colonoscopy.  We will schedule this at the same time as EGD when she is ready.  ASA 2.  Otherwise follow-up in 6 months or sooner if needed.  12/22/2021 9:04 AM   Disclaimer: This note was dictated with voice recognition software. Similar sounding words can inadvertently be transcribed and may not be corrected upon review.

## 2021-12-22 NOTE — Patient Instructions (Signed)
Continue on omeprazole daily for your chronic reflux.  I will send in an additional medication called Carafate to take up to 4 times a day as needed for breakthrough symptoms.  Please call our office when you are ready to schedule your EGD and colonoscopy and we will get you scheduled  Otherwise follow-up in 6 months.  It was great seeing you again today.  Dr. Marletta Ward  At [facility] we value your feedback. You may receive a survey about your visit today. Please share your experience as we strive to create trusting relationships with our patients to provide genuine, compassionate, quality care.  We appreciate your understanding and patience as we review any laboratory studies, imaging, and other diagnostic tests that are ordered as we care for you. Our office policy is 5 business days for review of these results, and any emergent or urgent results are addressed in a timely manner for your best interest. If you do not hear from our office in 1 week, please contact us.   We also encourage the use of MyChart, which contains your medical information for your review as well. If you are not enrolled in this feature, an access code is on this after visit summary for your convenience. Thank you for allowing [place] to be involved in your care.  It was great to see you today!  I hope you have a great rest of your Winter!    Karla Ward, D.O. Gastroenterology and Hepatology [facility]

## 2021-12-22 NOTE — Telephone Encounter (Signed)
Pt consented to a virtual visit. 

## 2021-12-23 ENCOUNTER — Telehealth: Payer: BC Managed Care – PPO | Admitting: Physician Assistant

## 2021-12-23 ENCOUNTER — Encounter: Payer: Self-pay | Admitting: Diagnostic Neuroimaging

## 2021-12-23 ENCOUNTER — Encounter: Payer: Self-pay | Admitting: Internal Medicine

## 2021-12-23 DIAGNOSIS — J019 Acute sinusitis, unspecified: Secondary | ICD-10-CM | POA: Diagnosis not present

## 2021-12-23 DIAGNOSIS — B9689 Other specified bacterial agents as the cause of diseases classified elsewhere: Secondary | ICD-10-CM | POA: Diagnosis not present

## 2021-12-23 MED ORDER — DOXYCYCLINE HYCLATE 100 MG PO TABS: 100.0000 mg | ORAL_TABLET | Freq: Two times a day (BID) | ORAL | 0 refills | Status: DC

## 2021-12-23 MED ORDER — PREDNISONE 10 MG (21) PO TBPK: ORAL_TABLET | ORAL | 0 refills | Status: DC

## 2021-12-23 NOTE — Patient Instructions (Signed)
Jeronimo Norma, thank you for joining [name], PA-C for today's virtual visit.  While this provider is not your primary care provider (PCP), if your PCP is located in our provider database this encounter information will be shared with them immediately following your visit.  Consent: (Patient) Karla Ward provided verbal consent for this virtual visit at the beginning of the encounter.  Current Medications:  Current Outpatient Medications:    doxycycline (VIBRA-TABS) 100 MG tablet, Take 1 tablet (100 mg total) by mouth 2 (two) times daily., Disp: 20 tablet, Rfl: 0   predniSONE (STERAPRED UNI-PAK 21 TAB) 10 MG (21) TBPK tablet, 6 day taper; take as directed on package instructions, Disp: 21 tablet, Rfl: 0   Albuterol Sulfate, sensor, (PROAIR DIGIHALER) 108 (90 Base) MCG/ACT AEPB, Inhale into the lungs as needed., Disp: , Rfl:    amLODipine (NORVASC) 5 MG tablet, Take 5 mg by mouth daily., Disp: , Rfl:    cetirizine (ZYRTEC) 10 MG tablet, Take 10 mg by mouth daily., Disp: , Rfl:    fluticasone (FLONASE) 50 MCG/ACT nasal spray, Place 2 sprays into both nostrils daily., Disp: , Rfl:    meclizine (ANTIVERT) 25 MG tablet, Take 50 mg by mouth 2 (two) times daily as needed., Disp: , Rfl:    omeprazole (PRILOSEC) 20 MG capsule, Take 1 capsule (20 mg total) by mouth 2 (two) times daily before a meal., Disp: 60 capsule, Rfl: 5   ondansetron (ZOFRAN ODT) 4 MG disintegrating tablet, Take 1 tablet (4 mg total) by mouth every 8 (eight) hours as needed for nausea or vomiting., Disp: 15 tablet, Rfl: 0   PSEUDOEPHEDRINE-ACETAMINOPHEN PO, Take 60 mg by mouth. (Patient not taking: Reported on 12/22/2021), Disp: , Rfl:    sucralfate (CARAFATE) 1 g tablet, Take 1 tablet (1 g total) by mouth 4 (four) times daily -  with meals and at bedtime., Disp: 120 tablet, Rfl: 1   VITAMIN D PO, Take by mouth daily., Disp: , Rfl:    Medications ordered in this encounter:  Meds ordered this  encounter  Medications   doxycycline (VIBRA-TABS) 100 MG tablet    Sig: Take 1 tablet (100 mg total) by mouth 2 (two) times daily.    Dispense:  20 tablet    Refill:  0    Order Specific Question:   Supervising Provider    Answer:   MILLER, BRIAN [[id number]]   predniSONE (STERAPRED UNI-PAK 21 TAB) 10 MG (21) TBPK tablet    Sig: 6 day taper; take as directed on package instructions    Dispense:  21 tablet    Refill:  0    Order Specific Question:   Supervising Provider    Answer:   Hyacinth Meeker, BRIAN [[id number]]     *If you need refills on other medications prior to your next appointment, please contact your pharmacy*  Follow-Up: Call back or seek an in-person evaluation if the symptoms worsen or if the condition fails to improve as anticipated.  Other Instructions Sinusitis, Adult Sinusitis is inflammation of your sinuses. Sinuses are hollow spaces in the bones around your face. Your sinuses are located: Around your eyes. In the middle of your forehead. Behind your nose. In your cheekbones. Mucus normally drains out of your sinuses. When your nasal tissues become inflamed or swollen, mucus can become trapped or blocked. This allows bacteria, viruses, and fungi to grow, which leads to infection. Most infections of the sinuses are caused by a virus. Sinusitis can develop quickly. It  can last for up to 4 weeks (acute) or for more than 12 weeks (chronic). Sinusitis often develops after a cold. What are the causes? This condition is caused by anything that creates swelling in the sinuses or stops mucus from draining. This includes: Allergies. Asthma. Infection from bacteria or viruses. Deformities or blockages in your nose or sinuses. Abnormal growths in the nose (nasal polyps). Pollutants, such as chemicals or irritants in the air. Infection from fungi (rare). What increases the risk? You are more likely to develop this condition if you: Have a weak body defense system (immune system). Do a  lot of swimming or diving. Overuse nasal sprays. Smoke. What are the signs or symptoms? The main symptoms of this condition are pain and a feeling of pressure around the affected sinuses. Other symptoms include: Stuffy nose or congestion. Thick drainage from your nose. Swelling and warmth over the affected sinuses. Headache. Upper toothache. A cough that may get worse at night. Extra mucus that collects in the throat or the back of the nose (postnasal drip). Decreased sense of smell and taste. Fatigue. A fever. Sore throat. Bad breath. How is this diagnosed? This condition is diagnosed based on: Your symptoms. Your medical history. A physical exam. Tests to find out if your condition is acute or chronic. This may include: Checking your nose for nasal polyps. Viewing your sinuses using a device that has a light (endoscope). Testing for allergies or bacteria. Imaging tests, such as an MRI or CT scan. In rare cases, a bone biopsy may be done to rule out more serious types of fungal sinus disease. How is this treated? Treatment for sinusitis depends on the cause and whether your condition is chronic or acute. If caused by a virus, your symptoms should go away on their own within 10 days. You may be given medicines to relieve symptoms. They include: Medicines that shrink swollen nasal passages (topical intranasal decongestants). Medicines that treat allergies (antihistamines). A spray that eases inflammation of the nostrils (topical intranasal corticosteroids). Rinses that help get rid of thick mucus in your nose (nasal saline washes). If caused by bacteria, your health care provider may recommend waiting to see if your symptoms improve. Most bacterial infections will get better without antibiotic medicine. You may be given antibiotics if you have: A severe infection. A weak immune system. If caused by narrow nasal passages or nasal polyps, you may need to have surgery. Follow these  instructions at home: Medicines Take, use, or apply over-the-counter and prescription medicines only as told by your health care provider. These may include nasal sprays. If you were prescribed an antibiotic medicine, take it as told by your health care provider. Do not stop taking the antibiotic even if you start to feel better. Hydrate and humidify  Drink enough fluid to keep your urine pale yellow. Staying hydrated will help to thin your mucus. Use a cool mist humidifier to keep the humidity level in your home above 50%. Inhale steam for 10-15 minutes, 3-4 times a day, or as told by your health care provider. You can do this in the bathroom while a hot shower is running. Limit your exposure to cool or dry air. Rest Rest as much as possible. Sleep with your head raised (elevated). Make sure you get enough sleep each night. General instructions  Apply a warm, moist washcloth to your face 3-4 times a day or as told by your health care provider. This will help with discomfort. Wash your hands often with  soap and water to reduce your exposure to germs. If soap and water are not available, use hand sanitizer. Do not smoke. Avoid being around people who are smoking (secondhand smoke). Keep all follow-up visits as told by your health care provider. This is important. Contact a health care provider if: You have a fever. Your symptoms get worse. Your symptoms do not improve within 10 days. Get help right away if: You have a severe headache. You have persistent vomiting. You have severe pain or swelling around your face or eyes. You have vision problems. You develop confusion. Your neck is stiff. You have trouble breathing. Summary Sinusitis is soreness and inflammation of your sinuses. Sinuses are hollow spaces in the bones around your face. This condition is caused by nasal tissues that become inflamed or swollen. The swelling traps or blocks the flow of mucus. This allows bacteria,  viruses, and fungi to grow, which leads to infection. If you were prescribed an antibiotic medicine, take it as told by your health care provider. Do not stop taking the antibiotic even if you start to feel better. Keep all follow-up visits as told by your health care provider. This is important. This information is not intended to replace advice given to you by your health care provider. Make sure you discuss any questions you have with your health care provider. Document Revised: 03/19/2018 Document Reviewed: 03/19/2018 Elsevier Patient Education  2022 [employer].    If you have been instructed to have an in-person evaluation today at a local Urgent Care facility, please use the link below. It will take you to a list of all of our available [facility], including address, phone number and hours of operation. Please do not delay care.  [facility]  If you or a family member do not have a primary care provider, use the link below to schedule a visit and establish care. When you choose a [facility] primary care physician or advanced practice provider, you gain a long-term partner in health. Find a Primary Care Provider  Learn more about [place]'s in-office and virtual care options: [place] - Get Care Now

## 2021-12-23 NOTE — Progress Notes (Signed)
Virtual Visit Consent   Atiyana Ward, you are scheduled for a virtual visit with a [facility] provider today.     Just as with appointments in the office, your consent must be obtained to participate.  Your consent will be active for this visit and any virtual visit you may have with one of our providers in the next 365 days.     If you have a MyChart account, a copy of this consent can be sent to you electronically.  All virtual visits are billed to your insurance company just like a traditional visit in the office.    As this is a virtual visit, video technology does not allow for your provider to perform a traditional examination.  This may limit your provider's ability to fully assess your condition.  If your provider identifies any concerns that need to be evaluated in person or the need to arrange testing (such as labs, EKG, etc.), we will make arrangements to do so.     Although advances in technology are sophisticated, we cannot ensure that it will always work on either your end or our end.  If the connection with a video visit is poor, the visit may have to be switched to a telephone visit.  With either a video or telephone visit, we are not always able to ensure that we have a secure connection.     I need to obtain your verbal consent now.   Are you willing to proceed with your visit today?    Karla Ward has provided verbal consent on 12/23/2021 for a virtual visit (video or telephone).   Karla Loveless, PA-C   Date: 12/23/2021 5:34 PM   Virtual Visit via Video Note   I, Karla Ward, connected with  Karla Ward  ([id number], Aug 17, 1961) on 12/23/21 at  5:30 PM EST by a video-enabled telemedicine application and verified that I am speaking with the correct person using two identifiers.  Location: Patient: Virtual Visit Location Patient: Home Provider: Virtual Visit Location Provider: Office/Clinic   I discussed the limitations of  evaluation and management by telemedicine and the availability of in person appointments. The patient expressed understanding and agreed to proceed.    History of Present Illness: Karla Ward is a 51 y.o. who identifies as a female who was assigned female at birth, and is being seen today for continued headache. Was seen in ER at [facility] on 12/21/21 for same issue. Had head CT that was unremarkable. Headache started Saturday night into Sunday. Head is sore to the touch. Has tried Motrin, tylenol and sinus medication without relief. Has had some pain in bilateral ears. Ear pain is alternating between ears. Having lots of ear pressure, feels like driving through the mountains. Having ear popping. Reports heads feels heavy.   Problems:  Patient Active Problem List   Diagnosis Date Noted   Encounter for surveillance of injectable contraceptive 06/21/2021   Encounter for screening fecal occult blood testing 06/21/2021   Encounter for well woman exam with routine gynecological exam 06/21/2021   Atypical squamous cell changes of undetermined significance (ASCUS) on cervical cytology with negative high risk human papilloma virus (HPV) test result 06/21/2021   Encounter for colorectal cancer screening 01/20/2020   Encounter for gynecological examination with Papanicolaou smear of cervix 01/20/2020   Colon cancer screening 07/06/2017   N&V (nausea and vomiting) 09/14/2016   Menorrhagia 05/30/2016   GERD (gastroesophageal reflux disease) 08/13/2015   Palpitations 11/22/2012  Allergies:  Allergies  Allergen Reactions   Benadryl [Diphenhydramine Hcl (Sleep)]     tachycardia   Ciprofloxacin    Ibuprofen    Penicillins Hives    Has patient had a PCN reaction causing immediate rash, facial/tongue/throat swelling, SOB or lightheadedness with hypotension: pt does not remember  Has patient had a PCN reaction causing severe rash involving mucus membranes or skin necrosis: No Has patient had a PCN  reaction that required hospitalization No Has patient had a PCN reaction occurring within the last 10 years: No     Vitamin D Analogs     Unable to tolerate 50000 dose   Medications:  Current Outpatient Medications:    doxycycline (VIBRA-TABS) 100 MG tablet, Take 1 tablet (100 mg total) by mouth 2 (two) times daily., Disp: 20 tablet, Rfl: 0   predniSONE (STERAPRED UNI-PAK 21 TAB) 10 MG (21) TBPK tablet, 6 day taper; take as directed on package instructions, Disp: 21 tablet, Rfl: 0   Albuterol Sulfate, sensor, (PROAIR DIGIHALER) 108 (90 Base) MCG/ACT AEPB, Inhale into the lungs as needed., Disp: , Rfl:    amLODipine (NORVASC) 5 MG tablet, Take 5 mg by mouth daily., Disp: , Rfl:    cetirizine (ZYRTEC) 10 MG tablet, Take 10 mg by mouth daily., Disp: , Rfl:    fluticasone (FLONASE) 50 MCG/ACT nasal spray, Place 2 sprays into both nostrils daily., Disp: , Rfl:    meclizine (ANTIVERT) 25 MG tablet, Take 50 mg by mouth 2 (two) times daily as needed., Disp: , Rfl:    omeprazole (PRILOSEC) 20 MG capsule, Take 1 capsule (20 mg total) by mouth 2 (two) times daily before a meal., Disp: 60 capsule, Rfl: 5   ondansetron (ZOFRAN ODT) 4 MG disintegrating tablet, Take 1 tablet (4 mg total) by mouth every 8 (eight) hours as needed for nausea or vomiting., Disp: 15 tablet, Rfl: 0   PSEUDOEPHEDRINE-ACETAMINOPHEN PO, Take 60 mg by mouth. (Patient not taking: Reported on 12/22/2021), Disp: , Rfl:    sucralfate (CARAFATE) 1 g tablet, Take 1 tablet (1 g total) by mouth 4 (four) times daily -  with meals and at bedtime., Disp: 120 tablet, Rfl: 1   VITAMIN D PO, Take by mouth daily., Disp: , Rfl:   Observations/Objective: Patient is well-developed, well-nourished in no acute distress.  Resting comfortably at home.  Head is normocephalic, atraumatic.  No labored breathing.  Speech is clear and coherent with logical content.  Patient is alert and oriented at baseline.    Assessment and Plan: 1. Acute bacterial  sinusitis - doxycycline (VIBRA-TABS) 100 MG tablet; Take 1 tablet (100 mg total) by mouth 2 (two) times daily.  Dispense: 20 tablet; Refill: 0 - predniSONE (STERAPRED UNI-PAK 21 TAB) 10 MG (21) TBPK tablet; 6 day taper; take as directed on package instructions  Dispense: 21 tablet; Refill: 0  - Worsening symptoms; Suspect sinusitis as possible source - Will give augmentin  - Continue allergy medications.  - Stay well hydrated and get plenty of rest.  - Seek in person evaluation if no symptom improvement or if symptoms worsen.  Follow Up Instructions: I discussed the assessment and treatment plan with the patient. The patient was provided an opportunity to ask questions and all were answered. The patient agreed with the plan and demonstrated an understanding of the instructions.  A copy of instructions were sent to the patient via MyChart unless otherwise noted below.   Patient has requested to receive PHI (AVS, Work Notes, etc) pertaining to  this video visit through e-mail as they are currently without active MyChart. They have voiced understand that email is not considered secure and their health information could be viewed by someone other than the patient.   The patient was advised to call back or seek an in-person evaluation if the symptoms worsen or if the condition fails to improve as anticipated.  Time:  I spent 12 minutes with the patient via telehealth technology discussing the above problems/concerns.    Karla Loveless, PA-C

## 2021-12-28 DIAGNOSIS — H8102 Meniere's disease, left ear: Secondary | ICD-10-CM | POA: Diagnosis not present

## 2021-12-28 DIAGNOSIS — J310 Chronic rhinitis: Secondary | ICD-10-CM | POA: Diagnosis not present

## 2021-12-28 DIAGNOSIS — H9042 Sensorineural hearing loss, unilateral, left ear, with unrestricted hearing on the contralateral side: Secondary | ICD-10-CM | POA: Diagnosis not present

## 2021-12-28 DIAGNOSIS — R42 Dizziness and giddiness: Secondary | ICD-10-CM | POA: Diagnosis not present

## 2021-12-28 DIAGNOSIS — J343 Hypertrophy of nasal turbinates: Secondary | ICD-10-CM | POA: Diagnosis not present

## 2022-01-03 ENCOUNTER — Telehealth: Payer: Self-pay | Admitting: Internal Medicine

## 2022-01-03 NOTE — Telephone Encounter (Signed)
Patient was sent in a prescription for Sucralfate and she wants to have the liquid instead of the pills.  Please resend for the liquid prescription  ?

## 2022-01-05 ENCOUNTER — Other Ambulatory Visit: Payer: Self-pay | Admitting: Internal Medicine

## 2022-01-05 ENCOUNTER — Telehealth: Payer: Self-pay | Admitting: Internal Medicine

## 2022-01-05 MED ORDER — SUCRALFATE 1 GM/10ML PO SUSP: 1.0000 g | Freq: Three times a day (TID) | ORAL | 2 refills | Status: DC

## 2022-01-05 NOTE — Telephone Encounter (Signed)
Liquid carafate sent to pharmacy  ?

## 2022-01-06 NOTE — Telephone Encounter (Signed)
Noted  

## 2022-02-24 ENCOUNTER — Other Ambulatory Visit: Payer: Self-pay | Admitting: Internal Medicine

## 2022-02-24 NOTE — Telephone Encounter (Signed)
Pt wants refill for her Omeprazole 20 mg caps. Her last ov here was 12/22/21 ?

## 2022-02-25 ENCOUNTER — Ambulatory Visit (INDEPENDENT_AMBULATORY_CARE_PROVIDER_SITE_OTHER): Payer: BC Managed Care – PPO

## 2022-02-25 DIAGNOSIS — Z3042 Encounter for surveillance of injectable contraceptive: Secondary | ICD-10-CM

## 2022-02-25 MED ORDER — MEDROXYPROGESTERONE ACETATE 150 MG/ML IM SUSP
150.0000 mg | Freq: Once | INTRAMUSCULAR | Status: AC
  Administered 2022-02-25: 150 mg via INTRAMUSCULAR

## 2022-02-25 NOTE — Progress Notes (Signed)
? ?  NURSE VISIT- INJECTION ? ?SUBJECTIVE:  ?Karla Ward is a 51 y.o. [phone] female here for a Depo Provera for contraception/period management. She is a GYN patient.  ? ?OBJECTIVE:  ?There were no vitals taken for this visit.  ?Appears well, in no apparent distress ? ?Injection administered in: Right deltoid ? ?Meds ordered this encounter  ?Medications  ? medroxyPROGESTERone (DEPO-PROVERA) injection 150 mg  ? ? ?ASSESSMENT: ?GYN patient Depo Provera for contraception/period management ?PLAN: ?Follow-up: in 11-13 weeks for next Depo  ? ?Elin Seats A Latricia Cerrito  ?02/25/2022 ?10:25 AM ? ?

## 2022-05-04 ENCOUNTER — Telehealth: Payer: Self-pay | Admitting: Internal Medicine

## 2022-05-04 NOTE — Telephone Encounter (Signed)
Pt returning call. [phone]

## 2022-05-05 NOTE — Telephone Encounter (Signed)
Returned the pt's call @ [phone] and LMOVM to return call

## 2022-05-06 NOTE — Telephone Encounter (Signed)
Returned the pt's call LMOVM for the pt to call

## 2022-05-09 DIAGNOSIS — M545 Low back pain, unspecified: Secondary | ICD-10-CM | POA: Diagnosis not present

## 2022-05-09 DIAGNOSIS — R42 Dizziness and giddiness: Secondary | ICD-10-CM | POA: Diagnosis not present

## 2022-05-09 DIAGNOSIS — E559 Vitamin D deficiency, unspecified: Secondary | ICD-10-CM | POA: Diagnosis not present

## 2022-05-09 DIAGNOSIS — I10 Essential (primary) hypertension: Secondary | ICD-10-CM | POA: Diagnosis not present

## 2022-05-10 ENCOUNTER — Ambulatory Visit (HOSPITAL_COMMUNITY)
Admission: RE | Admit: 2022-05-10 | Discharge: 2022-05-10 | Disposition: A | Discharge status: Home / Self Care | Payer: BC Managed Care – PPO | Source: Ambulatory Visit | Attending: Nurse Practitioner | Admitting: Nurse Practitioner

## 2022-05-10 ENCOUNTER — Other Ambulatory Visit (HOSPITAL_COMMUNITY): Payer: Self-pay | Admitting: Nurse Practitioner

## 2022-05-10 DIAGNOSIS — M545 Low back pain, unspecified: Secondary | ICD-10-CM

## 2022-05-10 NOTE — Progress Notes (Unsigned)
Cardiology Office Note:   Date:  05/12/2022  NAME:  Karla Ward    MRN: [id number] DOB:  [birth year]   PCP:  Gareth Morgan, MD  Cardiologist:  None  Electrophysiologist:  None   Referring MD: Gareth Morgan, MD   Chief Complaint  Patient presents with   Chest Pain   History of Present Illness:   Karla Ward is a 51 y.o. female with a hx of HTN who presents for follow-up. Seen in October for CP. CCTA was ordered but not completed.  She reports she is continue to have chest pain symptoms.  Describes achiness in her chest.  Appears to feel like heartburn.  Improved with Prilosec as well as acid reducing agents.  She had some when driving in [place].  She reports symptoms lasted for days.  They improved with water as well as heartburn medication.  She also reports intermittent palpitations.  Can occur once per week.  Last seconds.  They appear benign.  She overall is without limitations.  She has no exertional chest pain or shortness of breath.  Her blood pressure is well controlled.  She is without major complaints.  She is working with her GI doctor.  They have increased her acid reducing agents.  To me this sounds like heartburn.  We did offer her coronary CTA but she reports she would like to forego this at this time.  This is quite reasonable.  Past Medical History: Past Medical History:  Diagnosis Date   Anemia    Dizziness    Gastritis    GERD (gastroesophageal reflux disease)    Hypertension    Iron deficiency    Meniere disease    Status post dilation of esophageal narrowing    Vertigo    Vitamin D deficiency     Past Surgical History: Past Surgical History:  Procedure Laterality Date   DILITATION & CURRETTAGE/HYSTROSCOPY WITH NOVASURE ABLATION N/A 04/05/2017   Procedure: DILATATION & CURETTAGE/HYSTEROSCOPY WITH NOVASURE ENDOMETRIAL ABLATION;  Surgeon: Lazaro Arms, MD;  Location: [facility] ORS;  Service: Gynecology;  Laterality: N/A;    ESOPHAGOGASTRODUODENOSCOPY N/A 09/02/2015   VOH:YWVP non-erosive gastritis/stricture at the gastroesophageal junction    NONE TO DATE  08/13/15   SAVORY DILATION  09/02/2015   Procedure: SAVORY DILATION;  Surgeon: West Bali, MD;  Location: [facility] END[facility] SUITE;  Service: Endoscopy;;    Current Medications: Current Meds  Medication Sig   Albuterol Sulfate, sensor, (PROAIR DIGIHALER) 108 (90 Base) MCG/ACT AEPB Inhale into the lungs as needed.   amLODipine (NORVASC) 5 MG tablet Take 5 mg by mouth daily.   cetirizine (ZYRTEC) 10 MG tablet Take 10 mg by mouth daily.   doxycycline (VIBRA-TABS) 100 MG tablet Take 1 tablet (100 mg total) by mouth 2 (two) times daily.   fluticasone (FLONASE) 50 MCG/ACT nasal spray Place 2 sprays into both nostrils daily.   meclizine (ANTIVERT) 25 MG tablet Take 50 mg by mouth 2 (two) times daily as needed.   omeprazole (PRILOSEC) 20 MG capsule TAKE 1 CAPSULE (20 MG TOTAL) BY MOUTH 2 (TWO) TIMES DAILY BEFORE A MEAL.   ondansetron (ZOFRAN ODT) 4 MG disintegrating tablet Take 1 tablet (4 mg total) by mouth every 8 (eight) hours as needed for nausea or vomiting.   predniSONE (STERAPRED UNI-PAK 21 TAB) 10 MG (21) TBPK tablet 6 day taper; take as directed on package instructions   PSEUDOEPHEDRINE-ACETAMINOPHEN PO Take 60 mg by mouth.   VITAMIN D PO Take by mouth daily.  Allergies:    Benadryl [diphenhydramine hcl (sleep)], Ciprofloxacin, Ibuprofen, Penicillins, and Vitamin d analogs   Social History: Social History   Socioeconomic History   Marital status: Married    Spouse name: Not on file   Number of children: 2   Years of education: Not on file   Highest education level: Not on file  Occupational History   Not on file  Tobacco Use   Smoking status: Never   Smokeless tobacco: Never  Vaping Use   Vaping Use: Never used  Substance and Sexual Activity   Alcohol use: No   Drug use: No   Sexual activity: Yes    Birth control/protection: Surgical,  Injection    Comment: ablation  Other Topics Concern   Not on file  Social History Narrative   Lives with husband   Right handed   Caffeine- some    Social Determinants of Health   Financial Resource Strain: Low Risk  (06/21/2021)   Overall Financial Resource Strain (CARDIA)    Difficulty of Paying Living Expenses: Not hard at all  Food Insecurity: No Food Insecurity (06/21/2021)   Hunger Vital Sign    Worried About Running Out of Food in the Last Year: Never true    Ran Out of Food in the Last Year: Never true  Transportation Needs: No Transportation Needs (06/21/2021)   PRAPARE - Transportation    Lack of Transportation (Medical): No    Lack of Transportation (Non-Medical): No  Physical Activity: Insufficiently Active (06/21/2021)   Exercise Vital Sign    Days of Exercise per Week: 3 days    Minutes of Exercise per Session: 30 min  Stress: No Stress Concern Present (06/21/2021)   [employer] of Occupational Health - Occupational Stress Questionnaire    Feeling of Stress : Only a little  Social Connections: Socially Integrated (06/21/2021)   Social Connection and Isolation Panel [NHANES]    Frequency of Communication with Friends and Family: More than three times a week    Frequency of Social Gatherings with Friends and Family: Twice a week    Attends Religious Services: More than 4 times per year    Active Member of [employer] or Organizations: Yes    Attends Banker Meetings: 1 to 4 times per year    Marital Status: Married     Family History: The patient's family history includes Congestive Heart Failure in her sister; Hypertension in her father and mother; Throat cancer (age of onset: 22) in her father. There is no history of Colon cancer or Colon polyps.  ROS:   All other ROS reviewed and negative. Pertinent positives noted in the HPI.     EKGs/Labs/Other Studies Reviewed:   The following studies were personally reviewed by me today:  Recent  Labs: 08/30/2021: BUN 8; Creatinine, Ser 1.01; Potassium 4.5; Sodium 144   Recent Lipid Panel No results found for: "CHOL", "TRIG", "HDL", "CHOLHDL", "VLDL", "LDLCALC", "LDLDIRECT"  Physical Exam:   VS:  BP 124/88   Pulse 78   Ht 5\' 4"  (1.626 m)   Wt 204 lb 3.2 oz (92.6 kg)   SpO2 98%   BMI 35.05 kg/m    Wt Readings from Last 3 Encounters:  05/12/22 204 lb 3.2 oz (92.6 kg)  12/22/21 199 lb (90.3 kg)  11/02/21 199 lb (90.3 kg)    General: Well nourished, well developed, in no acute distress Head: Atraumatic, normal size  Eyes: PEERLA, EOMI  Neck: Supple, no JVD Endocrine: No thryomegaly Cardiac: Normal  S1, S2; RRR; no murmurs, rubs, or gallops Lungs: Clear to auscultation bilaterally, no wheezing, rhonchi or rales  Abd: Soft, nontender, no hepatomegaly  Ext: No edema, pulses 2+ Musculoskeletal: No deformities, BUE and BLE strength normal and equal Skin: Warm and dry, no rashes   Neuro: Alert and oriented to person, place, time, and situation, CNII-XII grossly intact, no focal deficits  Psych: Normal mood and affect   ASSESSMENT:   Essa Wenk is a 51 y.o. female who presents for the following: 1. Precordial pain   2. Palpitations     PLAN:   1. Precordial pain -Symptoms are more consistent with acid reflux.  They are improved by water as well as acid reducing agents.  She also has a history of esophageal stricture.  Symptoms are not exertional.  Her EKG is normal.  Her exam is normal.  I think it is okay to forego testing.  I see her is low risk.  She will continue to work with her GI doctor.  2. Palpitations -Describes intermittent palpitations.  Can last seconds.  Occurring once per week.  Really are not bothersome.  Nothing appears to be lasting minutes or hours.  We will just continue to monitor this.  Disposition: Return if symptoms worsen or fail to improve.  Medication Adjustments/Labs and Tests Ordered: Current medicines are reviewed at length with  the patient today.  Concerns regarding medicines are outlined above.  No orders of the defined types were placed in this encounter.  No orders of the defined types were placed in this encounter.   Patient Instructions  Medication Instructions:  The current medical regimen is effective;  continue present plan and medications.  *If you need a refill on your cardiac medications before your next appointment, please call your pharmacy*   Follow-Up: At [facility], you and your health needs are our priority.  As part of our continuing mission to provide you with exceptional heart care, we have created designated Provider Care Teams.  These Care Teams include your primary Cardiologist (physician) and Advanced Practice Providers (APPs -  Physician Assistants and Nurse Practitioners) who all work together to provide you with the care you need, when you need it.  We recommend signing up for the patient portal called "MyChart".  Sign up information is provided on this After Visit Summary.  MyChart is used to connect with patients for Virtual Visits (Telemedicine).  Patients are able to view lab/test results, encounter notes, upcoming appointments, etc.  Non-urgent messages can be sent to your provider as well.   To learn more about what you can do with MyChart, go to [email or web address].    Your next appointment:   As needed  The format for your next appointment:   In Person  Provider:   Lennie Odor, MD            Time Spent with Patient: I have spent a total of 25 minutes with patient reviewing hospital notes, telemetry, EKGs, labs and examining the patient as well as establishing an assessment and plan that was discussed with the patient.  > 50% of time was spent in direct patient care.  Signed, Lenna Gilford. Flora Lipps, MD, [facility]  [facility]  [address] [place], [place] [address] [phone]  05/12/2022 9:12 AM

## 2022-05-11 DIAGNOSIS — E559 Vitamin D deficiency, unspecified: Secondary | ICD-10-CM | POA: Diagnosis not present

## 2022-05-12 ENCOUNTER — Encounter: Payer: Self-pay | Admitting: Cardiovascular Disease

## 2022-05-12 ENCOUNTER — Ambulatory Visit: Payer: BC Managed Care – PPO | Admitting: Cardiovascular Disease

## 2022-05-12 VITALS — BP 124/88 | HR 78 | Ht 64.0 in | Wt 204.2 lb

## 2022-05-12 DIAGNOSIS — R002 Palpitations: Secondary | ICD-10-CM | POA: Diagnosis not present

## 2022-05-12 DIAGNOSIS — R072 Precordial pain: Secondary | ICD-10-CM | POA: Diagnosis not present

## 2022-05-12 NOTE — Patient Instructions (Signed)
Medication Instructions:  The current medical regimen is effective;  continue present plan and medications.  *If you need a refill on your cardiac medications before your next appointment, please call your pharmacy*    Follow-Up: At [facility], you and your health needs are our priority.  As part of our continuing mission to provide you with exceptional heart care, we have created designated Provider Care Teams.  These Care Teams include your primary Cardiologist (physician) and Advanced Practice Providers (APPs -  Physician Assistants and Nurse Practitioners) who all work together to provide you with the care you need, when you need it.  We recommend signing up for the patient portal called "MyChart".  Sign up information is provided on this After Visit Summary.  MyChart is used to connect with patients for Virtual Visits (Telemedicine).  Patients are able to view lab/test results, encounter notes, upcoming appointments, etc.  Non-urgent messages can be sent to your provider as well.   To learn more about what you can do with MyChart, go to [email or web address].    Your next appointment:   As needed  The format for your next appointment:   In Person  Provider:   St. Clair O'Neal, MD      

## 2022-05-12 NOTE — Telephone Encounter (Signed)
Letter mailed to the pt. 

## 2022-05-13 DIAGNOSIS — E559 Vitamin D deficiency, unspecified: Secondary | ICD-10-CM | POA: Diagnosis not present

## 2022-05-13 DIAGNOSIS — K5900 Constipation, unspecified: Secondary | ICD-10-CM | POA: Diagnosis not present

## 2022-05-13 DIAGNOSIS — M5432 Sciatica, left side: Secondary | ICD-10-CM | POA: Diagnosis not present

## 2022-05-16 ENCOUNTER — Telehealth: Payer: Self-pay | Admitting: Internal Medicine

## 2022-05-16 ENCOUNTER — Other Ambulatory Visit: Payer: Self-pay | Admitting: Adult Health

## 2022-05-16 NOTE — Telephone Encounter (Signed)
PATIENT HAS NOT GONE TO THE BATHROOM SINCE FRIDAY AND WANTS TO KNOW WHAT SHE AN TAKE   PLEASE CALL

## 2022-05-16 NOTE — Telephone Encounter (Signed)
Spoke with the pt and was advised of 2 different issues she is having. The first is constipation. Pt had a  x-ray done of her back and it showed stool in her colon. Pt would like to know what can she take to use the bathroom. She states it feels like she is not emptying her bowels completely after she has a BM. States it bothering her more every day and the second issue: her heartburn is worse. She is taking her Omeprazole and Carafate but she is continuously is bothered with it. She is eating more fruits and vegetables for her BM's but its creating an issue with her heartburn. Pt is asking what can she do for that. She also advises that the liquid Carafate helps better than the capsules. Please advise

## 2022-05-16 NOTE — Telephone Encounter (Signed)
Returned the pt's call and LMOVM for the pt to call.

## 2022-05-17 ENCOUNTER — Other Ambulatory Visit: Payer: Self-pay | Admitting: Internal Medicine

## 2022-05-17 MED ORDER — SUCRALFATE 1 GM/10ML PO SUSP: 1.0000 g | Freq: Three times a day (TID) | ORAL | 2 refills | Status: DC

## 2022-05-17 MED ORDER — PANTOPRAZOLE SODIUM 40 MG PO TBEC: 40.0000 mg | DELAYED_RELEASE_TABLET | Freq: Two times a day (BID) | ORAL | 5 refills | Status: DC

## 2022-05-17 NOTE — Telephone Encounter (Signed)
Please schedule pt appt with Dr Marletta Lor or either a APP.   Phoned and spoke with the pt and advised of the recommendations for her constipation. Pt has tried MiraLAX but not with a Dulcolax added. She will try that for a week and call us back if it is not effective so she can pick up samples of Linzess 145 mcg.  In regards to her reflux advised of the change with Rx and to continue her liquid carafate as needed (was refilled). Pt does agree to appt to be seen.

## 2022-05-17 NOTE — Telephone Encounter (Signed)
Has she taken anything for her constipation?  She has not tried over-the-counter's, recommend she start taking MiraLAX 1-2 capfuls daily.  If this is not adequate she can add on once a day Dulcolax.  Be sure to drink at least 4 to 6 glasses of water daily.  If she has tried over-the-counter medications already without improvement in her symptoms, then please provide samples of Linzess 145 mcg daily.  In regards to her reflux, I will switch her to pantoprazole 40 mg daily.  Continue liquid Carafate as needed (I will refill).  Please schedule her for office visit as well with either an app or myself.  Thank you

## 2022-05-20 ENCOUNTER — Ambulatory Visit (INDEPENDENT_AMBULATORY_CARE_PROVIDER_SITE_OTHER): Payer: BC Managed Care – PPO | Admitting: *Deleted

## 2022-05-20 DIAGNOSIS — Z3042 Encounter for surveillance of injectable contraceptive: Secondary | ICD-10-CM | POA: Diagnosis not present

## 2022-05-20 MED ORDER — MEDROXYPROGESTERONE ACETATE 150 MG/ML IM SUSP
150.0000 mg | Freq: Once | INTRAMUSCULAR | Status: AC
  Administered 2022-05-20: 150 mg via INTRAMUSCULAR

## 2022-05-20 NOTE — Progress Notes (Signed)
   NURSE VISIT- INJECTION  SUBJECTIVE:  Karla Ward is a 51 y.o. [phone] female here for a Depo Provera for contraception/period management. She is a GYN patient.   OBJECTIVE:  There were no vitals taken for this visit.  Appears well, in no apparent distress  Injection administered in: Left deltoid  Meds ordered this encounter  Medications   medroxyPROGESTERone (DEPO-PROVERA) injection 150 mg    ASSESSMENT: GYN patient Depo Provera for contraception/period management PLAN: Follow-up: in 11-13 weeks for next Depo; schedule pap   Malachy Mood  05/20/2022 10:03 AM

## 2022-06-13 DIAGNOSIS — D509 Iron deficiency anemia, unspecified: Secondary | ICD-10-CM | POA: Diagnosis not present

## 2022-06-13 DIAGNOSIS — R350 Frequency of micturition: Secondary | ICD-10-CM | POA: Diagnosis not present

## 2022-06-13 DIAGNOSIS — E039 Hypothyroidism, unspecified: Secondary | ICD-10-CM | POA: Diagnosis not present

## 2022-06-13 DIAGNOSIS — K5900 Constipation, unspecified: Secondary | ICD-10-CM | POA: Diagnosis not present

## 2022-06-13 DIAGNOSIS — R1030 Lower abdominal pain, unspecified: Secondary | ICD-10-CM | POA: Diagnosis not present

## 2022-06-13 DIAGNOSIS — M545 Low back pain, unspecified: Secondary | ICD-10-CM | POA: Diagnosis not present

## 2022-06-13 DIAGNOSIS — D559 Anemia due to enzyme disorder, unspecified: Secondary | ICD-10-CM | POA: Diagnosis not present

## 2022-06-13 DIAGNOSIS — E559 Vitamin D deficiency, unspecified: Secondary | ICD-10-CM | POA: Diagnosis not present

## 2022-06-21 DIAGNOSIS — J310 Chronic rhinitis: Secondary | ICD-10-CM | POA: Diagnosis not present

## 2022-06-21 DIAGNOSIS — R42 Dizziness and giddiness: Secondary | ICD-10-CM | POA: Diagnosis not present

## 2022-06-21 DIAGNOSIS — J343 Hypertrophy of nasal turbinates: Secondary | ICD-10-CM | POA: Diagnosis not present

## 2022-06-21 DIAGNOSIS — H8102 Meniere's disease, left ear: Secondary | ICD-10-CM | POA: Diagnosis not present

## 2022-06-21 DIAGNOSIS — H9042 Sensorineural hearing loss, unilateral, left ear, with unrestricted hearing on the contralateral side: Secondary | ICD-10-CM | POA: Diagnosis not present

## 2022-06-22 ENCOUNTER — Encounter: Payer: Self-pay | Admitting: Adult Health

## 2022-06-22 ENCOUNTER — Telehealth: Payer: Self-pay | Admitting: *Deleted

## 2022-06-22 ENCOUNTER — Ambulatory Visit: Payer: BC Managed Care – PPO | Admitting: Adult Health

## 2022-06-22 VITALS — BP 128/75 | HR 78 | Ht 64.0 in | Wt 203.0 lb

## 2022-06-22 DIAGNOSIS — R319 Hematuria, unspecified: Secondary | ICD-10-CM

## 2022-06-22 DIAGNOSIS — R350 Frequency of micturition: Secondary | ICD-10-CM | POA: Diagnosis not present

## 2022-06-22 DIAGNOSIS — R102 Pelvic and perineal pain: Secondary | ICD-10-CM | POA: Diagnosis not present

## 2022-06-22 DIAGNOSIS — N390 Urinary tract infection, site not specified: Secondary | ICD-10-CM

## 2022-06-22 DIAGNOSIS — M545 Low back pain, unspecified: Secondary | ICD-10-CM | POA: Diagnosis not present

## 2022-06-22 LAB — POCT URINALYSIS DIPSTICK
Glucose, UA: NEGATIVE
Ketones, UA: NEGATIVE
Leukocytes, UA: NEGATIVE
Nitrite, UA: NEGATIVE
Protein, UA: POSITIVE — AB

## 2022-06-22 MED ORDER — SULFAMETHOXAZOLE-TRIMETHOPRIM 800-160 MG PO TABS: 1.0000 | ORAL_TABLET | Freq: Two times a day (BID) | ORAL | 0 refills | Status: DC

## 2022-06-22 NOTE — Progress Notes (Signed)
  Subjective:     Patient ID: Karla Ward, female   DOB: Feb 20, 1971, 51 y.o.   MRN: [id number]  HPI [name] is a 51 year old black female,married, [id number], in complaining of pelvic pain for 3 days, and urinary frequency and low back pain. No burning.  Pap 01/20/20 repeat 2024 High risk HPV Negative   Neisseria Gonorrhea Negative   Chlamydia Negative   Adequacy Satisfactory for evaluation; transformation zone component PRESENT.   Diagnosis - Atypical squamous cells of undetermined significance (ASC-US) Abnormal    PCP is Dr Sudie Bailey  Review of Systems See HPI for positives Denies any burning   Reviewed past medical,surgical, social and family history. Reviewed medications and allergies.  Objective:   Physical Exam BP 128/75 (BP Location: Left Arm, Patient Position: Sitting, Cuff Size: Normal)   Pulse 78   Ht 5\' 4"  (1.626 m)   Wt 203 lb (92.1 kg)   BMI 34.84 kg/m  urine dipstick 3+blood and trace protein. Skin warm and dry.Pelvic: external genitalia is normal in appearance no lesions, vagina: pink,urethra has no lesions or masses noted, cervix:smooth and bulbous, uterus: normal size, shape and contour, non tender, no masses felt, adnexa: no masses or tenderness noted. Bladder is non tender and no masses felt. NO CVAT noted    Fall risk is low  Upstream - 06/22/22 1150       Pregnancy Intention Screening   Does the patient want to become pregnant in the next year? No    Does the patient's partner want to become pregnant in the next year? No    Would the patient like to discuss contraceptive options today? No      Contraception Wrap Up   Current Method Hormonal Injection   ablation   End Method Hormonal Injection   ablation           Examination chaperoned by 06/24/22 LPN  Assessment:     1. Urinary frequency Ha shad frequency for 3 days  No burning  UA C&S sent to rule out UTI - POCT Urinalysis Dipstick - Urine Culture - Urinalysis, Routine w reflex  microscopic  2. Pelvic pain Has pain for 3 days  3. Urinary tract infection with hematuria, site unspecified Will rx septra ds Meds ordered this encounter  Medications   sulfamethoxazole-trimethoprim (BACTRIM DS) 800-160 MG tablet    Sig: Take 1 tablet by mouth 2 (two) times daily. Take 1 bid    Dispense:  14 tablet    Refill:  0    Order Specific Question:   Supervising Provider    Answer:   Malachy Mood, LUTHER H [[id number]]     4. Acute bilateral low back pain without sciatica    Plan:     Return as scheduled 07/06/22 for physical

## 2022-06-22 NOTE — Telephone Encounter (Signed)
Pt is requesting  a different antibiotic due to all the side effects that could happen. Pt states the pharmacy was telling her the side effects so she didn't pick up med. Pt will wait and see what the culture shows. In the mean time, she will push fluids. JSY

## 2022-06-23 ENCOUNTER — Other Ambulatory Visit: Payer: Self-pay | Admitting: Advanced Practice Midwife

## 2022-06-23 LAB — URINALYSIS, ROUTINE W REFLEX MICROSCOPIC
Bilirubin, UA: NEGATIVE
Glucose, UA: NEGATIVE
Nitrite, UA: NEGATIVE
Specific Gravity, UA: >=1.03 — AB (ref 1.005–1.030)
Urobilinogen, Ur: 1 mg/dL (ref 0.2–1.0)
pH, UA: 5.5 (ref 5.0–7.5)

## 2022-06-23 LAB — MICROSCOPIC EXAMINATION
Casts: NONE SEEN /lpf
Epithelial Cells (non renal): >10 /hpf — AB (ref 0–10)

## 2022-06-23 MED ORDER — NITROFURANTOIN MONOHYD MACRO 100 MG PO CAPS: 100.0000 mg | ORAL_CAPSULE | Freq: Two times a day (BID) | ORAL | 0 refills | Status: DC

## 2022-06-23 MED ORDER — PHENAZOPYRIDINE HCL 200 MG PO TABS: 200.0000 mg | ORAL_TABLET | Freq: Three times a day (TID) | ORAL | 0 refills | Status: DC | PRN

## 2022-06-24 ENCOUNTER — Ambulatory Visit (HOSPITAL_COMMUNITY): Payer: BC Managed Care – PPO

## 2022-06-24 LAB — URINE CULTURE

## 2022-06-27 ENCOUNTER — Telehealth: Payer: Self-pay | Admitting: *Deleted

## 2022-06-27 ENCOUNTER — Telehealth: Payer: Self-pay

## 2022-06-27 NOTE — Telephone Encounter (Signed)
Patient would like for someone to call her about her test results 

## 2022-06-27 NOTE — Telephone Encounter (Signed)
Pt aware urine culture showed no dominant growth. Pt wonders if she needs to continue taking Macrobid. She will finish it Wednesday. Also, her pelvic pain continues when she bends over and in her low back. Please advise. Thanks!! JSY

## 2022-06-27 NOTE — Telephone Encounter (Signed)
Pt was advised to finish antibiotic and if she still has pain after finishing med, will need to follow up per JAG. Pt voiced understanding. JSY

## 2022-06-27 NOTE — Telephone Encounter (Signed)
Results and plan discussed with pt. JSY

## 2022-06-29 DIAGNOSIS — R1030 Lower abdominal pain, unspecified: Secondary | ICD-10-CM | POA: Diagnosis not present

## 2022-06-29 DIAGNOSIS — R350 Frequency of micturition: Secondary | ICD-10-CM | POA: Diagnosis not present

## 2022-06-29 DIAGNOSIS — R809 Proteinuria, unspecified: Secondary | ICD-10-CM | POA: Diagnosis not present

## 2022-07-01 DIAGNOSIS — E559 Vitamin D deficiency, unspecified: Secondary | ICD-10-CM | POA: Diagnosis not present

## 2022-07-01 DIAGNOSIS — R799 Abnormal finding of blood chemistry, unspecified: Secondary | ICD-10-CM | POA: Diagnosis not present

## 2022-07-01 DIAGNOSIS — D509 Iron deficiency anemia, unspecified: Secondary | ICD-10-CM | POA: Diagnosis not present

## 2022-07-01 DIAGNOSIS — D559 Anemia due to enzyme disorder, unspecified: Secondary | ICD-10-CM | POA: Diagnosis not present

## 2022-07-01 DIAGNOSIS — I10 Essential (primary) hypertension: Secondary | ICD-10-CM | POA: Diagnosis not present

## 2022-07-01 DIAGNOSIS — Z79899 Other long term (current) drug therapy: Secondary | ICD-10-CM | POA: Diagnosis not present

## 2022-07-01 DIAGNOSIS — E039 Hypothyroidism, unspecified: Secondary | ICD-10-CM | POA: Diagnosis not present

## 2022-07-01 DIAGNOSIS — E7800 Pure hypercholesterolemia, unspecified: Secondary | ICD-10-CM | POA: Diagnosis not present

## 2022-07-05 ENCOUNTER — Ambulatory Visit (INDEPENDENT_AMBULATORY_CARE_PROVIDER_SITE_OTHER): Payer: BC Managed Care – PPO | Admitting: Gastroenterology

## 2022-07-05 ENCOUNTER — Encounter: Payer: Self-pay | Admitting: Gastroenterology

## 2022-07-05 VITALS — BP 132/82 | HR 82 | Temp 97.7°F | Ht 63.0 in | Wt 203.4 lb

## 2022-07-05 DIAGNOSIS — K219 Gastro-esophageal reflux disease without esophagitis: Secondary | ICD-10-CM | POA: Diagnosis not present

## 2022-07-05 DIAGNOSIS — K5900 Constipation, unspecified: Secondary | ICD-10-CM | POA: Insufficient documentation

## 2022-07-05 NOTE — Progress Notes (Signed)
Gastroenterology Office Note     Primary Care Physician:  Lemmie Evens, MD  Primary Gastroenterologist: Dr. Abbey Chatters    Chief Complaint   Chief Complaint  Patient presents with   Follow-up     History of Present Illness   Karla Ward is a 51 y.o. female presenting today in follow-up with a history of chronic GERD, stricture s/p dilation in 2016, intermittent constipation, no prior colonoscopy. Last seen in Feb 2023.   Lumbar xray July 2023 showed stool burden. She called in with constipation and was advised to take Miralax and dulcolax. She took colace for 2-3 days then felt bowels moving better. Started back on Benefiber, which has helped significantly. No overt GI bleeding.   GERD will flare with red sauces, chocolate, caffeinee. Rare sodas. No dysphagia. No epigastric pain. GERD flared over the summer when father-in-law passed. However, she is improved with dietary changes. No epigastric pain,N/V.   She is nervous to be put to sleep. She will be doing the Cologuard test through PCP. Wants to hold off on colonoscopy right now.   Past Medical History:  Diagnosis Date   Anemia    Dizziness    Gastritis    GERD (gastroesophageal reflux disease)    Hypertension    Iron deficiency    Meniere disease    Status post dilation of esophageal narrowing    Vertigo    Vitamin D deficiency     Past Surgical History:  Procedure Laterality Date   DILITATION & CURRETTAGE/HYSTROSCOPY WITH NOVASURE ABLATION N/A 04/05/2017   Procedure: DILATATION & CURETTAGE/HYSTEROSCOPY WITH NOVASURE ENDOMETRIAL ABLATION;  Surgeon: Florian Buff, MD;  Location: [facility];  Service: Gynecology;  Laterality: N/A;   ESOPHAGOGASTRODUODENOSCOPY N/A 09/02/2015   [id number] non-erosive gastritis/stricture at the gastroesophageal junction    NONE TO DATE  08/13/15   SAVORY DILATION  09/02/2015   Procedure: SAVORY DILATION;  Surgeon: Danie Binder, MD;  Location: [facility];  Service:  Endoscopy;;    Current Outpatient Medications  Medication Sig Dispense Refill   Albuterol Sulfate, sensor, (PROAIR DIGIHALER) 108 (90 Base) MCG/ACT AEPB Inhale into the lungs as needed.     amLODipine (NORVASC) 5 MG tablet Take 5 mg by mouth daily.     cetirizine (ZYRTEC) 10 MG tablet Take 10 mg by mouth daily.     meclizine (ANTIVERT) 25 MG tablet Take 50 mg by mouth 2 (two) times daily as needed.     medroxyPROGESTERone (DEPO-PROVERA) 150 MG/ML injection INJECT 1 ML (150 MG TOTAL) INTO THE MUSCLE EVERY 3 (THREE) MONTHS 1 mL 3   nitrofurantoin, macrocrystal-monohydrate, (MACROBID) 100 MG capsule Take 1 capsule (100 mg total) by mouth 2 (two) times daily. 14 capsule 0   omeprazole (PRILOSEC) 20 MG capsule Take 20 mg by mouth daily.     ondansetron (ZOFRAN ODT) 4 MG disintegrating tablet Take 1 tablet (4 mg total) by mouth every 8 (eight) hours as needed for nausea or vomiting. 15 tablet 0   phenazopyridine (PYRIDIUM) 200 MG tablet Take 1 tablet (200 mg total) by mouth 3 (three) times daily as needed for pain. 10 tablet 0   sucralfate (CARAFATE) 1 GM/10ML suspension Take 10 mLs (1 g total) by mouth 4 (four) times daily -  with meals and at bedtime. 1200 mL 11   VITAMIN D PO Take by mouth daily.     No current facility-administered medications for this visit.    Allergies as of 07/05/2022 - Review Complete  07/05/2022  Allergen Reaction Noted   Benadryl [diphenhydramine hcl (sleep)]  12/26/2014   Ciprofloxacin  04/29/2021   Ibuprofen  04/29/2021   Penicillins Hives 11/19/2012   Vitamin d analogs Other (See Comments) 04/29/2021    Family History  Problem Relation Age of Onset   Throat cancer Father 52   Hypertension Father    Hypertension Mother    Congestive Heart Failure Sister    Colon cancer Neg Hx    Colon polyps Neg Hx     Social History   Socioeconomic History   Marital status: Married    Spouse name: Not on file   Number of children: 2   Years of education: Not on  file   Highest education level: Not on file  Occupational History   Not on file  Tobacco Use   Smoking status: Never   Smokeless tobacco: Never  Vaping Use   Vaping Use: Never used  Substance and Sexual Activity   Alcohol use: No   Drug use: No   Sexual activity: Yes    Birth control/protection: Surgical, Injection    Comment: ablation  Other Topics Concern   Not on file  Social History Narrative   Lives with husband   Right handed   Caffeine- some    Social Determinants of Health   Financial Resource Strain: Low Risk  (06/21/2021)   Overall Financial Resource Strain (CARDIA)    Difficulty of Paying Living Expenses: Not hard at all  Food Insecurity: No Food Insecurity (06/21/2021)   Hunger Vital Sign    Worried About Running Out of Food in the Last Year: Never true    Ran Out of Food in the Last Year: Never true  Transportation Needs: No Transportation Needs (06/21/2021)   PRAPARE - Transportation    Lack of Transportation (Medical): No    Lack of Transportation (Non-Medical): No  Physical Activity: Insufficiently Active (06/21/2021)   Exercise Vital Sign    Days of Exercise per Week: 3 days    Minutes of Exercise per Session: 30 min  Stress: No Stress Concern Present (06/21/2021)   [employer] of Occupational Health - Occupational Stress Questionnaire    Feeling of Stress : Only a little  Social Connections: Socially Integrated (06/21/2021)   Social Connection and Isolation Panel [NHANES]    Frequency of Communication with Friends and Family: More than three times a week    Frequency of Social Gatherings with Friends and Family: Twice a week    Attends Religious Services: More than 4 times per year    Active Member of [employer] or Organizations: Yes    Attends Banker Meetings: 1 to 4 times per year    Marital Status: Married  Catering manager Violence: Not At Risk (06/21/2021)   Humiliation, Afraid, Rape, and Kick questionnaire    Fear of Current or  Ex-Partner: No    Emotionally Abused: No    Physically Abused: No    Sexually Abused: No     Review of Systems   Gen: Denies any fever, chills, fatigue, weight loss, lack of appetite.  CV: Denies chest pain, heart palpitations, peripheral edema, syncope.  Resp: Denies shortness of breath at rest or with exertion. Denies wheezing or cough.  GI: see HPI GU : Denies urinary burning, urinary frequency, urinary hesitancy MS: Denies joint pain, muscle weakness, cramps, or limitation of movement.  Derm: Denies rash, itching, dry skin Psych: Denies depression, anxiety, memory loss, and confusion Heme: Denies bruising, bleeding, and enlarged  lymph nodes.   Physical Exam   BP 132/82 (BP Location: Right Arm, Patient Position: Sitting, Cuff Size: Normal)   Pulse 82   Temp 97.7 F (36.5 C) (Temporal)   Ht 5\' 3"  (1.6 m)   Wt 203 lb 6.4 oz (92.3 kg)   SpO2 100%   BMI 36.03 kg/m  General:   Alert and oriented. Pleasant and cooperative. Well-nourished and well-developed.  Head:  Normocephalic and atraumatic. Eyes:  Without icterus Abdomen:  +BS, soft, non-tender and non-distended. No HSM noted. No guarding or rebound. No masses appreciated.  Rectal:  Deferred  Msk:  Symmetrical without gross deformities. Normal posture. Extremities:  Without edema. Neurologic:  Alert and  oriented x4;  grossly normal neurologically. Skin:  Intact without significant lesions or rashes. Psych:  Alert and cooperative. Normal mood and affect.   Assessment   Karla Ward is a 51 y.o. female presenting today in follow-up with a history of chronic GERD, stricture s/p dilation in 2016, intermittent constipation, no prior colonoscopy. Last seen in Feb 2023.   GERD: improved with avoidance of trigger foods. No epigastric pain, N/V, or dysphagia. She is improved from last visit. Taking omeprazole once daily.  Constipation: improved with taking Benefiber 2 teaspoons daily. No overt GI bleeding.  Need  for screening colonoscopy: she has elected to pursue Cologuard testing.    PLAN    Return in 6 months Continue Benefiber Continue omeprazole once daily Cologuard through PCP   Mar 2023, PhD, ANP-BC [facility]

## 2022-07-05 NOTE — Patient Instructions (Signed)
Continue omeprazole once daily.  You can continue Benefiber and take up to 3 times a day if needed!  We will see you in 6 months!  It was a pleasure to see you today. I want to create trusting relationships with patients to provide genuine, compassionate, and quality care. I value your feedback. If you receive a survey regarding your visit,  I greatly appreciate you taking time to fill this out.   Gelene Mink, PhD, ANP-BC [facility]

## 2022-07-06 ENCOUNTER — Encounter: Payer: Self-pay | Admitting: Adult Health

## 2022-07-06 ENCOUNTER — Ambulatory Visit (INDEPENDENT_AMBULATORY_CARE_PROVIDER_SITE_OTHER): Payer: BC Managed Care – PPO | Admitting: Adult Health

## 2022-07-06 ENCOUNTER — Other Ambulatory Visit (HOSPITAL_COMMUNITY)
Admission: RE | Admit: 2022-07-06 | Discharge: 2022-07-06 | Disposition: A | Discharge status: Home / Self Care | Payer: BC Managed Care – PPO | Source: Ambulatory Visit | Attending: Adult Health | Admitting: Adult Health

## 2022-07-06 VITALS — BP 126/79 | HR 82 | Ht 63.0 in | Wt 203.0 lb

## 2022-07-06 DIAGNOSIS — Z01419 Encounter for gynecological examination (general) (routine) without abnormal findings: Secondary | ICD-10-CM | POA: Insufficient documentation

## 2022-07-06 DIAGNOSIS — Z87440 Personal history of urinary (tract) infections: Secondary | ICD-10-CM | POA: Diagnosis not present

## 2022-07-06 DIAGNOSIS — Z1211 Encounter for screening for malignant neoplasm of colon: Secondary | ICD-10-CM

## 2022-07-06 DIAGNOSIS — Z3042 Encounter for surveillance of injectable contraceptive: Secondary | ICD-10-CM | POA: Diagnosis not present

## 2022-07-06 DIAGNOSIS — R3129 Other microscopic hematuria: Secondary | ICD-10-CM

## 2022-07-06 LAB — HEMOCCULT GUIAC POC 1CARD (OFFICE): Fecal Occult Blood, POC: NEGATIVE

## 2022-07-06 LAB — POCT URINALYSIS DIPSTICK
Glucose, UA: NEGATIVE
Ketones, UA: NEGATIVE
Nitrite, UA: NEGATIVE
Protein, UA: POSITIVE — AB

## 2022-07-06 NOTE — Progress Notes (Signed)
Patient ID: Karla Ward, female   DOB: [birth year], 51 y.o.   MRN: [id number] History of Present Illness: Karla Ward is a 51 year old black female,married, G2P0202, in for a well woman gyn exam and pap. She is feeling better,can bend over now, took Macrobid. PCP is Dr Sudie Bailey    Current Medications, Allergies, Past Medical History, Past Surgical History, Family History and Social History were reviewed in [employer] electronic medical record.     Review of Systems: Patient denies any headaches, hearing loss, fatigue, blurred vision, shortness of breath, chest pain, abdominal pain, problems with bowel movements, urination, or intercourse. No joint pain or mood swings.  She is happy with depo.   Physical Exam:BP 126/79 (BP Location: Left Arm, Patient Position: Sitting, Cuff Size: Normal)   Pulse 82   Ht 5\' 3"  (1.6 m)   Wt 203 lb (92.1 kg)   BMI 35.96 kg/m  urine dipstick trace leuks,mod. Blood and +protein. General:  Well developed, well nourished, no acute distress Skin:  Warm and dry Neck:  Midline trachea, normal thyroid, good ROM, no lymphadenopathy Lungs; Clear to auscultation bilaterally Breast:  No dominant palpable mass, retraction, or nipple discharge Cardiovascular: Regular rate and rhythm Abdomen:  Soft, non tender, no hepatosplenomegaly Pelvic:  External genitalia is normal in appearance, no lesions.  The vagina is normal in appearance. Urethra has no lesions or masses. The cervix is bulbous.Pap with HR HPV genotyping and GC/CHL performed.  Uterus is felt to be normal size, shape, and contour.  No adnexal masses or tenderness noted.Bladder is non tender, no masses felt. Rectal: Good sphincter tone, no polyps, or hemorrhoids felt.  Hemoccult negative. Extremities/musculoskeletal:  No swelling or varicosities noted, no clubbing or cyanosis Psych:  No mood changes, alert and cooperative,seems happy AA is 0 Fall risk is lo    07/06/2022    8:41 AM 06/21/2021    8:46 AM  01/20/2020    9:01 AM  Depression screen PHQ 2/9  Decreased Interest 0 0 0  Down, Depressed, Hopeless 0 0 0  PHQ - 2 Score 0 0 0  Altered sleeping 2 0   Tired, decreased energy 2 0   Change in appetite 0 0   Feeling bad or failure about yourself  0 0   Trouble concentrating 0 0   Moving slowly or fidgety/restless 0 0   Suicidal thoughts 0 0   PHQ-9 Score 4 0        07/06/2022    8:41 AM 06/21/2021    8:47 AM  GAD 7 : Generalized Anxiety Score  Nervous, Anxious, on Edge 0 0  Control/stop worrying 0 0  Worry too much - different things 2 0  Trouble relaxing 0 0  Restless 0 0  Easily annoyed or irritable 0 0  Afraid - awful might happen 0 0  Total GAD 7 Score 2 0      Upstream - 07/06/22 0840       Pregnancy Intention Screening   Does the patient want to become pregnant in the next year? No    Does the patient's partner want to become pregnant in the next year? No    Would the patient like to discuss contraceptive options today? No      Contraception Wrap Up   Current Method Hormonal Injection    End Method Hormonal Injection            Examination chaperoned by 09/05/22 LPN  Impression and Plan: 1. Encounter for  gynecological examination with Papanicolaou smear of cervix Pap sent Pap in 3 years if normal Physical in 1 year Labs with PCP Had normal mammogram 11/05/21. She is going cologuard  - Cytology - PAP( [place])  2. History of UTI [place] C&S sent  - POCT Urinalysis Dipstick - Urine Culture - Urinalysis, Routine w reflex microscopic  3. Encounter for screening fecal occult blood testing Hemoccult was negative  - POCT occult blood stool  4. Encounter for surveillance of injectable contraceptive Has refills on depo  5. Other microscopic hematuria Urine sent if +blood, will refer to urology

## 2022-07-07 LAB — URINALYSIS, ROUTINE W REFLEX MICROSCOPIC
Bilirubin, UA: NEGATIVE
Glucose, UA: NEGATIVE
Ketones, UA: NEGATIVE
Nitrite, UA: NEGATIVE
Protein,UA: NEGATIVE
Specific Gravity, UA: 1.018 (ref 1.005–1.030)
Urobilinogen, Ur: 0.2 mg/dL (ref 0.2–1.0)
pH, UA: 5.5 (ref 5.0–7.5)

## 2022-07-07 LAB — MICROSCOPIC EXAMINATION
Casts: NONE SEEN /lpf
Epithelial Cells (non renal): >10 /hpf — AB (ref 0–10)
RBC, Urine: NONE SEEN /hpf (ref 0–2)

## 2022-07-08 LAB — URINE CULTURE

## 2022-07-11 ENCOUNTER — Emergency Department (HOSPITAL_BASED_OUTPATIENT_CLINIC_OR_DEPARTMENT_OTHER): Payer: BC Managed Care – PPO

## 2022-07-11 ENCOUNTER — Other Ambulatory Visit: Payer: Self-pay

## 2022-07-11 ENCOUNTER — Emergency Department (HOSPITAL_BASED_OUTPATIENT_CLINIC_OR_DEPARTMENT_OTHER)
Admission: EM | Admit: 2022-07-11 | Discharge: 2022-07-11 | Disposition: A | Discharge status: Home / Self Care | Payer: BC Managed Care – PPO | Attending: Emergency Medicine | Admitting: Emergency Medicine

## 2022-07-11 ENCOUNTER — Encounter (HOSPITAL_BASED_OUTPATIENT_CLINIC_OR_DEPARTMENT_OTHER): Payer: Self-pay

## 2022-07-11 DIAGNOSIS — I10 Essential (primary) hypertension: Secondary | ICD-10-CM | POA: Diagnosis not present

## 2022-07-11 DIAGNOSIS — R82998 Other abnormal findings in urine: Secondary | ICD-10-CM | POA: Insufficient documentation

## 2022-07-11 DIAGNOSIS — R1084 Generalized abdominal pain: Secondary | ICD-10-CM | POA: Diagnosis not present

## 2022-07-11 DIAGNOSIS — R7989 Other specified abnormal findings of blood chemistry: Secondary | ICD-10-CM | POA: Diagnosis not present

## 2022-07-11 DIAGNOSIS — R109 Unspecified abdominal pain: Secondary | ICD-10-CM | POA: Diagnosis not present

## 2022-07-11 LAB — URINALYSIS, ROUTINE W REFLEX MICROSCOPIC
Bilirubin Urine: NEGATIVE
Glucose, UA: NEGATIVE mg/dL
Ketones, ur: NEGATIVE mg/dL
Nitrite: NEGATIVE
Protein, ur: 30 mg/dL — AB
Specific Gravity, Urine: 1.033 — ABNORMAL HIGH (ref 1.005–1.030)
pH: 5.5 (ref 5.0–8.0)

## 2022-07-11 LAB — COMPREHENSIVE METABOLIC PANEL
ALT: 11 U/L (ref 0–44)
AST: 13 U/L — ABNORMAL LOW (ref 15–41)
Albumin: 4.6 g/dL (ref 3.5–5.0)
Alkaline Phosphatase: 60 U/L (ref 38–126)
Anion gap: 11 (ref 5–15)
BUN: 12 mg/dL (ref 6–20)
CO2: 23 mmol/L (ref 22–32)
Calcium: 9.9 mg/dL (ref 8.9–10.3)
Chloride: 107 mmol/L (ref 98–111)
Creatinine, Ser: 1.08 mg/dL — ABNORMAL HIGH (ref 0.44–1.00)
GFR, Estimated: >60 mL/min (ref >60)
Glucose, Bld: 93 mg/dL (ref 70–99)
Potassium: 3.5 mmol/L (ref 3.5–5.1)
Sodium: 141 mmol/L (ref 135–145)
Total Bilirubin: 0.4 mg/dL (ref 0.3–1.2)
Total Protein: 7.9 g/dL (ref 6.5–8.1)

## 2022-07-11 LAB — PREGNANCY, URINE: Preg Test, Ur: NEGATIVE

## 2022-07-11 LAB — CBC
HCT: 42 % (ref 36.0–46.0)
Hemoglobin: 14.1 g/dL (ref 12.0–15.0)
MCH: 28.7 pg (ref 26.0–34.0)
MCHC: 33.6 g/dL (ref 30.0–36.0)
MCV: 85.5 fL (ref 80.0–100.0)
Platelets: 350 10*3/uL (ref 150–400)
RBC: 4.91 MIL/uL (ref 3.87–5.11)
RDW: 13.7 % (ref 11.5–15.5)
WBC: 9.9 10*3/uL (ref 4.0–10.5)
nRBC: 0 % (ref 0.0–0.2)

## 2022-07-11 LAB — LIPASE, BLOOD: Lipase: 36 U/L (ref 11–51)

## 2022-07-11 NOTE — Discharge Instructions (Addendum)
Please read and follow all provided instructions.  Your diagnoses today include:  1. Generalized abdominal pain     Tests performed today include: Blood cell counts and platelets Kidney and liver function tests Pancreas function test (called lipase) Urine test to look for infection: blood on dipstick, but no definite infections A blood or urine test for pregnancy (women only) CT scan of the abdomen and pelvis: does not show any sign of kidney stone or other significant problem in the abdomen Vital signs. See below for your results today.   Medications prescribed:  None  Take any prescribed medications only as directed.  Home care instructions:  Follow any educational materials contained in this packet.  Follow-up instructions: Please follow-up with your primary care provider in the next 7 days for further evaluation of your symptoms.    Return instructions:  SEEK IMMEDIATE MEDICAL ATTENTION IF: The pain does not go away or becomes severe  A temperature above 101F develops  Repeated vomiting occurs (multiple episodes)  The pain becomes localized to portions of the abdomen. The right side could possibly be appendicitis. In an adult, the left lower portion of the abdomen could be colitis or diverticulitis.  Blood is being passed in stools or vomit (bright red or black tarry stools)  You develop chest pain, difficulty breathing, dizziness or fainting, or become confused, poorly responsive, or inconsolable (young children) If you have any other emergent concerns regarding your health  Additional Information: Abdominal (belly) pain can be caused by many things. Your caregiver performed an examination and possibly ordered blood/urine tests and imaging (CT scan, x-rays, ultrasound). Many cases can be observed and treated at home after initial evaluation in the emergency department. Even though you are being discharged home, abdominal pain can be unpredictable. Therefore, you need a  repeated exam if your pain does not resolve, returns, or worsens. Most patients with abdominal pain don't have to be admitted to the hospital or have surgery, but serious problems like appendicitis and gallbladder attacks can start out as nonspecific pain. Many abdominal conditions cannot be diagnosed in one visit, so follow-up evaluations are very important.  Your vital signs today were: BP 132/77   Pulse 74   Temp 99.1 F (37.3 C) (Oral)   Resp 20   Ht 5\' 3"  (1.6 m)   Wt 92.1 kg   SpO2 100%   BMI 35.97 kg/m  If your blood pressure (bp) was elevated above 135/85 this visit, please have this repeated by your doctor within one month. --------------

## 2022-07-11 NOTE — ED Provider Notes (Signed)
[facility] EMERGENCY DEPT Provider Note   CSN: [id number] Arrival date & time: 07/11/22  1604     History  Chief Complaint  Patient presents with   Abdominal Pain    Karla Ward is a 51 y.o. female.  Patient with history of hypertension, GERD, endometrial ablation -- presents to the emergency department today for evaluation of abdominal pain that has been ongoing over the past 2 weeks.  She initially had pain in the lower abdomen, however since onset, has moved up more towards the periumbilical and upper abdominal area.  No associated nausea, vomiting, diarrhea.  She has had some increased urinary frequency but no dysuria or hematuria.  She denies fever or constipation as well.  Pain is sometimes worse in the left flank.  She has been seen by her providers recently.  There was initial question of UTI, however was not diagnosed.  Also states that provider has seen some blood prior as well.      Home Medications Prior to Admission medications   Medication Sig Start Date End Date Taking? Authorizing Provider  Albuterol Sulfate, sensor, (PROAIR DIGIHALER) 108 (90 Base) MCG/ACT AEPB Inhale into the lungs as needed.    [provider]  amLODipine (NORVASC) 5 MG tablet Take 5 mg by mouth daily.    [provider]  Ascorbic Acid (VITAMIN C PO) Take by mouth.    [provider]  meclizine (ANTIVERT) 25 MG tablet Take 50 mg by mouth 2 (two) times daily as needed. 03/09/21   [provider]  medroxyPROGESTERone (DEPO-PROVERA) 150 MG/ML injection INJECT 1 ML (150 MG TOTAL) INTO THE MUSCLE EVERY 3 (THREE) MONTHS 05/16/22   Adline Potter, NP  omeprazole (PRILOSEC) 20 MG capsule Take 20 mg by mouth daily.    [provider]  ondansetron (ZOFRAN ODT) 4 MG disintegrating tablet Take 1 tablet (4 mg total) by mouth every 8 (eight) hours as needed for nausea or vomiting. 12/29/18   Caccavale, Sophia, PA-C  sucralfate (CARAFATE) 1  GM/10ML suspension Take 10 mLs (1 g total) by mouth 4 (four) times daily -  with meals and at bedtime. 05/19/22 05/19/23  Lanelle Bal, DO  VITAMIN D PO Take by mouth daily.    [provider]      Allergies    Benadryl [diphenhydramine hcl (sleep)], Ciprofloxacin, Ibuprofen, Penicillins, and Vitamin d analogs    Review of Systems   Review of Systems  Physical Exam Updated Vital Signs BP 132/77   Pulse 74   Temp 99.1 F (37.3 C) (Oral)   Resp 20   Ht 5\' 3"  (1.6 m)   Wt 92.1 kg   SpO2 100%   BMI 35.97 kg/m  Physical Exam Vitals and nursing note reviewed.  Constitutional:      General: She is not in acute distress.    Appearance: She is well-developed.  HENT:     Head: Normocephalic and atraumatic.     Right Ear: External ear normal.     Left Ear: External ear normal.     Nose: Nose normal.  Eyes:     Conjunctiva/sclera: Conjunctivae normal.  Cardiovascular:     Rate and Rhythm: Normal rate and regular rhythm.     Heart sounds: No murmur heard. Pulmonary:     Effort: No respiratory distress.     Breath sounds: No wheezing, rhonchi or rales.  Abdominal:     Palpations: Abdomen is soft.     Tenderness: There is abdominal tenderness (  Mild) in the epigastric area and periumbilical area. There is no guarding or rebound. Negative signs include Murphy's sign and McBurney's sign.  Musculoskeletal:     Cervical back: Normal range of motion and neck supple.     Right lower leg: No edema.     Left lower leg: No edema.  Skin:    General: Skin is warm and dry.     Findings: No rash.  Neurological:     General: No focal deficit present.     Mental Status: She is alert. Mental status is at baseline.     Motor: No weakness.  Psychiatric:        Mood and Affect: Mood normal.    ED Results / Procedures / Treatments   Labs (all labs ordered are listed, but only abnormal results are displayed) Labs Reviewed  COMPREHENSIVE METABOLIC PANEL - Abnormal; Notable for the  following components:      Result Value   Creatinine, Ser 1.08 (*)    AST 13 (*)    All other components within normal limits  URINALYSIS, ROUTINE W REFLEX MICROSCOPIC - Abnormal; Notable for the following components:   Specific Gravity, Urine 1.033 (*)    Hgb urine dipstick TRACE (*)    Protein, ur 30 (*)    Leukocytes,Ua TRACE (*)    Bacteria, UA RARE (*)    All other components within normal limits  LIPASE, BLOOD  CBC  PREGNANCY, URINE    EKG None  Radiology CT Renal Stone Study  Result Date: 07/11/2022 CLINICAL DATA:  Left flank pain; kidney stone suspected EXAM: CT ABDOMEN AND PELVIS WITHOUT CONTRAST TECHNIQUE: Multidetector CT imaging of the abdomen and pelvis was performed following the standard protocol without IV contrast. RADIATION DOSE REDUCTION: This exam was performed according to the departmental dose-optimization program which includes automated exposure control, adjustment of the mA and/or kV according to patient size and/or use of iterative reconstruction technique. COMPARISON:  None Available. FINDINGS: Lower chest: No acute abnormality. Hepatobiliary: No focal liver abnormality is seen. No gallstones, gallbladder wall thickening, or biliary dilatation. Pancreas: Unremarkable. No pancreatic ductal dilatation or surrounding inflammatory changes. Spleen: Normal in size without focal abnormality. Adrenals/Urinary Tract: Adrenal glands are unremarkable. Kidneys are normal, without renal calculi, focal lesion, or hydronephrosis. Bladder is unremarkable. Stomach/Bowel: Stomach is within normal limits. Appendix appears normal. No evidence of bowel wall thickening, distention, or inflammatory changes. Vascular/Lymphatic: No significant vascular findings are present. No enlarged abdominal or pelvic lymph nodes. Reproductive: Uterus and bilateral adnexa are unremarkable. Other: No free intraperitoneal fluid or air Musculoskeletal: No acute or significant osseous findings. IMPRESSION:  Unremarkable CT abdomen and pelvis. Electronically Signed   By: Minerva Fester M.D.   On: 07/11/2022 21:10    Procedures Procedures    Medications Ordered in ED Medications - No data to display  ED Course/ Medical Decision Making/ A&P    Patient seen and examined. History obtained directly from patient. Work-up including labs, imaging, EKG ordered in triage, if performed, were reviewed.    Labs/EKG: Independently reviewed and interpreted.  This included: CBC unremarkable; CMP minimally elevated creatinine at 1.08 otherwise unremarkable; lipase normal; UA with trace leukocytes, trace blood on dipstick, 6-10 white blood cells and 0-5 on microscopy, not compelling for UTI.  Imaging: Discussed options with patient.  Discussed renal protocol CT to evaluate for signs of kidney stone or other intra-abdominal problem.  She agrees to proceed.  Medications/Fluids: None ordered  Most recent vital signs reviewed and are as  follows: BP 132/77   Pulse 74   Temp 99.1 F (37.3 C) (Oral)   Resp 20   Ht 5\' 3"  (1.6 m)   Wt 92.1 kg   SpO2 100%   BMI 35.97 kg/m   Initial impression: Abdominal pain, flank pain, question of blood in the urine or UTI  9:16 PM Reassessment performed. Patient appears comfortable.  Imaging personally visualized and interpreted including: CT imaging of the abdomen and pelvis, renal protocol, agree negative.  Reviewed pertinent lab work and imaging with patient at bedside. Questions answered.   Most current vital signs reviewed and are as follows: BP 132/77   Pulse 74   Temp 99.1 F (37.3 C) (Oral)   Resp 20   Ht 5\' 3"  (1.6 m)   Wt 92.1 kg   SpO2 100%   BMI 35.97 kg/m   Plan: Discharge to home.  Prescriptions written for: None  Other home care instructions discussed: Avoidance of triggers, OTC meds, heat  ED return instructions discussed: The patient was urged to return to the Emergency Department immediately with worsening of current symptoms,  worsening abdominal pain, persistent vomiting, blood noted in stools, fever, or any other concerns. The patient verbalized understanding.   Follow-up instructions discussed: Patient encouraged to follow-up with their PCP in 7 days.                            Medical Decision Making Amount and/or Complexity of Data Reviewed Labs: ordered. Radiology: ordered.   For this patient's complaint of abdominal pain, the following conditions were considered on the differential diagnosis: gastritis/PUD, enteritis/duodenitis, appendicitis, cholelithiasis/cholecystitis, cholangitis, pancreatitis, ruptured viscus, colitis, diverticulitis, small/large bowel obstruction, proctitis, cystitis, pyelonephritis, ureteral colic, aortic dissection, aortic aneurysm. In women, ectopic pregnancy, pelvic inflammatory disease, ovarian cysts, and tubo-ovarian abscess were also considered. Atypical chest etiologies were also considered including ACS, PE, and pneumonia.   Overall lab work-up is reassuring.  No signs of UTI.  Given question of hematuria, renal protocol CT was performed and was negative.  Patient stable during ED stay.  She appears well, nontoxic.  The patient's vital signs, pertinent lab work and imaging were reviewed and interpreted as discussed in the ED course. Hospitalization was considered for further testing, treatments, or serial exams/observation. However as patient is well-appearing, has a stable exam, and reassuring studies today, I do not feel that they warrant admission at this time. This plan was discussed with the patient who verbalizes agreement and comfort with this plan and seems reliable and able to return to the Emergency Department with worsening or changing symptoms.          Final Clinical Impression(s) / ED Diagnoses Final diagnoses:  Generalized abdominal pain    Rx / DC Orders ED Discharge Orders     None         07/11/22 2154    09/10/22,  MD 07/12/22 0111

## 2022-07-11 NOTE — ED Triage Notes (Signed)
Patient here POV from Home.  Endorses Bilateral Lower Back and ABD Pain for approximately 2 Weeks.  Associated with Urinary Frequency. No N/V/D. No Constipation. No Fevers.   NAD Noted during Triage. A&Ox4. GCS 15.  Ambulatory.

## 2022-07-11 NOTE — ED Notes (Signed)
Pt verbalizes understanding of discharge instructions. Opportunity for questioning and answers were provided. Pt discharged from ED to home.   ? ?

## 2022-07-13 DIAGNOSIS — E559 Vitamin D deficiency, unspecified: Secondary | ICD-10-CM | POA: Diagnosis not present

## 2022-07-13 DIAGNOSIS — R1030 Lower abdominal pain, unspecified: Secondary | ICD-10-CM | POA: Diagnosis not present

## 2022-07-13 DIAGNOSIS — E7800 Pure hypercholesterolemia, unspecified: Secondary | ICD-10-CM | POA: Diagnosis not present

## 2022-07-13 DIAGNOSIS — R350 Frequency of micturition: Secondary | ICD-10-CM | POA: Diagnosis not present

## 2022-07-13 LAB — CYTOLOGY - PAP
Chlamydia: NEGATIVE
Comment: NEGATIVE
Comment: NEGATIVE
Comment: NORMAL
Diagnosis: NEGATIVE
High risk HPV: NEGATIVE
Neisseria Gonorrhea: NEGATIVE

## 2022-07-14 ENCOUNTER — Telehealth: Payer: Self-pay

## 2022-07-14 NOTE — Telephone Encounter (Signed)
Returned the pt's call from this morning. LMOVM for the pt to return call.

## 2022-07-20 ENCOUNTER — Other Ambulatory Visit (HOSPITAL_COMMUNITY): Payer: Self-pay | Admitting: Nurse Practitioner

## 2022-07-20 ENCOUNTER — Other Ambulatory Visit: Payer: Self-pay | Admitting: Nurse Practitioner

## 2022-07-20 DIAGNOSIS — R1030 Lower abdominal pain, unspecified: Secondary | ICD-10-CM

## 2022-07-20 DIAGNOSIS — R109 Unspecified abdominal pain: Secondary | ICD-10-CM

## 2022-07-22 ENCOUNTER — Other Ambulatory Visit (HOSPITAL_COMMUNITY): Payer: BC Managed Care – PPO

## 2022-07-25 ENCOUNTER — Ambulatory Visit (HOSPITAL_COMMUNITY)
Admission: RE | Admit: 2022-07-25 | Discharge: 2022-07-25 | Disposition: A | Discharge status: Home / Self Care | Payer: BC Managed Care – PPO | Source: Ambulatory Visit | Attending: Nurse Practitioner | Admitting: Nurse Practitioner

## 2022-07-25 DIAGNOSIS — R1030 Lower abdominal pain, unspecified: Secondary | ICD-10-CM | POA: Diagnosis not present

## 2022-07-25 DIAGNOSIS — R109 Unspecified abdominal pain: Secondary | ICD-10-CM | POA: Insufficient documentation

## 2022-07-25 DIAGNOSIS — D251 Intramural leiomyoma of uterus: Secondary | ICD-10-CM | POA: Diagnosis not present

## 2022-08-05 ENCOUNTER — Encounter: Payer: BC Managed Care – PPO | Admitting: Dietician

## 2022-08-11 ENCOUNTER — Ambulatory Visit (INDEPENDENT_AMBULATORY_CARE_PROVIDER_SITE_OTHER): Payer: BC Managed Care – PPO | Admitting: *Deleted

## 2022-08-11 DIAGNOSIS — Z3042 Encounter for surveillance of injectable contraceptive: Secondary | ICD-10-CM

## 2022-08-11 MED ORDER — MEDROXYPROGESTERONE ACETATE 150 MG/ML IM SUSP
150.0000 mg | Freq: Once | INTRAMUSCULAR | Status: AC
  Administered 2022-08-11: 150 mg via INTRAMUSCULAR

## 2022-08-11 NOTE — Progress Notes (Signed)
   NURSE VISIT- INJECTION  SUBJECTIVE:  Karla Ward is a 51 y.o. [phone] female here for a Depo Provera for contraception/period management. She is a GYN patient.   OBJECTIVE:  There were no vitals taken for this visit.  Appears well, in no apparent distress  Injection administered in: Right deltoid  Meds ordered this encounter  Medications   medroxyPROGESTERone (DEPO-PROVERA) injection 150 mg    ASSESSMENT: GYN patient Depo Provera for contraception/period management PLAN: Follow-up: in 11-13 weeks for next Depo   Levy Pupa  08/11/2022 10:21 AM

## 2022-08-12 ENCOUNTER — Ambulatory Visit: Payer: BC Managed Care – PPO

## 2022-08-15 ENCOUNTER — Ambulatory Visit
Admission: EM | Admit: 2022-08-15 | Discharge: 2022-08-15 | Disposition: A | Discharge status: Home / Self Care | Payer: BC Managed Care – PPO | Attending: Family Medicine | Admitting: Family Medicine

## 2022-08-15 ENCOUNTER — Encounter: Payer: Self-pay | Admitting: Emergency Medicine

## 2022-08-15 DIAGNOSIS — R059 Cough, unspecified: Secondary | ICD-10-CM | POA: Insufficient documentation

## 2022-08-15 DIAGNOSIS — J069 Acute upper respiratory infection, unspecified: Secondary | ICD-10-CM | POA: Diagnosis not present

## 2022-08-15 DIAGNOSIS — U071 COVID-19: Secondary | ICD-10-CM | POA: Insufficient documentation

## 2022-08-15 DIAGNOSIS — Z79899 Other long term (current) drug therapy: Secondary | ICD-10-CM | POA: Insufficient documentation

## 2022-08-15 DIAGNOSIS — R300 Dysuria: Secondary | ICD-10-CM | POA: Diagnosis not present

## 2022-08-15 LAB — POCT URINALYSIS DIP (MANUAL ENTRY)
Bilirubin, UA: NEGATIVE
Glucose, UA: NEGATIVE mg/dL
Ketones, POC UA: NEGATIVE mg/dL
Leukocytes, UA: NEGATIVE
Nitrite, UA: NEGATIVE
Protein Ur, POC: 100 mg/dL — AB
Spec Grav, UA: >=1.03 — AB (ref 1.010–1.025)
Urobilinogen, UA: 0.2 E.U./dL
pH, UA: 5.5 (ref 5.0–8.0)

## 2022-08-15 MED ORDER — HYDROCOD POLI-CHLORPHE POLI ER 10-8 MG/5ML PO SUER: 5.0000 mL | Freq: Two times a day (BID) | ORAL | 0 refills | Status: DC | PRN

## 2022-08-15 NOTE — ED Triage Notes (Signed)
Pt reports a cough, fever, chills, headache and body aches. States symptoms began Saturday. Noticed a fever last night and took Tylenol.

## 2022-08-15 NOTE — ED Provider Notes (Signed)
[facility]    CSN: [id number] Arrival date & time: 08/15/22  [id number]      History   Chief Complaint Chief Complaint  Patient presents with   Cough   Generalized Body Aches   Chills   Headache    HPI Karla Ward is a 51 y.o. female.   Presenting today with 2-day history of cough, chills, fever, headache, body aches, nasal congestion, fatigue.  Denies chest pain, shortness of breath, abdominal pain, nausea vomiting or diarrhea.  Trying Tylenol with minimal relief.  No known history of pertinent chronic medical problems per patient.  No known sick contacts recently.    Past Medical History:  Diagnosis Date   Anemia    Dizziness    Gastritis    GERD (gastroesophageal reflux disease)    Hypertension    Iron deficiency    Meniere disease    Status post dilation of esophageal narrowing    Vertigo    Vitamin D deficiency     Patient Active Problem List   Diagnosis Date Noted   History of UTI 07/06/2022   Other microscopic hematuria 07/06/2022   Constipation 07/05/2022   Urinary tract infection with hematuria 06/22/2022   Pelvic pain 06/22/2022   Urinary frequency 06/22/2022   Acute bilateral low back pain without sciatica 06/22/2022   Encounter for surveillance of injectable contraceptive 06/21/2021   Encounter for screening fecal occult blood testing 06/21/2021   Encounter for well woman exam with routine gynecological exam 06/21/2021   Atypical squamous cell changes of undetermined significance (ASCUS) on cervical cytology with negative high risk human papilloma virus (HPV) test result 06/21/2021   Encounter for colorectal cancer screening 01/20/2020   Encounter for gynecological examination with Papanicolaou smear of cervix 01/20/2020   Colon cancer screening 07/06/2017   N&V (nausea and vomiting) 09/14/2016   Menorrhagia 05/30/2016   GERD (gastroesophageal reflux disease) 08/13/2015   Palpitations 11/22/2012    Past Surgical History:   Procedure Laterality Date   DILITATION & CURRETTAGE/HYSTROSCOPY WITH NOVASURE ABLATION N/A 04/05/2017   Procedure: DILATATION & CURETTAGE/HYSTEROSCOPY WITH NOVASURE ENDOMETRIAL ABLATION;  Surgeon: Florian Buff, MD;  Location: [facility];  Service: Gynecology;  Laterality: N/A;   ESOPHAGOGASTRODUODENOSCOPY N/A 09/02/2015   [id number] non-erosive gastritis/stricture at the gastroesophageal junction    NONE TO DATE  08/13/15   SAVORY DILATION  09/02/2015   Procedure: SAVORY DILATION;  Surgeon: Danie Binder, MD;  Location: [facility];  Service: Endoscopy;;    OB History     Gravida  2   Para  2   Term      Preterm  2   AB      Living  2      SAB      IAB      Ectopic      Multiple      Live Births  2            Home Medications    Prior to Admission medications   Medication Sig Start Date End Date Taking? Authorizing Provider  chlorpheniramine-HYDROcodone (TUSSIONEX) 10-8 MG/5ML Take 5 mLs by mouth every 12 (twelve) hours as needed for cough. 08/15/22  Yes Volney American, PA-C  Albuterol Sulfate, sensor, (PROAIR DIGIHALER) 108 (90 Base) MCG/ACT AEPB Inhale into the lungs as needed.    [provider]  amLODipine (NORVASC) 5 MG tablet Take 5 mg by mouth daily.    [provider]  Ascorbic Acid (VITAMIN C PO) Take  by mouth.    [provider]  meclizine (ANTIVERT) 25 MG tablet Take 50 mg by mouth 2 (two) times daily as needed. 03/09/21   [provider]  medroxyPROGESTERone (DEPO-PROVERA) 150 MG/ML injection INJECT 1 ML (150 MG TOTAL) INTO THE MUSCLE EVERY 3 (THREE) MONTHS 05/16/22   Estill Dooms, NP  omeprazole (PRILOSEC) 20 MG capsule Take 20 mg by mouth daily.    [provider]  ondansetron (ZOFRAN ODT) 4 MG disintegrating tablet Take 1 tablet (4 mg total) by mouth every 8 (eight) hours as needed for nausea or vomiting. 12/29/18   Caccavale, Sophia, PA-C  sucralfate (CARAFATE) 1 GM/10ML suspension Take 10 mLs  (1 g total) by mouth 4 (four) times daily -  with meals and at bedtime. 05/19/22 05/19/23  Eloise Harman, DO  VITAMIN D PO Take by mouth daily.    [provider]    Family History Family History  Problem Relation Age of Onset   Throat cancer Father 32   Hypertension Father    Hypertension Mother    Congestive Heart Failure Sister    Colon cancer Neg Hx    Colon polyps Neg Hx     Social History Social History   Tobacco Use   Smoking status: Never   Smokeless tobacco: Never  Vaping Use   Vaping Use: Never used  Substance Use Topics   Alcohol use: No   Drug use: No     Allergies   Benadryl [diphenhydramine hcl (sleep)], Ciprofloxacin, Ibuprofen, Penicillins, and Vitamin d analogs   Review of Systems Review of Systems Per HPI  Physical Exam Triage Vital Signs ED Triage Vitals [08/15/22 0844]  Enc Vitals Group     BP (!) 148/89     Pulse Rate 97     Resp 16     Temp 99.2 F (37.3 C)     Temp Source Oral     SpO2 98 %     Weight      Height      Head Circumference      Peak Flow      Pain Score 7     Pain Loc      Pain Edu?      Excl. in [place]?    No data found.  Updated Vital Signs BP (!) 148/89 (BP Location: Right Arm)   Pulse 97   Temp 99.2 F (37.3 C) (Oral)   Resp 16   SpO2 98%   Visual Acuity Right Eye Distance:   Left Eye Distance:   Bilateral Distance:    Right Eye Near:   Left Eye Near:    Bilateral Near:     Physical Exam Vitals and nursing note reviewed.  Constitutional:      Appearance: Normal appearance. She is not ill-appearing.  HENT:     Head: Atraumatic.     Right Ear: Tympanic membrane and external ear normal.     Left Ear: Tympanic membrane and external ear normal.     Nose: Rhinorrhea present.     Mouth/Throat:     Mouth: Mucous membranes are moist.     Pharynx: Posterior oropharyngeal erythema present.  Eyes:     Extraocular Movements: Extraocular movements intact.     Conjunctiva/sclera: Conjunctivae  normal.  Cardiovascular:     Rate and Rhythm: Normal rate and regular rhythm.     Heart sounds: Normal heart sounds.  Pulmonary:     Effort: Pulmonary effort is normal.  Breath sounds: Normal breath sounds. No wheezing or rales.  Abdominal:     General: Bowel sounds are normal. There is no distension.     Palpations: Abdomen is soft.     Tenderness: There is no abdominal tenderness. There is no right CVA tenderness, left CVA tenderness or guarding.  Musculoskeletal:        General: Normal range of motion.     Cervical back: Normal range of motion and neck supple.  Skin:    General: Skin is warm and dry.  Neurological:     Mental Status: She is alert and oriented to person, place, and time.     Motor: No weakness.     Gait: Gait normal.  Psychiatric:        Mood and Affect: Mood normal.        Thought Content: Thought content normal.        Judgment: Judgment normal.    UC Treatments / Results  Labs (all labs ordered are listed, but only abnormal results are displayed) Labs Reviewed  POCT URINALYSIS DIP (MANUAL ENTRY) - Abnormal; Notable for the following components:      Result Value   Spec Grav, UA >=1.030 (*)    Blood, UA large (*)    Protein Ur, POC =100 (*)    All other components within normal limits  RESP PANEL BY RT-PCR (FLU A&B, COVID) [id number]   EKG  Radiology No results found.  Procedures Procedures (including critical care time)  Medications Ordered in UC Medications - No data to display  Initial Impression / Assessment and Plan / UC Course  I have reviewed the triage vital signs and the nursing notes.  Pertinent labs & imaging results that were available during my care of the patient were reviewed by me and considered in my medical decision making (see chart for details).     Vital signs and exam overall reassuring and suggestive of a viral upper respiratory infection.  She also complained of some mild dysuria, frequency during exam and requested a  urinalysis.  This was negative for evidence of a urinary tract infection.  Discussed to push fluids, continue good fever control, over-the-counter cold and congestion medication and will prescribe cough syrup to help with her symptoms.  Respiratory panel pending, interested in antiviral therapy if positive.  Work note given.  Final Clinical Impressions(s) / UC Diagnoses   Final diagnoses:  Viral URI with cough  Dysuria   Discharge Instructions   None    ED Prescriptions     Medication Sig Dispense Auth. Provider   chlorpheniramine-HYDROcodone (TUSSIONEX) 10-8 MG/5ML Take 5 mLs by mouth every 12 (twelve) hours as needed for cough. 100 mL Volney American, [place]      I have reviewed the PDMP during this encounter.   Volney American, [place] 08/15/22 1450

## 2022-08-16 LAB — RESP PANEL BY RT-PCR (FLU A&B, COVID) ARPGX2
Influenza A by PCR: NEGATIVE
Influenza B by PCR: NEGATIVE
SARS Coronavirus 2 by RT PCR: POSITIVE — AB

## 2022-08-26 ENCOUNTER — Telehealth: Payer: Self-pay

## 2022-08-26 NOTE — Telephone Encounter (Signed)
Spoke with patient and transferred her to front desk to schedule appointment with provider.

## 2022-08-26 NOTE — Telephone Encounter (Signed)
Patient would like to speak to Dr. Elonda Husky about a condition she is having.

## 2022-08-30 ENCOUNTER — Ambulatory Visit: Payer: BC Managed Care – PPO | Admitting: Obstetrics & Gynecology

## 2022-08-30 ENCOUNTER — Encounter: Payer: Self-pay | Admitting: Obstetrics & Gynecology

## 2022-08-30 VITALS — BP 117/73 | HR 90 | Ht 63.0 in

## 2022-08-30 DIAGNOSIS — N029 Recurrent and persistent hematuria with unspecified morphologic changes: Secondary | ICD-10-CM | POA: Diagnosis not present

## 2022-08-30 DIAGNOSIS — N3001 Acute cystitis with hematuria: Secondary | ICD-10-CM | POA: Diagnosis not present

## 2022-08-30 DIAGNOSIS — N763 Subacute and chronic vulvitis: Secondary | ICD-10-CM | POA: Diagnosis not present

## 2022-08-30 NOTE — Progress Notes (Signed)
Follow up appointment for results: sonogram  Chief Complaint  Patient presents with   Follow-up    Review ultrasound from 07/25/22    Blood pressure 117/73, pulse 90, height 5\' 3"  (1.6 m).    CLINICAL DATA:  Lower abdominal pain.   EXAM: TRANSABDOMINAL AND TRANSVAGINAL ULTRASOUND OF PELVIS   TECHNIQUE: Both transabdominal and transvaginal ultrasound examinations of the pelvis were performed. Transabdominal technique was performed for global imaging of the pelvis including uterus, ovaries, adnexal regions, and pelvic cul-de-sac. It was necessary to proceed with endovaginal exam following the transabdominal exam to visualize the adnexal structures.   COMPARISON:  CT renal stone July 11, 2022   FINDINGS: Uterus   Measurements: 7.3 x 3.6 x 6.2 cm = volume: 84.2 mL. There is a 1.4 x 1.0 x 1.0 cm intramural fibroid within the posterior uterine body. There is a 0.9 x 1.1 x 1.1 cm intramural fibroid within the anterior left uterine body.   Endometrium   Thickness: 6 mm.  Small amount of fluid in the endometrium.   Right ovary   Measurements: 2.6 x 1.2 x 1.6 cm = volume: 2.6 mL. Normal appearance/no adnexal mass.   Left ovary   Measurements: 1.9 x 1.5 x 2.3 cm = volume: 3.6 mL. Normal appearance/no adnexal mass.   Other findings   Small amount of fluid in the pelvis.   IMPRESSION: Small amount of nonspecific fluid within the endometrium.   Fibroid uterus.     Electronically Signed   By: Lovey Newcomer M.D.   On: 07/25/2022 10:49  MEDS ordered this encounter: No orders of the defined types were placed in this encounter.   Orders for this encounter: No orders of the defined types were placed in this encounter.   Impression + Management Plan   ICD-10-CM   1. Persistent hematuria  N02.9    negative cultures and negative stone studies, refer to Dr Alyson Ingles for probable cystoscpy to evaluate further      Follow Up: Return for refer to Dr Alyson Ingles,  urology, for evaluation of persistent hematuria, unknown etiology.     All questions were answered.  Past Medical History:  Diagnosis Date   Anemia    Dizziness    Gastritis    GERD (gastroesophageal reflux disease)    Hypertension    Iron deficiency    Meniere disease    Status post dilation of esophageal narrowing    Vertigo    Vitamin D deficiency     Past Surgical History:  Procedure Laterality Date   DILITATION & CURRETTAGE/HYSTROSCOPY WITH NOVASURE ABLATION N/A 04/05/2017   Procedure: DILATATION & CURETTAGE/HYSTEROSCOPY WITH NOVASURE ENDOMETRIAL ABLATION;  Surgeon: Florian Buff, MD;  Location: [facility];  Service: Gynecology;  Laterality: N/A;   ESOPHAGOGASTRODUODENOSCOPY N/A 09/02/2015   [id number] non-erosive gastritis/stricture at the gastroesophageal junction    NONE TO DATE  08/13/15   SAVORY DILATION  09/02/2015   Procedure: SAVORY DILATION;  Surgeon: Danie Binder, MD;  Location: [facility];  Service: Endoscopy;;    OB History     Gravida  2   Para  2   Term      Preterm  2   AB      Living  2      SAB      IAB      Ectopic      Multiple      Live Births  2           Allergies  Allergen Reactions   Benadryl [Diphenhydramine Hcl (Sleep)]     tachycardia   Ciprofloxacin    Ibuprofen    Penicillins Hives    Has patient had a PCN reaction causing immediate rash, facial/tongue/throat swelling, SOB or lightheadedness with hypotension: pt does not remember  Has patient had a PCN reaction causing severe rash involving mucus membranes or skin necrosis: No Has patient had a PCN reaction that required hospitalization No Has patient had a PCN reaction occurring within the last 10 years: No     Vitamin D Analogs Other (See Comments)    Unable to tolerate 50000 dose; palpitations    Social History   Socioeconomic History   Marital status: Married    Spouse name: Not on file   Number of children: 2   Years of education: Not on file    Highest education level: Not on file  Occupational History   Not on file  Tobacco Use   Smoking status: Never   Smokeless tobacco: Never  Vaping Use   Vaping Use: Never used  Substance and Sexual Activity   Alcohol use: No   Drug use: No   Sexual activity: Yes    Birth control/protection: Surgical, Injection    Comment: ablation  Other Topics Concern   Not on file  Social History Narrative   Lives with husband   Right handed   Caffeine- some    Social Determinants of Health   Financial Resource Strain: Low Risk  (07/06/2022)   Overall Financial Resource Strain (CARDIA)    Difficulty of Paying Living Expenses: Not hard at all  Food Insecurity: No Food Insecurity (07/06/2022)   Hunger Vital Sign    Worried About Running Out of Food in the Last Year: Never true    Ran Out of Food in the Last Year: Never true  Transportation Needs: No Transportation Needs (07/06/2022)   PRAPARE - Hydrologist (Medical): No    Lack of Transportation (Non-Medical): No  Physical Activity: Inactive (07/06/2022)   Exercise Vital Sign    Days of Exercise per Week: 0 days    Minutes of Exercise per Session: 0 min  Stress: No Stress Concern Present (07/06/2022)   [place]    Feeling of Stress : Only a little  Social Connections: Socially Integrated (07/06/2022)   Social Connection and Isolation Panel [NHANES]    Frequency of Communication with Friends and Family: More than three times a week    Frequency of Social Gatherings with Friends and Family: Twice a week    Attends Religious Services: More than 4 times per year    Active Member of [employer] or Organizations: Yes    Attends Music therapist: More than 4 times per year    Marital Status: Married    Family History  Problem Relation Age of Onset   Throat cancer Father 76   Hypertension Father    Hypertension Mother    Congestive Heart Failure  Sister    Colon cancer Neg Hx    Colon polyps Neg Hx

## 2022-08-31 ENCOUNTER — Other Ambulatory Visit: Payer: Self-pay | Admitting: *Deleted

## 2022-08-31 DIAGNOSIS — N029 Recurrent and persistent hematuria with unspecified morphologic changes: Secondary | ICD-10-CM

## 2022-09-06 ENCOUNTER — Ambulatory Visit: Payer: BC Managed Care – PPO | Admitting: Urology

## 2022-09-06 ENCOUNTER — Encounter: Payer: Self-pay | Admitting: Urology

## 2022-09-06 VITALS — BP 148/77 | HR 123

## 2022-09-06 DIAGNOSIS — R310 Gross hematuria: Secondary | ICD-10-CM | POA: Diagnosis not present

## 2022-09-06 LAB — BLADDER SCAN AMB NON-IMAGING: Scan Result: 0

## 2022-09-06 NOTE — Progress Notes (Signed)
post void residual=0 ?

## 2022-09-06 NOTE — Patient Instructions (Signed)

## 2022-09-06 NOTE — Progress Notes (Signed)
09/06/2022 10:01 AM   Darel Hong Roanna Epley May 20, 1971 [id number]  Referring provider: Lazaro Arms, MD [address] [place],  [place] [address]  Microhematuria   HPI: Ms Gellis 25JS here for evaluation of microhematuria. She was diagnosed with microhematuria 3 months ago. She underwent CT stone study 07/2022 which did not show any calculi. NO hx of tobacco abuse. She is a Lawyer. No family hx of microhematuria   PMH: Past Medical History:  Diagnosis Date   Anemia    Dizziness    Gastritis    GERD (gastroesophageal reflux disease)    Hypertension    Iron deficiency    Meniere disease    Status post dilation of esophageal narrowing    Vertigo    Vitamin D deficiency     Surgical History: Past Surgical History:  Procedure Laterality Date   DILITATION & CURRETTAGE/HYSTROSCOPY WITH NOVASURE ABLATION N/A 04/05/2017   Procedure: DILATATION & CURETTAGE/HYSTEROSCOPY WITH NOVASURE ENDOMETRIAL ABLATION;  Surgeon: Lazaro Arms, MD;  Location: [facility];  Service: Gynecology;  Laterality: N/A;   ESOPHAGOGASTRODUODENOSCOPY N/A 09/02/2015   [id number]:[id number] non-erosive gastritis/stricture at the gastroesophageal junction    NONE TO DATE  08/13/15   SAVORY DILATION  09/02/2015   Procedure: SAVORY DILATION;  Surgeon: West Bali, MD;  Location: [facility];  Service: Endoscopy;;    Home Medications:  Allergies as of 09/06/2022       Reactions   Benadryl [diphenhydramine Hcl (sleep)]    tachycardia   Ciprofloxacin    Ibuprofen    Penicillins Hives   Has patient had a PCN reaction causing immediate rash, facial/tongue/throat swelling, SOB or lightheadedness with hypotension: pt does not remember  Has patient had a PCN reaction causing severe rash involving mucus membranes or skin necrosis: No Has patient had a PCN reaction that required hospitalization No Has patient had a PCN reaction occurring within the last 10 years: No   Vitamin D Analogs Other (See Comments)   Unable to  tolerate 50000 dose; palpitations        Medication List        Accurate as of September 06, 2022 10:01 AM. If you have any questions, ask your nurse or doctor.          amLODipine 5 MG tablet Commonly known as: NORVASC Take 5 mg by mouth daily.   meclizine 25 MG tablet Commonly known as: ANTIVERT Take 50 mg by mouth 2 (two) times daily as needed.   medroxyPROGESTERone 150 MG/ML injection Commonly known as: DEPO-PROVERA INJECT 1 ML (150 MG TOTAL) INTO THE MUSCLE EVERY 3 (THREE) MONTHS   omeprazole 20 MG capsule Commonly known as: PRILOSEC Take 20 mg by mouth daily.   ondansetron 4 MG disintegrating tablet Commonly known as: Zofran ODT Take 1 tablet (4 mg total) by mouth every 8 (eight) hours as needed for nausea or vomiting.   ProAir Digihaler 108 (90 Base) MCG/ACT Aepb Generic drug: Albuterol Sulfate (sensor) Inhale into the lungs as needed.   sucralfate 1 GM/10ML suspension Commonly known as: CARAFATE Take 10 mLs (1 g total) by mouth 4 (four) times daily -  with meals and at bedtime.   VITAMIN C PO Take by mouth.   VITAMIN D PO Take by mouth daily.        Allergies:  Allergies  Allergen Reactions   Benadryl [Diphenhydramine Hcl (Sleep)]     tachycardia   Ciprofloxacin    Ibuprofen    Penicillins Hives  Has patient had a PCN reaction causing immediate rash, facial/tongue/throat swelling, SOB or lightheadedness with hypotension: pt does not remember  Has patient had a PCN reaction causing severe rash involving mucus membranes or skin necrosis: No Has patient had a PCN reaction that required hospitalization No Has patient had a PCN reaction occurring within the last 10 years: No     Vitamin D Analogs Other (See Comments)    Unable to tolerate 50000 dose; palpitations    Family History: Family History  Problem Relation Age of Onset   Throat cancer Father 90   Hypertension Father    Hypertension Mother    Congestive Heart Failure Sister     Colon cancer Neg Hx    Colon polyps Neg Hx     Social History:  reports that she has never smoked. She has never used smokeless tobacco. She reports that she does not drink alcohol and does not use drugs.  ROS: All other review of systems were reviewed and are negative except what is noted above in HPI  Physical Exam: BP (!) 148/77   Pulse (!) 123   Constitutional:  Alert and oriented, No acute distress. HEENT: [place] AT, moist mucus membranes.  Trachea midline, no masses. Cardiovascular: No clubbing, cyanosis, or edema. Respiratory: Normal respiratory effort, no increased work of breathing. GI: Abdomen is soft, nontender, nondistended, no abdominal masses GU: No CVA tenderness.  Lymph: No cervical or inguinal lymphadenopathy. Skin: No rashes, bruises or suspicious lesions. Neurologic: Grossly intact, no focal deficits, moving all 4 extremities. Psychiatric: Normal mood and affect.  Laboratory Data: Lab Results  Component Value Date   WBC 9.9 07/11/2022   HGB 14.1 07/11/2022   HCT 42.0 07/11/2022   MCV 85.5 07/11/2022   PLT 350 07/11/2022    Lab Results  Component Value Date   CREATININE 1.08 (H) 07/11/2022    No results found for: "PSA"  No results found for: "TESTOSTERONE"  No results found for: "HGBA1C"  Urinalysis    Component Value Date/Time   COLORURINE YELLOW 07/11/2022 1619   APPEARANCEUR CLEAR 07/11/2022 1619   APPEARANCEUR Cloudy (A) 07/06/2022 1400   LABSPEC 1.033 (H) 07/11/2022 1619   PHURINE 5.5 07/11/2022 1619   GLUCOSEU NEGATIVE 07/11/2022 1619   HGBUR TRACE (A) 07/11/2022 1619   BILIRUBINUR negative 08/15/2022 0904   BILIRUBINUR Negative 07/06/2022 1400   KETONESUR negative 08/15/2022 0904   KETONESUR NEGATIVE 07/11/2022 1619   PROTEINUR =100 (A) 08/15/2022 0904   PROTEINUR 30 (A) 07/11/2022 1619   UROBILINOGEN 0.2 08/15/2022 0904   UROBILINOGEN 1.0 08/14/2015 2340   NITRITE Negative 08/15/2022 0904   NITRITE NEGATIVE 07/11/2022 1619    LEUKOCYTESUR Negative 08/15/2022 0904   LEUKOCYTESUR TRACE (A) 07/11/2022 1619    Lab Results  Component Value Date   LABMICR See below: 07/06/2022   WBCUA 6-10 (A) 07/06/2022   LABEPIT >10 (A) 07/06/2022   BACTERIA RARE (A) 07/11/2022    Pertinent Imaging: CT 07/2022: Images reviewed and discussed with the patient  No results found for this or any previous visit.  No results found for this or any previous visit.  No results found for this or any previous visit.  No results found for this or any previous visit.  No results found for this or any previous visit.  No valid procedures specified. No results found for this or any previous visit.  Results for orders placed during the hospital encounter of 07/11/22  CT Renal Stone Study  Narrative CLINICAL DATA:  Left  flank pain; kidney stone suspected  EXAM: CT ABDOMEN AND PELVIS WITHOUT CONTRAST  TECHNIQUE: Multidetector CT imaging of the abdomen and pelvis was performed following the standard protocol without IV contrast.  RADIATION DOSE REDUCTION: This exam was performed according to the departmental dose-optimization program which includes automated exposure control, adjustment of the mA and/or kV according to patient size and/or use of iterative reconstruction technique.  COMPARISON:  None Available.  FINDINGS: Lower chest: No acute abnormality.  Hepatobiliary: No focal liver abnormality is seen. No gallstones, gallbladder wall thickening, or biliary dilatation.  Pancreas: Unremarkable. No pancreatic ductal dilatation or surrounding inflammatory changes.  Spleen: Normal in size without focal abnormality.  Adrenals/Urinary Tract: Adrenal glands are unremarkable. Kidneys are normal, without renal calculi, focal lesion, or hydronephrosis. Bladder is unremarkable.  Stomach/Bowel: Stomach is within normal limits. Appendix appears normal. No evidence of bowel wall thickening, distention, or inflammatory  changes.  Vascular/Lymphatic: No significant vascular findings are present. No enlarged abdominal or pelvic lymph nodes.  Reproductive: Uterus and bilateral adnexa are unremarkable.  Other: No free intraperitoneal fluid or air  Musculoskeletal: No acute or significant osseous findings.  IMPRESSION: Unremarkable CT abdomen and pelvis.   Electronically Signed By: Placido Sou M.D. On: 07/11/2022 21:10   Assessment & Plan:    1. Microhematuria -Urine for cytology -BMP -CT hematuria -Office cystoscopy - Urinalysis, Routine w reflex microscopic - BLADDER SCAN AMB NON-IMAGING   No follow-ups on file.  Nicolette Bang, MD  [facility]

## 2022-09-07 LAB — URINALYSIS, ROUTINE W REFLEX MICROSCOPIC
Bilirubin, UA: NEGATIVE
Glucose, UA: NEGATIVE
Ketones, UA: NEGATIVE
Nitrite, UA: NEGATIVE
Specific Gravity, UA: 1.025 (ref 1.005–1.030)
Urobilinogen, Ur: 0.2 mg/dL (ref 0.2–1.0)
pH, UA: 5.5 (ref 5.0–7.5)

## 2022-09-07 LAB — MICROSCOPIC EXAMINATION

## 2022-09-08 ENCOUNTER — Telehealth: Payer: Self-pay

## 2022-09-08 NOTE — Telephone Encounter (Signed)
CT protocol is prednisone and benadryl.  Patient has an allergy to benadryl, reaction is tachycardia. Please advise on another alternative.

## 2022-09-08 NOTE — Telephone Encounter (Signed)
I called patient to advise her of the day and time of her CT. Patient notified me that she is allergic to contrast. I wanted to verify it you wanted to reorder the CT Abd and Pelv W/O Contrast. I know the CT Hematuria is with and W/O.

## 2022-09-09 ENCOUNTER — Telehealth: Payer: Self-pay

## 2022-09-09 NOTE — Telephone Encounter (Signed)
Patient states she is having pain in her abdomen that come and goes and is having urine frequency.  Patient will drop urine off for culture on 09/12/2022.

## 2022-09-09 NOTE — Telephone Encounter (Signed)
I called to discuss CT protocol with patient. Patient does not wish to proceed with CT even with premedication.  Patient will keep scheduled office visit in December- per Dr. Ronne Binning, patient will need diagnostic ureteroscopy- Patient will have CT cancelled and return in December to discuss options.

## 2022-09-12 ENCOUNTER — Other Ambulatory Visit: Payer: BC Managed Care – PPO

## 2022-09-13 NOTE — Telephone Encounter (Signed)
Spoke to patient to make her aware.   She is still having symptoms, she is going to get someone to cover her sitter position so she is able to come by the office to give a urine.  Patient will call back and let the office know if she is coming today or tomorrow.  Will try to reach her again by the end of the day if I do not hear from her.

## 2022-09-14 ENCOUNTER — Other Ambulatory Visit: Payer: Self-pay | Admitting: Obstetrics and Gynecology

## 2022-09-14 ENCOUNTER — Other Ambulatory Visit: Payer: Self-pay

## 2022-09-14 DIAGNOSIS — M545 Low back pain, unspecified: Secondary | ICD-10-CM | POA: Diagnosis not present

## 2022-09-14 DIAGNOSIS — R109 Unspecified abdominal pain: Secondary | ICD-10-CM | POA: Diagnosis not present

## 2022-09-14 DIAGNOSIS — E559 Vitamin D deficiency, unspecified: Secondary | ICD-10-CM | POA: Diagnosis not present

## 2022-09-14 DIAGNOSIS — R399 Unspecified symptoms and signs involving the genitourinary system: Secondary | ICD-10-CM

## 2022-09-14 DIAGNOSIS — I10 Essential (primary) hypertension: Secondary | ICD-10-CM | POA: Diagnosis not present

## 2022-09-14 MED ORDER — NITROFURANTOIN MONOHYD MACRO 100 MG PO CAPS: 100.0000 mg | ORAL_CAPSULE | Freq: Two times a day (BID) | ORAL | 0 refills | Status: DC

## 2022-09-14 NOTE — Telephone Encounter (Signed)
Reached back out to patient.  She is working out of town.  She is aware Macrobid rx was sent to pharmacy per the PA.  Patient states she will drop a urine off tomorrow before 330p.

## 2022-09-15 ENCOUNTER — Other Ambulatory Visit: Payer: BC Managed Care – PPO

## 2022-09-16 ENCOUNTER — Ambulatory Visit: Payer: BC Managed Care – PPO | Admitting: Urology

## 2022-09-21 DIAGNOSIS — R350 Frequency of micturition: Secondary | ICD-10-CM | POA: Diagnosis not present

## 2022-09-21 DIAGNOSIS — R3121 Asymptomatic microscopic hematuria: Secondary | ICD-10-CM | POA: Diagnosis not present

## 2022-10-01 ENCOUNTER — Other Ambulatory Visit: Payer: BC Managed Care – PPO

## 2022-10-12 ENCOUNTER — Other Ambulatory Visit: Payer: BC Managed Care – PPO | Admitting: Urology

## 2022-10-18 ENCOUNTER — Ambulatory Visit (HOSPITAL_COMMUNITY): Payer: BC Managed Care – PPO

## 2022-10-19 ENCOUNTER — Ambulatory Visit: Payer: BC Managed Care – PPO | Admitting: Urology

## 2022-10-19 ENCOUNTER — Other Ambulatory Visit: Payer: Self-pay | Admitting: Adult Health

## 2022-10-28 ENCOUNTER — Ambulatory Visit: Payer: BC Managed Care – PPO

## 2022-11-03 ENCOUNTER — Ambulatory Visit (INDEPENDENT_AMBULATORY_CARE_PROVIDER_SITE_OTHER): Payer: BC Managed Care – PPO | Admitting: *Deleted

## 2022-11-03 DIAGNOSIS — Z3042 Encounter for surveillance of injectable contraceptive: Secondary | ICD-10-CM

## 2022-11-03 MED ORDER — MEDROXYPROGESTERONE ACETATE 150 MG/ML IM SUSP
150.0000 mg | Freq: Once | INTRAMUSCULAR | Status: AC
  Administered 2022-11-03: 150 mg via INTRAMUSCULAR

## 2022-11-03 NOTE — Progress Notes (Signed)
   NURSE VISIT- INJECTION  SUBJECTIVE:  Karla Ward is a 52 y.o. [phone] female here for a Depo Provera for contraception/period management. She is a GYN patient.   OBJECTIVE:  There were no vitals taken for this visit.  Appears well, in no apparent distress  Injection administered in: Left deltoid  Meds ordered this encounter  Medications   medroxyPROGESTERone (DEPO-PROVERA) injection 150 mg    ASSESSMENT: GYN patient Depo Provera for contraception/period management PLAN: Follow-up: in 11-13 weeks for next Depo   Levy Pupa  11/03/2022 4:12 PM

## 2022-11-04 DIAGNOSIS — R3121 Asymptomatic microscopic hematuria: Secondary | ICD-10-CM | POA: Diagnosis not present

## 2022-11-04 DIAGNOSIS — R82998 Other abnormal findings in urine: Secondary | ICD-10-CM | POA: Diagnosis not present

## 2022-12-20 ENCOUNTER — Encounter: Payer: Self-pay | Admitting: Gastroenterology

## 2022-12-28 DIAGNOSIS — R109 Unspecified abdominal pain: Secondary | ICD-10-CM | POA: Diagnosis not present

## 2022-12-28 DIAGNOSIS — M545 Low back pain, unspecified: Secondary | ICD-10-CM | POA: Diagnosis not present

## 2022-12-28 DIAGNOSIS — I10 Essential (primary) hypertension: Secondary | ICD-10-CM | POA: Diagnosis not present

## 2022-12-28 DIAGNOSIS — E559 Vitamin D deficiency, unspecified: Secondary | ICD-10-CM | POA: Diagnosis not present

## 2023-01-13 DIAGNOSIS — E669 Obesity, unspecified: Secondary | ICD-10-CM | POA: Diagnosis not present

## 2023-01-13 DIAGNOSIS — J069 Acute upper respiratory infection, unspecified: Secondary | ICD-10-CM | POA: Diagnosis not present

## 2023-01-13 DIAGNOSIS — Z6833 Body mass index (BMI) 33.0-33.9, adult: Secondary | ICD-10-CM | POA: Diagnosis not present

## 2023-01-22 ENCOUNTER — Other Ambulatory Visit: Payer: Self-pay | Admitting: Adult Health

## 2023-01-23 ENCOUNTER — Ambulatory Visit: Payer: BC Managed Care – PPO

## 2023-01-25 ENCOUNTER — Ambulatory Visit (INDEPENDENT_AMBULATORY_CARE_PROVIDER_SITE_OTHER): Payer: BC Managed Care – PPO | Admitting: *Deleted

## 2023-01-25 VITALS — BP 131/82 | HR 84

## 2023-01-25 DIAGNOSIS — Z3042 Encounter for surveillance of injectable contraceptive: Secondary | ICD-10-CM

## 2023-01-25 MED ORDER — MEDROXYPROGESTERONE ACETATE 150 MG/ML IM SUSP
150.0000 mg | Freq: Once | INTRAMUSCULAR | Status: AC
  Administered 2023-01-25: 150 mg via INTRAMUSCULAR

## 2023-01-25 NOTE — Progress Notes (Signed)
   NURSE VISIT- INJECTION  SUBJECTIVE:  Karla Ward is a 52 y.o. [phone] female here for a Depo Provera for contraception/period management. She is a GYN patient.   OBJECTIVE:  BP 131/82 (BP Location: Left Arm, Cuff Size: Normal)   Pulse 84   Appears well, in no apparent distress  Injection administered in: Right deltoid  Meds ordered this encounter  Medications   medroxyPROGESTERone (DEPO-PROVERA) injection 150 mg    ASSESSMENT: GYN patient Depo Provera for contraception/period management PLAN: Follow-up: in 11-13 weeks for next Depo   Alice Rieger  01/25/2023 9:50 AM

## 2023-03-01 NOTE — Progress Notes (Signed)
Cardiology Office Note:   Date:  03/03/2023  NAME:  Karla Ward    MRN: [id number] DOB:  06-01-1971   PCP:  Gareth Morgan, MD  Cardiologist:  None  Electrophysiologist:  None   Referring MD: Gareth Morgan, MD   Chief Complaint  Patient presents with   Follow-up        History of Present Illness:   Karla Ward is a 52 y.o. female with a hx of GERD who presents for follow-up.  She reports she is still having chest discomfort.  Describes tightness in her chest.  Occurs at night.  Can occur with laying down.  Slightly improved with acid reflux medication but still having this.  We did not pursue cardiac testing as he thought this is acid reflux related.  She is concerned about her heart.  Given that she is having ongoing symptoms I recommended a stress test.  She is still having palpitations.  Describes flutters in her chest.  Were infrequent.  Now more frequent.  Occurring 2 times per week.  Occur with laying down.  Described as a fluttering sensation.  Last seconds.  She is doing some exercise but not that much.  No chest pains or shortness of breath with activity.  Symptoms appear to be atypical but she is never had a stress evaluation.  Blood pressure well-controlled.  Has not had recent lipids.  Recent kidney function is normal.  No recent TSH.  CBC normal.  CV exam normal.  EKGs in the past have been normal.  Past Medical History: Past Medical History:  Diagnosis Date   Anemia    Dizziness    Gastritis    GERD (gastroesophageal reflux disease)    Hypertension    Iron deficiency    Meniere disease    Status post dilation of esophageal narrowing    Vertigo    Vitamin D deficiency     Past Surgical History: Past Surgical History:  Procedure Laterality Date   DILITATION & CURRETTAGE/HYSTROSCOPY WITH NOVASURE ABLATION N/A 04/05/2017   Procedure: DILATATION & CURETTAGE/HYSTEROSCOPY WITH NOVASURE ENDOMETRIAL ABLATION;  Surgeon: Lazaro Arms, MD;  Location:  [facility];  Service: Gynecology;  Laterality: N/A;   ESOPHAGOGASTRODUODENOSCOPY N/A 09/02/2015   WUJ:WJXB non-erosive gastritis/stricture at the gastroesophageal junction    NONE TO DATE  08/13/15   SAVORY DILATION  09/02/2015   Procedure: SAVORY DILATION;  Surgeon: West Bali, MD;  Location: [facility];  Service: Endoscopy;;    Current Medications: Current Meds  Medication Sig   Albuterol Sulfate, sensor, (PROAIR DIGIHALER) 108 (90 Base) MCG/ACT AEPB Inhale into the lungs as needed.   amLODipine (NORVASC) 5 MG tablet Take 5 mg by mouth daily.   Ascorbic Acid (VITAMIN C PO) Take by mouth.   clotrimazole-betamethasone (LOTRISONE) cream APPLY TO AFFECTED AREA TWICE A DAY   fluticasone (FLONASE) 50 MCG/ACT nasal spray Place 2 sprays into both nostrils as needed.   meclizine (ANTIVERT) 25 MG tablet Take 50 mg by mouth 2 (two) times daily as needed.   medroxyPROGESTERone (DEPO-PROVERA) 150 MG/ML injection INJECT 1 ML (150 MG TOTAL) INTO THE MUSCLE EVERY 3 (THREE) MONTHS   omeprazole (PRILOSEC) 20 MG capsule Take 20 mg by mouth daily.   ondansetron (ZOFRAN ODT) 4 MG disintegrating tablet Take 1 tablet (4 mg total) by mouth every 8 (eight) hours as needed for nausea or vomiting.   ondansetron (ZOFRAN) 4 MG tablet Take 4 mg by mouth as needed.   predniSONE (DELTASONE) 10 MG  tablet Take 10 mg by mouth as needed.   sucralfate (CARAFATE) 1 GM/10ML suspension Take 10 mLs (1 g total) by mouth 4 (four) times daily -  with meals and at bedtime.   VITAMIN D PO Take by mouth daily.     Allergies:    Benadryl [diphenhydramine hcl (sleep)], Ciprofloxacin, Ibuprofen, Penicillins, Vitamin d analogs, and Iodinated contrast media   Social History: Social History   Socioeconomic History   Marital status: Married    Spouse name: Not on file   Number of children: 2   Years of education: Not on file   Highest education level: Not on file  Occupational History   Not on file  Tobacco Use   Smoking  status: Never   Smokeless tobacco: Never  Vaping Use   Vaping Use: Never used  Substance and Sexual Activity   Alcohol use: No   Drug use: No   Sexual activity: Yes    Birth control/protection: Surgical, Injection    Comment: ablation  Other Topics Concern   Not on file  Social History Narrative   Lives with husband   Right handed   Caffeine- some    Social Determinants of Health   Financial Resource Strain: Low Risk  (07/06/2022)   Overall Financial Resource Strain (CARDIA)    Difficulty of Paying Living Expenses: Not hard at all  Food Insecurity: No Food Insecurity (07/06/2022)   Hunger Vital Sign    Worried About Running Out of Food in the Last Year: Never true    Ran Out of Food in the Last Year: Never true  Transportation Needs: No Transportation Needs (07/06/2022)   PRAPARE - Administrator, Civil Service (Medical): No    Lack of Transportation (Non-Medical): No  Physical Activity: Inactive (07/06/2022)   Exercise Vital Sign    Days of Exercise per Week: 0 days    Minutes of Exercise per Session: 0 min  Stress: No Stress Concern Present (07/06/2022)   [employer] of Occupational Health - Occupational Stress Questionnaire    Feeling of Stress : Only a little  Social Connections: Socially Integrated (07/06/2022)   Social Connection and Isolation Panel [NHANES]    Frequency of Communication with Friends and Family: More than three times a week    Frequency of Social Gatherings with Friends and Family: Twice a week    Attends Religious Services: More than 4 times per year    Active Member of [employer] or Organizations: Yes    Attends Engineer, structural: More than 4 times per year    Marital Status: Married     Family History: The patient's family history includes Congestive Heart Failure in her sister; Hypertension in her father and mother; Throat cancer (age of onset: 14) in her father. There is no history of Colon cancer or Colon polyps.  ROS:   All  other ROS reviewed and negative. Pertinent positives noted in the HPI.     EKGs/Labs/Other Studies Reviewed:   The following studies were personally reviewed by me today:  Recent Labs: 07/11/2022: ALT 11; BUN 12; Creatinine, Ser 1.08; Hemoglobin 14.1; Platelets 350; Potassium 3.5; Sodium 141   Recent Lipid Panel No results found for: "CHOL", "TRIG", "HDL", "CHOLHDL", "VLDL", "LDLCALC", "LDLDIRECT"  Physical Exam:   VS:  BP 138/80 (BP Location: Left Arm, Patient Position: Sitting, Cuff Size: Normal)   Pulse 75   Ht 5\' 4"  (1.626 m)   Wt 192 lb 3.2 oz (87.2 kg)   SpO2  99%   BMI 32.99 kg/m    Wt Readings from Last 3 Encounters:  03/03/23 192 lb 3.2 oz (87.2 kg)  07/11/22 203 lb 0.7 oz (92.1 kg)  07/06/22 203 lb (92.1 kg)    General: Well nourished, well developed, in no acute distress Head: Atraumatic, normal size  Eyes: PEERLA, EOMI  Neck: Supple, no JVD Endocrine: No thryomegaly Cardiac: Normal S1, S2; RRR; no murmurs, rubs, or gallops Lungs: Clear to auscultation bilaterally, no wheezing, rhonchi or rales  Abd: Soft, nontender, no hepatomegaly  Ext: No edema, pulses 2+ Musculoskeletal: No deformities, BUE and BLE strength normal and equal Skin: Warm and dry, no rashes   Neuro: Alert and oriented to person, place, time, and situation, CNII-XII grossly intact, no focal deficits  Psych: Normal mood and affect   ASSESSMENT:   Daziah Schoenborn is a 52 y.o. female who presents for the following: 1. Palpitations   2. Precordial pain     PLAN:   1. Palpitations -Continue symptoms of palpitations.  Sound benign.  We will proceed with 7-day ZIO to exclude arrhythmia.  CV exam normal.  EKGs have been largely normal in the past.  Will also check a TSH.  2. Precordial pain -Possibly cardiac.  Slightly improved with acid reflux medication but still occurring.  BMI 32.  Unable to exercise on a treadmill.  She reports she can only do light activity.  Her EKGs have been fairly  normal.  I have recommended cardiac PET stress test.  This is the better test for evaluation.  This will also include a chest CT without contrast.  She reports allergies to contrast.  We discussed premedication for coronary CTA but she would not like to do this.  She would like to avoid this.  Given obesity and inability to exercise on treadmill I believe cardiac PET is the best option for her.  She will see me back as needed based on the results of this test.  We will also check lipids for further risk stratification.   Shared Decision Making/Informed Consent The risks [chest pain, shortness of breath, cardiac arrhythmias, dizziness, blood pressure fluctuations, myocardial infarction, stroke/transient ischemic attack, nausea, vomiting, allergic reaction, radiation exposure, metallic taste sensation and life-threatening complications (estimated to be 1 in 10,000)], benefits (risk stratification, diagnosing coronary artery disease, treatment guidance) and alternatives of a cardiac PET stress test were discussed in detail with Karla Ward and she agrees to proceed.  Disposition: Return if symptoms worsen or fail to improve.  Medication Adjustments/Labs and Tests Ordered: Current medicines are reviewed at length with the patient today.  Concerns regarding medicines are outlined above.  Orders Placed This Encounter  Procedures   NM PET CT CARDIAC PERFUSION MULTI W/ABSOLUTE BLOODFLOW   TSH   Lipid Profile   Cardiac Stress Test: Informed Consent Details: Physician/Practitioner Attestation; Transcribe to consent form and obtain patient signature   LONG TERM MONITOR (3-14 DAYS)   No orders of the defined types were placed in this encounter.   Patient Instructions  Medication Instructions:  The current medical regimen is effective;  continue present plan and medications.  *If you need a refill on your cardiac medications before your next appointment, please call your pharmacy*   Lab Work: TSH  today   If you have labs (blood work) drawn today and your tests are completely normal, you will receive your results only by: MyChart Message (if you have MyChart) OR A paper copy in the mail If you have any lab  test that is abnormal or we need to change your treatment, we will call you to review the results.   Testing/Procedures:  CARDIAC PET- Your physician has requested that you have a Cardiac Pet Stress Test. This testing is completed at [facility] ([address] [place], [place] [address]). The schedulers will call you to get this scheduled. Please follow instructions below and call the office with any questions/concerns [phone]).   ZIO XT- Long Term Monitor Instructions  Your physician has requested you wear a ZIO patch monitor for 7 days.  This is a single patch monitor. [employer] supplies one patch monitor per enrollment. Additional stickers are not available. Please do not apply patch if you will be having a Nuclear Stress Test,  Echocardiogram, Cardiac CT, MRI, or Chest Xray during the period you would be wearing the  monitor. The patch cannot be worn during these tests. You cannot remove and re-apply the  ZIO XT patch monitor.  Your ZIO patch monitor will be mailed 3 day USPS to your address on file. It may take 3-5 days  to receive your monitor after you have been enrolled.  Once you have received your monitor, please review the enclosed instructions. Your monitor  has already been registered assigning a specific monitor serial # to you.  Billing and Patient Assistance Program Information  We have supplied [employer] with any of your insurance information on file for billing purposes. [employer] offers a sliding scale Patient Assistance Program for patients that do not have  insurance, or whose insurance does not completely cover the cost of the ZIO monitor.  You must apply for the Patient Assistance Program to qualify for this discounted rate.  To  apply, please call [employer] at [phone], select option 4, select option 2, ask to apply for  Patient Assistance Program. Meredeth Ide will ask your household income, and how many people  are in your household. They will quote your out-of-pocket cost based on that information.  [employer] will also be able to set up a 32-month, interest-free payment plan if needed.  Applying the monitor   Shave hair from upper left chest.  Hold abrader disc by orange tab. Rub abrader in 40 strokes over the upper left chest as  indicated in your monitor instructions.  Clean area with 4 enclosed alcohol pads. Let dry.  Apply patch as indicated in monitor instructions. Patch will be placed under collarbone on left  side of chest with arrow pointing upward.  Rub patch adhesive wings for 2 minutes. Remove white label marked "1". Remove the white  label marked "2". Rub patch adhesive wings for 2 additional minutes.  While looking in a mirror, press and release button in center of patch. A small green light will  flash 3-4 times. This will be your only indicator that the monitor has been turned on.  Do not shower for the first 24 hours. You may shower after the first 24 hours.  Press the button if you feel a symptom. You will hear a small click. Record Date, Time and  Symptom in the Patient Logbook.  When you are ready to remove the patch, follow instructions on the last 2 pages of Patient  Logbook. Stick patch monitor onto the last page of Patient Logbook.  Place Patient Logbook in the blue and white box. Use locking tab on box and tape box closed  securely. The blue and white box has prepaid postage on it. Please place it in the mailbox as  soon as possible. Your physician should have your test results approximately 7 days after the  monitor has been mailed back to [facility].  Call [facility] Customer Care at [phone] if you have questions regarding  your ZIO XT patch monitor. Call them immediately if  you see an orange light blinking on your  monitor.  If your monitor falls off in less than 4 days, contact our Monitor department at [phone].  If your monitor becomes loose or falls off after 4 days call [employer] at [phone] for  suggestions on securing your monitor    Follow-Up: At [facility], you and your health needs are our priority.  As part of our continuing mission to provide you with exceptional heart care, we have created designated Provider Care Teams.  These Care Teams include your primary Cardiologist (physician) and Advanced Practice Providers (APPs -  Physician Assistants and Nurse Practitioners) who all work together to provide you with the care you need, when you need it.  We recommend signing up for the patient portal called "MyChart".  Sign up information is provided on this After Visit Summary.  MyChart is used to connect with patients for Virtual Visits (Telemedicine).  Patients are able to view lab/test results, encounter notes, upcoming appointments, etc.  Non-urgent messages can be sent to your provider as well.   To learn more about what you can do with MyChart, go to [email or web address].    Your next appointment:   As needed  Provider:   Lennie Odor, MD   Other Instructions How to Prepare for Your Cardiac PET/CT Stress Test:  1. Please do not take these medications before your test:   Medications that may interfere with the cardiac pharmacological stress agent (ex. nitrates - including erectile dysfunction medications, isosorbide mononitrate, tamulosin or beta-blockers) the day of the exam. (Erectile dysfunction medication should be held for at least 72 hrs prior to test) Theophylline containing medications for 12 hours. Dipyridamole 48 hours prior to the test. Your remaining medications may be taken with water.  2. Nothing to eat or drink, except water, 3 hours prior to arrival time.   NO caffeine/decaffeinated products, or chocolate  12 hours prior to arrival.  3. NO perfume, cologne or lotion  4. Total time is 1 to 2 hours; you may want to bring reading material for the waiting time.  5. Please report to Radiology at the [facility] Main Entrance 30 minutes early for your test.  [address] [place], [place] [address]  Diabetic Preparation:  Hold oral medications. You may take NPH and Lantus insulin. Do not take Humalog or Humulin R (Regular Insulin) the day of your test. Check blood sugars prior to leaving the house. If able to eat breakfast prior to 3 hour fasting, you may take all medications, including your insulin, Do not worry if you miss your breakfast dose of insulin - start at your next meal.  IF YOU THINK YOU MAY BE PREGNANT, OR ARE NURSING PLEASE INFORM THE TECHNOLOGIST.  In preparation for your appointment, medication and supplies will be purchased.  Appointment availability is limited, so if you need to cancel or reschedule, please call the Radiology Department at [phone]  24 hours in advance to avoid a cancellation fee of $100.00  What to Expect After you Arrive:  Once you arrive and check in for your appointment, you will be taken to a preparation room within the Radiology Department.  A technologist or Nurse will obtain your medical  history, verify that you are correctly prepped for the exam, and explain the procedure.  Afterwards,  an IV will be started in your arm and electrodes will be placed on your skin for EKG monitoring during the stress portion of the exam. Then you will be escorted to the PET/CT scanner.  There, staff will get you positioned on the scanner and obtain a blood pressure and EKG.  During the exam, you will continue to be connected to the EKG and blood pressure machines.  A small, safe amount of a radioactive tracer will be injected in your IV to obtain a series of pictures of your heart along with an injection of a stress agent.    After your Exam:  It is  recommended that you eat a meal and drink a caffeinated beverage to counter act any effects of the stress agent.  Drink plenty of fluids for the remainder of the day and urinate frequently for the first couple of hours after the exam.  Your doctor will inform you of your test results within 7-10 business days.  For questions about your test or how to prepare for your test, please call: Rockwell Alexandria, Cardiac Imaging Nurse Navigator  Larey Brick, Cardiac Imaging Nurse Navigator Office: [phone]     Time Spent with Patient: I have spent a total of 35 minutes with patient reviewing hospital notes, telemetry, EKGs, labs and examining the patient as well as establishing an assessment and plan that was discussed with the patient.  > 50% of time was spent in direct patient care.  Signed, Lenna Gilford. Flora Lipps, MD, [facility]  [facility]  [address], [address] [place], [place] [address] [phone]  03/03/2023 9:10 AM

## 2023-03-03 ENCOUNTER — Encounter: Payer: Self-pay | Admitting: Cardiovascular Disease

## 2023-03-03 ENCOUNTER — Ambulatory Visit: Payer: BC Managed Care – PPO | Attending: Cardiovascular Disease | Admitting: Cardiovascular Disease

## 2023-03-03 VITALS — BP 138/80 | HR 75 | Ht 64.0 in | Wt 192.2 lb

## 2023-03-03 DIAGNOSIS — R072 Precordial pain: Secondary | ICD-10-CM

## 2023-03-03 DIAGNOSIS — R002 Palpitations: Secondary | ICD-10-CM | POA: Diagnosis not present

## 2023-03-03 DIAGNOSIS — K219 Gastro-esophageal reflux disease without esophagitis: Secondary | ICD-10-CM

## 2023-03-03 LAB — TSH: TSH: 1.75 u[IU]/mL (ref 0.450–4.500)

## 2023-03-03 NOTE — Patient Instructions (Signed)
Medication Instructions:  The current medical regimen is effective;  continue present plan and medications.  *If you need a refill on your cardiac medications before your next appointment, please call your pharmacy*   Lab Work: TSH today   If you have labs (blood work) drawn today and your tests are completely normal, you will receive your results only by: MyChart Message (if you have MyChart) OR A paper copy in the mail If you have any lab test that is abnormal or we need to change your treatment, we will call you to review the results.   Testing/Procedures:  CARDIAC PET- Your physician has requested that you have a Cardiac Pet Stress Test. This testing is completed at [facility] ([address] [place], [place] [address]). The schedulers will call you to get this scheduled. Please follow instructions below and call the office with any questions/concerns [phone]).   ZIO XT- Long Term Monitor Instructions  Your physician has requested you wear a ZIO patch monitor for 7 days.  This is a single patch monitor. [employer] supplies one patch monitor per enrollment. Additional stickers are not available. Please do not apply patch if you will be having a Nuclear Stress Test,  Echocardiogram, Cardiac CT, MRI, or Chest Xray during the period you would be wearing the  monitor. The patch cannot be worn during these tests. You cannot remove and re-apply the  ZIO XT patch monitor.  Your ZIO patch monitor will be mailed 3 day USPS to your address on file. It may take 3-5 days  to receive your monitor after you have been enrolled.  Once you have received your monitor, please review the enclosed instructions. Your monitor  has already been registered assigning a specific monitor serial # to you.  Billing and Patient Assistance Program Information  We have supplied [employer] with any of your insurance information on file for billing purposes. [employer] offers a sliding scale  Patient Assistance Program for patients that do not have  insurance, or whose insurance does not completely cover the cost of the ZIO monitor.  You must apply for the Patient Assistance Program to qualify for this discounted rate.  To apply, please call [employer] at [phone], select option 4, select option 2, ask to apply for  Patient Assistance Program. Meredeth Ide will ask your household income, and how many people  are in your household. They will quote your out-of-pocket cost based on that information.  [employer] will also be able to set up a 54-month, interest-free payment plan if needed.  Applying the monitor   Shave hair from upper left chest.  Hold abrader disc by orange tab. Rub abrader in 40 strokes over the upper left chest as  indicated in your monitor instructions.  Clean area with 4 enclosed alcohol pads. Let dry.  Apply patch as indicated in monitor instructions. Patch will be placed under collarbone on left  side of chest with arrow pointing upward.  Rub patch adhesive wings for 2 minutes. Remove white label marked "1". Remove the white  label marked "2". Rub patch adhesive wings for 2 additional minutes.  While looking in a mirror, press and release button in center of patch. A small green light will  flash 3-4 times. This will be your only indicator that the monitor has been turned on.  Do not shower for the first 24 hours. You may shower after the first 24 hours.  Press the button if you feel a symptom. You will hear a small  click. Record Date, Time and  Symptom in the Patient Logbook.  When you are ready to remove the patch, follow instructions on the last 2 pages of Patient  Logbook. Stick patch monitor onto the last page of Patient Logbook.  Place Patient Logbook in the blue and white box. Use locking tab on box and tape box closed  securely. The blue and white box has prepaid postage on it. Please place it in the mailbox as  soon as possible. Your physician should have  your test results approximately 7 days after the  monitor has been mailed back to [facility].  Call [facility] Customer Care at [phone] if you have questions regarding  your ZIO XT patch monitor. Call them immediately if you see an orange light blinking on your  monitor.  If your monitor falls off in less than 4 days, contact our Monitor department at [phone].  If your monitor becomes loose or falls off after 4 days call [employer] at [phone] for  suggestions on securing your monitor    Follow-Up: At [facility], you and your health needs are our priority.  As part of our continuing mission to provide you with exceptional heart care, we have created designated Provider Care Teams.  These Care Teams include your primary Cardiologist (physician) and Advanced Practice Providers (APPs -  Physician Assistants and Nurse Practitioners) who all work together to provide you with the care you need, when you need it.  We recommend signing up for the patient portal called "MyChart".  Sign up information is provided on this After Visit Summary.  MyChart is used to connect with patients for Virtual Visits (Telemedicine).  Patients are able to view lab/test results, encounter notes, upcoming appointments, etc.  Non-urgent messages can be sent to your provider as well.   To learn more about what you can do with MyChart, go to [email or web address].    Your next appointment:   As needed  Provider:   Lennie Odor, MD   Other Instructions How to Prepare for Your Cardiac PET/CT Stress Test:  1. Please do not take these medications before your test:   Medications that may interfere with the cardiac pharmacological stress agent (ex. nitrates - including erectile dysfunction medications, isosorbide mononitrate, tamulosin or beta-blockers) the day of the exam. (Erectile dysfunction medication should be held for at least 72 hrs prior to test) Theophylline containing  medications for 12 hours. Dipyridamole 48 hours prior to the test. Your remaining medications may be taken with water.  2. Nothing to eat or drink, except water, 3 hours prior to arrival time.   NO caffeine/decaffeinated products, or chocolate 12 hours prior to arrival.  3. NO perfume, cologne or lotion  4. Total time is 1 to 2 hours; you may want to bring reading material for the waiting time.  5. Please report to Radiology at the [facility] Main Entrance 30 minutes early for your test.  [address] [place], [place] [address]  Diabetic Preparation:  Hold oral medications. You may take NPH and Lantus insulin. Do not take Humalog or Humulin R (Regular Insulin) the day of your test. Check blood sugars prior to leaving the house. If able to eat breakfast prior to 3 hour fasting, you may take all medications, including your insulin, Do not worry if you miss your breakfast dose of insulin - start at your next meal.  IF YOU THINK YOU MAY BE PREGNANT, OR ARE NURSING PLEASE INFORM THE TECHNOLOGIST.  In  preparation for your appointment, medication and supplies will be purchased.  Appointment availability is limited, so if you need to cancel or reschedule, please call the Radiology Department at [phone]  24 hours in advance to avoid a cancellation fee of $100.00  What to Expect After you Arrive:  Once you arrive and check in for your appointment, you will be taken to a preparation room within the Radiology Department.  A technologist or Nurse will obtain your medical history, verify that you are correctly prepped for the exam, and explain the procedure.  Afterwards,  an IV will be started in your arm and electrodes will be placed on your skin for EKG monitoring during the stress portion of the exam. Then you will be escorted to the PET/CT scanner.  There, staff will get you positioned on the scanner and obtain a blood pressure and EKG.  During the exam, you will continue to be  connected to the EKG and blood pressure machines.  A small, safe amount of a radioactive tracer will be injected in your IV to obtain a series of pictures of your heart along with an injection of a stress agent.    After your Exam:  It is recommended that you eat a meal and drink a caffeinated beverage to counter act any effects of the stress agent.  Drink plenty of fluids for the remainder of the day and urinate frequently for the first couple of hours after the exam.  Your doctor will inform you of your test results within 7-10 business days.  For questions about your test or how to prepare for your test, please call: Rockwell Alexandria, Cardiac Imaging Nurse Navigator  Larey Brick, Cardiac Imaging Nurse Navigator Office: [phone]

## 2023-03-06 ENCOUNTER — Ambulatory Visit: Payer: BC Managed Care – PPO | Attending: Cardiovascular Disease

## 2023-03-06 DIAGNOSIS — R002 Palpitations: Secondary | ICD-10-CM

## 2023-03-06 NOTE — Progress Notes (Unsigned)
Enrolled for [employer] to mail a ZIO XT long term holter monitor to the patients address on file.  

## 2023-03-08 DIAGNOSIS — R3121 Asymptomatic microscopic hematuria: Secondary | ICD-10-CM | POA: Diagnosis not present

## 2023-03-21 ENCOUNTER — Other Ambulatory Visit: Payer: Self-pay | Admitting: Adult Health

## 2023-03-21 ENCOUNTER — Other Ambulatory Visit: Payer: Self-pay | Admitting: Internal Medicine

## 2023-03-21 MED ORDER — OMEPRAZOLE 20 MG PO CPDR: 20.0000 mg | DELAYED_RELEASE_CAPSULE | Freq: Two times a day (BID) | ORAL | 3 refills | Status: DC

## 2023-03-21 NOTE — Addendum Note (Signed)
Addended by: Gelene Mink on: 03/21/2023 03:08 PM   Modules accepted: Orders

## 2023-03-22 ENCOUNTER — Encounter: Payer: Self-pay | Admitting: Gastroenterology

## 2023-03-22 ENCOUNTER — Ambulatory Visit (INDEPENDENT_AMBULATORY_CARE_PROVIDER_SITE_OTHER): Payer: BC Managed Care – PPO | Admitting: Gastroenterology

## 2023-03-22 VITALS — BP 127/74 | HR 87 | Temp 97.5°F | Ht 64.0 in | Wt 193.5 lb

## 2023-03-22 DIAGNOSIS — R1013 Epigastric pain: Secondary | ICD-10-CM | POA: Diagnosis not present

## 2023-03-22 MED ORDER — SUCRALFATE 1 GM/10ML PO SUSP: 1.0000 g | Freq: Three times a day (TID) | ORAL | 0 refills | Status: AC

## 2023-03-22 MED ORDER — PANTOPRAZOLE SODIUM 40 MG PO TBEC: 40.0000 mg | DELAYED_RELEASE_TABLET | Freq: Every day | ORAL | 3 refills | Status: DC

## 2023-03-22 NOTE — Patient Instructions (Signed)
Let's stop omeprazole. Instead, start pantoprazole once each morning, 30 minutes before breakfast.  I sent in the liquid carafate suspension to see if covered better if less amount. If not, you can use the tablets and take four times a day before eating.  Message me in 2 weeks with an update! If not improved, we can do an upper endoscopy.  Will see you in 6 weeks!  I enjoyed seeing you again today! I value our relationship and want to provide genuine, compassionate, and quality care. You may receive a survey regarding your visit with me, and I welcome your feedback! Thanks so much for taking the time to complete this. I look forward to seeing you again.      Gelene Mink, PhD, ANP-BC [facility] Gastroenterology

## 2023-03-22 NOTE — Progress Notes (Signed)
Gastroenterology Office Note     Primary Care Physician:  Gareth Morgan, MD  Primary Gastroenterologist: Dr. Marletta Lor    Chief Complaint   Chief Complaint  Patient presents with   Abdominal Pain    Patient here today due to mid epigastric pain on going for the last week. Patient says she has also had issues with heartburn. She is taking Omeprazole 20 mg once every am, She says she also took a carafate tablet which helped some.     History of Present Illness   Karla Ward is a 52 y.o. female presenting today in follow-up with a history of  chronic GERD, stricture s/p dilation in 2016, intermittent constipation, no prior colonoscopy as she desires to do Cologuard. Here due to epigastric pain. Last seen Sept 2023.   She will be doing Cologuard in the future. Notes epigastric pain starting after  Mother's Day when she ate chicken, potato salad, and mac n cheese. At first was lower abdomen but then settled in epigastric area. Intermittent. Not worsened with eating. Took carafate tablet with some improvement. Not in pain today. Has been on omeprazole 20 mg chronically. No dysphagia. No melena. No N/V. Good appetite. No changes in bowel habits. No NSAIDs.        Past Medical History:  Diagnosis Date   Anemia    Dizziness    Gastritis    GERD (gastroesophageal reflux disease)    Hypertension    Iron deficiency    Meniere disease    Status post dilation of esophageal narrowing    Vertigo    Vitamin D deficiency     Past Surgical History:  Procedure Laterality Date   DILITATION & CURRETTAGE/HYSTROSCOPY WITH NOVASURE ABLATION N/A 04/05/2017   Procedure: DILATATION & CURETTAGE/HYSTEROSCOPY WITH NOVASURE ENDOMETRIAL ABLATION;  Surgeon: Lazaro Arms, MD;  Location: [facility] ORS;  Service: Gynecology;  Laterality: N/A;   ESOPHAGOGASTRODUODENOSCOPY N/A 09/02/2015   ZOX:WRUE non-erosive gastritis/stricture at the gastroesophageal junction    NONE TO DATE  08/13/15   SAVORY  DILATION  09/02/2015   Procedure: SAVORY DILATION;  Surgeon: West Bali, MD;  Location: [facility] END[facility] SUITE;  Service: Endoscopy;;    Current Outpatient Medications  Medication Sig Dispense Refill   Albuterol Sulfate, sensor, (PROAIR DIGIHALER) 108 (90 Base) MCG/ACT AEPB Inhale into the lungs as needed.     amLODipine (NORVASC) 5 MG tablet Take 5 mg by mouth daily.     Ascorbic Acid (VITAMIN C PO) Take by mouth daily.     clotrimazole-betamethasone (LOTRISONE) cream APPLY TO AFFECTED AREA TWICE A DAY 30 g 2   fluticasone (FLONASE) 50 MCG/ACT nasal spray Place 2 sprays into both nostrils as needed.     meclizine (ANTIVERT) 25 MG tablet Take 50 mg by mouth 2 (two) times daily as needed.     medroxyPROGESTERone (DEPO-PROVERA) 150 MG/ML injection INJECT 1 ML (150 MG TOTAL) INTO THE MUSCLE EVERY 3 (THREE) MONTHS 1 mL 3   omeprazole (PRILOSEC) 20 MG capsule Take 1 capsule (20 mg total) by mouth 2 (two) times daily before a meal. 180 capsule 3   ondansetron (ZOFRAN) 4 MG tablet Take 4 mg by mouth as needed.     sucralfate (CARAFATE) 1 GM/10ML suspension Take 10 mLs (1 g total) by mouth 4 (four) times daily -  with meals and at bedtime. 1200 mL 11   VITAMIN D PO Take by mouth daily.     No current facility-administered medications for this visit.  Allergies as of 03/22/2023 - Review Complete 03/22/2023  Allergen Reaction Noted   Benadryl [diphenhydramine hcl (sleep)]  12/26/2014   Ciprofloxacin  04/29/2021   Ibuprofen  04/29/2021   Penicillins Hives 11/19/2012   Vitamin d analogs Other (See Comments) 04/29/2021   Iodinated contrast media Rash 09/09/2022    Family History  Problem Relation Age of Onset   Throat cancer Father 43   Hypertension Father    Hypertension Mother    Congestive Heart Failure Sister    Colon cancer Neg Hx    Colon polyps Neg Hx     Social History   Socioeconomic History   Marital status: Married    Spouse name: Not on file   Number of children: 2   Years  of education: Not on file   Highest education level: Not on file  Occupational History   Not on file  Tobacco Use   Smoking status: Never   Smokeless tobacco: Never  Vaping Use   Vaping Use: Never used  Substance and Sexual Activity   Alcohol use: No   Drug use: No   Sexual activity: Yes    Birth control/protection: Surgical, Injection    Comment: ablation  Other Topics Concern   Not on file  Social History Narrative   Lives with husband   Right handed   Caffeine- some    Social Determinants of Health   Financial Resource Strain: Low Risk  (07/06/2022)   Overall Financial Resource Strain (CARDIA)    Difficulty of Paying Living Expenses: Not hard at all  Food Insecurity: No Food Insecurity (07/06/2022)   Hunger Vital Sign    Worried About Running Out of Food in the Last Year: Never true    Ran Out of Food in the Last Year: Never true  Transportation Needs: No Transportation Needs (07/06/2022)   PRAPARE - Administrator, Civil Service (Medical): No    Lack of Transportation (Non-Medical): No  Physical Activity: Inactive (07/06/2022)   Exercise Vital Sign    Days of Exercise per Week: 0 days    Minutes of Exercise per Session: 0 min  Stress: No Stress Concern Present (07/06/2022)   [employer] of Occupational Health - Occupational Stress Questionnaire    Feeling of Stress : Only a little  Social Connections: Socially Integrated (07/06/2022)   Social Connection and Isolation Panel [NHANES]    Frequency of Communication with Friends and Family: More than three times a week    Frequency of Social Gatherings with Friends and Family: Twice a week    Attends Religious Services: More than 4 times per year    Active Member of [employer] or Organizations: Yes    Attends Engineer, structural: More than 4 times per year    Marital Status: Married  Catering manager Violence: Not At Risk (07/06/2022)   Humiliation, Afraid, Rape, and Kick questionnaire    Fear of Current or  Ex-Partner: No    Emotionally Abused: No    Physically Abused: No    Sexually Abused: No     Review of Systems   Gen: Denies any fever, chills, fatigue, weight loss, lack of appetite.  CV: Denies chest pain, heart palpitations, peripheral edema, syncope.  Resp: Denies shortness of breath at rest or with exertion. Denies wheezing or cough.  GI: Denies dysphagia or odynophagia. Denies jaundice, hematemesis, fecal incontinence. GU : Denies urinary burning, urinary frequency, urinary hesitancy MS: Denies joint pain, muscle weakness, cramps, or limitation of movement.  Derm: Denies rash, itching, dry skin Psych: Denies depression, anxiety, memory loss, and confusion Heme: Denies bruising, bleeding, and enlarged lymph nodes.   Physical Exam   BP 127/74 (BP Location: Left Arm, Patient Position: Sitting, Cuff Size: Large)   Pulse 87   Temp (!) 97.5 F (36.4 C) (Temporal)   Ht 5\' 4"  (1.626 m)   Wt 193 lb 8 oz (87.8 kg)   BMI 33.21 kg/m  General:   Alert and oriented. Pleasant and cooperative. Well-nourished and well-developed.  Head:  Normocephalic and atraumatic. Eyes:  Without icterus Abdomen:  +BS, soft, non-tender and non-distended. No HSM noted. No guarding or rebound. No masses appreciated.  Rectal:  Deferred  Msk:  Symmetrical without gross deformities. Normal posture. Extremities:  Without edema. Neurologic:  Alert and  oriented x4;  grossly normal neurologically. Skin:  Intact without significant lesions or rashes. Psych:  Alert and cooperative. Normal mood and affect.   Assessment   Karla Ward is a 52 y.o. female presenting today due to new onset epigastric pain. History significant for chronic GERD, stricture s/p dilation, intermittent constipation.  Dyspepsia onset on Mother's Day after eating; she has no alarm signs/symptoms. Known gastritis in past on EGD in 2016. Improvement noted with Carafate. As she has been on omeprazole long-term, will change to  pantoprazole. Brief course of carafate suspension as well. Does not appear to be biliary in etiology. If no improvement in next 2 weeks, can consider updated EGD.   Colon cancer screening: declining colonoscopy. She will be doing Cologuard through her PCP.    PLAN    Change from omeprazole to pantoprazole Brief course of carafate Message in 2 weeks Return in 6 weeks EGD if no improvement Cologuard through PCP   Gelene Mink, PhD, ANP-BC [facility] Gastroenterology

## 2023-03-23 DIAGNOSIS — R030 Elevated blood-pressure reading, without diagnosis of hypertension: Secondary | ICD-10-CM | POA: Diagnosis not present

## 2023-03-23 DIAGNOSIS — J069 Acute upper respiratory infection, unspecified: Secondary | ICD-10-CM | POA: Diagnosis not present

## 2023-03-23 DIAGNOSIS — R051 Acute cough: Secondary | ICD-10-CM | POA: Diagnosis not present

## 2023-03-23 DIAGNOSIS — Z6834 Body mass index (BMI) 34.0-34.9, adult: Secondary | ICD-10-CM | POA: Diagnosis not present

## 2023-03-24 DIAGNOSIS — R509 Fever, unspecified: Secondary | ICD-10-CM | POA: Diagnosis not present

## 2023-03-24 DIAGNOSIS — J209 Acute bronchitis, unspecified: Secondary | ICD-10-CM | POA: Diagnosis not present

## 2023-03-24 DIAGNOSIS — J020 Streptococcal pharyngitis: Secondary | ICD-10-CM | POA: Diagnosis not present

## 2023-03-24 DIAGNOSIS — R051 Acute cough: Secondary | ICD-10-CM | POA: Diagnosis not present

## 2023-03-27 ENCOUNTER — Encounter (HOSPITAL_COMMUNITY): Payer: Self-pay

## 2023-03-27 ENCOUNTER — Emergency Department (HOSPITAL_COMMUNITY): Payer: BC Managed Care – PPO

## 2023-03-27 ENCOUNTER — Emergency Department (HOSPITAL_COMMUNITY)
Admission: EM | Admit: 2023-03-27 | Discharge: 2023-03-27 | Disposition: A | Discharge status: Home / Self Care | Payer: BC Managed Care – PPO | Attending: Emergency Medicine | Admitting: Emergency Medicine

## 2023-03-27 ENCOUNTER — Other Ambulatory Visit: Payer: Self-pay

## 2023-03-27 DIAGNOSIS — R059 Cough, unspecified: Secondary | ICD-10-CM | POA: Diagnosis not present

## 2023-03-27 DIAGNOSIS — R0789 Other chest pain: Secondary | ICD-10-CM | POA: Diagnosis not present

## 2023-03-27 DIAGNOSIS — J209 Acute bronchitis, unspecified: Secondary | ICD-10-CM | POA: Insufficient documentation

## 2023-03-27 LAB — CBC
HCT: 42.3 % (ref 36.0–46.0)
Hemoglobin: 14.1 g/dL (ref 12.0–15.0)
MCH: 29.1 pg (ref 26.0–34.0)
MCHC: 33.3 g/dL (ref 30.0–36.0)
MCV: 87.2 fL (ref 80.0–100.0)
Platelets: 258 10*3/uL (ref 150–400)
RBC: 4.85 MIL/uL (ref 3.87–5.11)
RDW: 13.8 % (ref 11.5–15.5)
WBC: 6.9 10*3/uL (ref 4.0–10.5)
nRBC: 0 % (ref 0.0–0.2)

## 2023-03-27 LAB — BASIC METABOLIC PANEL
Anion gap: 15 (ref 5–15)
BUN: 7 mg/dL (ref 6–20)
CO2: 18 mmol/L — ABNORMAL LOW (ref 22–32)
Calcium: 8.9 mg/dL (ref 8.9–10.3)
Chloride: 105 mmol/L (ref 98–111)
Creatinine, Ser: 0.92 mg/dL (ref 0.44–1.00)
GFR, Estimated: >60 mL/min (ref >60)
Glucose, Bld: 104 mg/dL — ABNORMAL HIGH (ref 70–99)
Potassium: 3.4 mmol/L — ABNORMAL LOW (ref 3.5–5.1)
Sodium: 138 mmol/L (ref 135–145)

## 2023-03-27 LAB — TROPONIN I (HIGH SENSITIVITY)
Troponin I (High Sensitivity): 3 ng/L (ref <18)
Troponin I (High Sensitivity): 3 ng/L (ref <18)

## 2023-03-27 LAB — LIPASE, BLOOD: Lipase: 42 U/L (ref 11–51)

## 2023-03-27 LAB — D-DIMER, QUANTITATIVE: D-Dimer, Quant: 0.74 ug/mL-FEU — ABNORMAL HIGH (ref 0.00–0.50)

## 2023-03-27 MED ORDER — ALBUTEROL SULFATE HFA 108 (90 BASE) MCG/ACT IN AERS
2.0000 | INHALATION_SPRAY | RESPIRATORY_TRACT | Status: DC | PRN
  Administered 2023-03-27: 2 via RESPIRATORY_TRACT
  Filled 2023-03-27: qty 6.7

## 2023-03-27 MED ORDER — DOXYCYCLINE HYCLATE 100 MG PO CAPS: 100.0000 mg | ORAL_CAPSULE | Freq: Two times a day (BID) | ORAL | 0 refills | Status: DC

## 2023-03-27 MED ORDER — PREDNISONE 20 MG PO TABS: 40.0000 mg | ORAL_TABLET | Freq: Every day | ORAL | 0 refills | Status: DC

## 2023-03-27 NOTE — ED Triage Notes (Signed)
Pt arrived from home via POV c/o chest/rib pain that began this afternoon after coughing. Pt states that it hurst to take a deep breath. Pain is 7/10

## 2023-03-27 NOTE — ED Provider Notes (Signed)
[place] EMERGENCY DEPARTMENT AT [facility] Provider Note   CSN: [id number] Arrival date & time: 03/27/23  0148     History  Chief Complaint  Patient presents with   Chest Pain    Karla Ward is a 52 y.o. female.  Patient presents to the emergency department for evaluation of chest congestion, cough, bilateral rib pain.  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  She was negative for flu and COVID.       Home Medications Prior to Admission medications   Medication Sig Start Date End Date Taking? Authorizing Provider  doxycycline (VIBRAMYCIN) 100 MG capsule Take 1 capsule (100 mg total) by mouth 2 (two) times daily. 03/27/23  Yes Icy Fuhrmann, Canary Brim, MD  predniSONE (DELTASONE) 20 MG tablet Take 2 tablets (40 mg total) by mouth daily with breakfast. 03/27/23  Yes Suann Klier, Canary Brim, MD  Albuterol Sulfate, sensor, (PROAIR DIGIHALER) 108 (90 Base) MCG/ACT AEPB Inhale into the lungs as needed.    [provider]  amLODipine (NORVASC) 5 MG tablet Take 5 mg by mouth daily.    [provider]  Ascorbic Acid (VITAMIN C PO) Take by mouth daily.    [provider]  clotrimazole-betamethasone (LOTRISONE) cream APPLY TO AFFECTED AREA TWICE A DAY 01/23/23   Cyril Mourning A, NP  fluticasone (FLONASE) 50 MCG/ACT nasal spray Place 2 sprays into both nostrils as needed. 12/08/22   [provider]  meclizine (ANTIVERT) 25 MG tablet Take 50 mg by mouth 2 (two) times daily as needed. 03/09/21   [provider]  medroxyPROGESTERone (DEPO-PROVERA) 150 MG/ML injection INJECT 1 ML (150 MG TOTAL) INTO THE MUSCLE EVERY 3 (THREE) MONTHS 03/21/23   Adline Potter, NP  omeprazole (PRILOSEC) 20 MG capsule Take 1 capsule (20 mg total) by mouth 2 (two) times daily before a meal. 03/21/23   Gelene Mink, NP  ondansetron (ZOFRAN) 4 MG tablet Take 4 mg by mouth as needed. 02/27/23   [provider]  pantoprazole (PROTONIX) 40 MG tablet Take 1 tablet (40 mg total) by mouth daily. 30 minutes before eating. 03/22/23   Gelene Mink, NP  sucralfate (CARAFATE) 1 GM/10ML suspension Take 10 mLs (1 g total) by mouth 4 (four) times daily -  with meals and at bedtime for 14 days. 03/22/23 04/05/23  Gelene Mink, NP  VITAMIN D PO Take by mouth daily.    [provider]      Allergies    Benadryl [diphenhydramine hcl (sleep)], Ciprofloxacin, Ibuprofen, Penicillins, Vitamin d analogs, and Iodinated contrast media    Review of Systems   Review of Systems  Physical Exam Updated Vital Signs BP (!) 143/91 (BP Location: Left Arm)   Pulse 84   Temp 98.5 F (36.9 C) (Oral)   Resp 20   Ht 5\' 4"  (1.626 m)   Wt 88.5 kg   LMP  (LMP Unknown) Comment: eblation and depo shot  SpO2 100%   BMI 33.47 kg/m  Physical Exam Vitals and nursing note reviewed.  Constitutional:      General: She is not in acute distress.    Appearance: She is well-developed.  HENT:     Head: Normocephalic and atraumatic.     Mouth/Throat:     Mouth: Mucous membranes are moist.  Eyes:     General: Vision grossly intact. Gaze aligned appropriately.     Extraocular Movements: Extraocular movements intact.  Conjunctiva/sclera: Conjunctivae normal.  Cardiovascular:     Rate and Rhythm: Normal rate and regular rhythm.     Pulses: Normal pulses.     Heart sounds: Normal heart sounds, S1 normal and S2 normal. No murmur heard.    No friction rub. No gallop.  Pulmonary:     Effort: Pulmonary effort is normal. No respiratory distress.     Breath sounds: Normal breath sounds.  Chest:     Chest wall: Tenderness present.    Abdominal:     General: Bowel sounds are normal.     Palpations: Abdomen is soft.     Tenderness: There is no abdominal tenderness. There is no guarding or rebound.     Hernia: No hernia is present.  Musculoskeletal:        General: No swelling.     Cervical back: Full passive  range of motion without pain, normal range of motion and neck supple. No spinous process tenderness or muscular tenderness. Normal range of motion.     Right lower leg: No edema.     Left lower leg: No edema.  Skin:    General: Skin is warm and dry.     Capillary Refill: Capillary refill takes less than 2 seconds.     Findings: No ecchymosis, erythema, rash or wound.  Neurological:     General: No focal deficit present.     Mental Status: She is alert and oriented to person, place, and time.     GCS: GCS eye subscore is 4. GCS verbal subscore is 5. GCS motor subscore is 6.     Cranial Nerves: Cranial nerves 2-12 are intact.     Sensory: Sensation is intact.     Motor: Motor function is intact.     Coordination: Coordination is intact.  Psychiatric:        Attention and Perception: Attention normal.        Mood and Affect: Mood normal.        Speech: Speech normal.        Behavior: Behavior normal.     ED Results / Procedures / Treatments   Labs (all labs ordered are listed, but only abnormal results are displayed) Labs Reviewed  BASIC METABOLIC PANEL - Abnormal; Notable for the following components:      Result Value   Potassium 3.4 (*)    CO2 18 (*)    Glucose, Bld 104 (*)    All other components within normal limits  D-DIMER, QUANTITATIVE - Abnormal; Notable for the following components:   D-Dimer, Quant 0.74 (*)    All other components within normal limits  CBC  LIPASE, BLOOD  POC URINE PREG, ED  TROPONIN I (HIGH SENSITIVITY)  TROPONIN I (HIGH SENSITIVITY)    EKG EKG Interpretation  Date/Time:  Monday Mar 27 2023 02:16:00 EDT Ventricular Rate:  100 PR Interval:  141 QRS Duration: 76 QT Interval:  397 QTC Calculation: 513 R Axis:   56 Text Interpretation: Sinus tachycardia Borderline T abnormalities, diffuse leads Prolonged QT interval Baseline wander in lead(s) I II III aVR aVF V1 V2 Confirmed by Gilda Crease [id number]) on 03/27/2023 3:04:56  AM  Radiology DG Chest 2 View  Result Date: 03/27/2023 CLINICAL DATA:  Chest cough EXAM: CHEST - 2 VIEW COMPARISON:  10/28/2017 FINDINGS: The heart size and mediastinal contours are within normal limits. Both lungs are clear. The visualized skeletal structures are unremarkable. IMPRESSION: Normal study. Electronically Signed   By: Charlett Nose M.D.  On: 03/27/2023 02:37    Procedures Procedures    Medications Ordered in ED Medications - No data to display  ED Course/ Medical Decision Making/ A&P                             Medical Decision Making Amount and/or Complexity of Data Reviewed Labs: ordered. Radiology: ordered.   Differential Diagnosis considered includes, but not limited to: COVID-19; influenza; RSV; simple viral URI; pneumonia; ACS; PE  Presents to the emergency department for evaluation of bilateral anterior rib discomfort.  Patient reports that she has had a cough and congestion for several days.  She does have sick contacts at work with similar.  She was seen in urgent care and was tested negative for flu and influenza.  Symptoms seem very atypical for cardiac etiology.  EKG, troponin x 2 reassuring.  Symptoms do not seem consistent with PE, however, she does periodically have tachycardia.  This seems to only occur when I am in the room talking with her, heart rate is in the 80s on telemetry until we have a discussion, likely some element of anxiety.  Patient did have a mildly elevated D-dimer.  Unfortunately she has an IV contrast allergy.  Shared decision making initiated.  She was counseled that I cannot rule out PE in this case and PE is potentially life-threatening.  She seems to be low risk for PE based on symptoms but it should be ruled out.  Because of her allergy, she would need premedication and this would be a lengthy process.  Patient does not wish to undergo this.  As said, doubt PE based on gestalt.  Patient will be treated for acute  bronchitis.         Final Clinical Impression(s) / ED Diagnoses Final diagnoses:  Acute bronchitis, unspecified organism    Rx / DC Orders ED Discharge Orders          Ordered    predniSONE (DELTASONE) 20 MG tablet  Daily with breakfast        03/27/23 0436    doxycycline (VIBRAMYCIN) 100 MG capsule  2 times daily        03/27/23 0436              Gilda Crease, MD 03/27/23 [phone]

## 2023-03-28 DIAGNOSIS — E559 Vitamin D deficiency, unspecified: Secondary | ICD-10-CM | POA: Diagnosis not present

## 2023-03-28 DIAGNOSIS — R1013 Epigastric pain: Secondary | ICD-10-CM

## 2023-03-28 DIAGNOSIS — I10 Essential (primary) hypertension: Secondary | ICD-10-CM | POA: Diagnosis not present

## 2023-03-28 DIAGNOSIS — E7800 Pure hypercholesterolemia, unspecified: Secondary | ICD-10-CM | POA: Diagnosis not present

## 2023-03-28 DIAGNOSIS — K2100 Gastro-esophageal reflux disease with esophagitis, without bleeding: Secondary | ICD-10-CM | POA: Diagnosis not present

## 2023-03-29 ENCOUNTER — Other Ambulatory Visit: Payer: Self-pay

## 2023-03-29 DIAGNOSIS — R1013 Epigastric pain: Secondary | ICD-10-CM

## 2023-03-29 NOTE — Telephone Encounter (Signed)
Darl Pikes: can we see if an appt available for me to see patient tomorrow?  Dena: can patient please complete HFP in interim? I have a recent CBC already. I have put orders into [facility]. Just need to be released. I have sent MyChart to patient as well.

## 2023-03-29 NOTE — Telephone Encounter (Signed)
Noted   Labs have been released

## 2023-03-30 ENCOUNTER — Ambulatory Visit (HOSPITAL_COMMUNITY)
Admission: RE | Admit: 2023-03-30 | Discharge: 2023-03-30 | Disposition: A | Discharge status: Home / Self Care | Payer: BC Managed Care – PPO | Source: Ambulatory Visit | Attending: Gastroenterology | Admitting: Gastroenterology

## 2023-03-30 ENCOUNTER — Encounter: Payer: Self-pay | Admitting: Gastroenterology

## 2023-03-30 ENCOUNTER — Ambulatory Visit (INDEPENDENT_AMBULATORY_CARE_PROVIDER_SITE_OTHER): Payer: BC Managed Care – PPO | Admitting: Gastroenterology

## 2023-03-30 VITALS — BP 127/88 | HR 75 | Temp 98.1°F | Ht 64.0 in | Wt 190.1 lb

## 2023-03-30 DIAGNOSIS — R1013 Epigastric pain: Secondary | ICD-10-CM

## 2023-03-30 DIAGNOSIS — K5900 Constipation, unspecified: Secondary | ICD-10-CM | POA: Diagnosis not present

## 2023-03-30 LAB — HEPATIC FUNCTION PANEL
ALT: 10 IU/L (ref 0–32)
AST: 13 IU/L (ref 0–40)
Albumin: 4.2 g/dL (ref 3.9–4.9)
Alkaline Phosphatase: 75 IU/L (ref 44–121)
Bilirubin Total: 0.5 mg/dL (ref 0.0–1.2)
Bilirubin, Direct: 0.13 mg/dL (ref 0.00–0.40)
Total Protein: 7 g/dL (ref 6.0–8.5)

## 2023-03-30 NOTE — Patient Instructions (Signed)
I would like for you to try Linzess. Linzess works best when taken once a day every day, on an empty stomach, at least 30 minutes before your first meal of the day.  When Linzess is taken daily as directed:  *Constipation relief is typically felt in about a week *IBS-C patients may begin to experience relief from belly pain and overall abdominal symptoms (pain, discomfort, and bloating) in about 1 week,   with symptoms typically improving over 12 weeks.  Diarrhea may occur in the first 2 weeks -keep taking it.  The diarrhea should go away and you should start having normal, complete, full bowel movements. It may be helpful to start treatment when you can be near the comfort of your own bathroom, such as a weekend.    Let me know how the dosage of Linzess works! I am starting you on lowest dose.  Continue pantoprazole daily.  Please have xray done today.  If no improvement by Monday, we will do a CT scan!  I enjoyed seeing you again today! I value our relationship and want to provide genuine, compassionate, and quality care. You may receive a survey regarding your visit with me, and I welcome your feedback! Thanks so much for taking the time to complete this. I look forward to seeing you again.      Gelene Mink, PhD, ANP-BC [facility]

## 2023-03-30 NOTE — Progress Notes (Signed)
Gastroenterology Office Note     Primary Care Physician:  Gareth Morgan, MD  Primary Gastroenterologist: Dr. Marletta Lor    Chief Complaint   Chief Complaint  Patient presents with   Follow-up    Follow up abd pain. Lab results in chart     History of Present Illness   Karla Ward is a 52 y.o. female presenting today urgently after being seen about one week prior for epigastric pain, which started after Mother's Day.   At last visit, she was not in pain at the moment, and it appeared that symptoms were improving. She had taken carafate with some improvement. On omeprazole chronically. This was changed to pantoprazole last week. She messaged with worsening symptoms this week and concerned. HFP today normal. CBC normal. Lipase normal.   In the ED on 5/27 due to bilateral upper abdominal pain just below ribs. ED was concerned for acute bronchitis. CXR unrevealing.   Waxing and waning epigastric pain. Underlying. Not r/t to eating necessarily. Just started pantoprazole yesterday as [facility] didn't have in stock until yesterday. BM QOD. Occasional straining. Sometimes  unproductive. No blood. Intentional weight loss reported. Eating light. Anxious. She has a history of epigastric pain intermittently thoughout the years.    [place] Sept 2023: no gallstones.   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 Prefers Cologuard.   Past Medical History:  Diagnosis Date   Anemia    Dizziness    Gastritis    GERD (gastroesophageal reflux disease)    Hypertension    Iron deficiency    Meniere disease    Status post dilation of esophageal narrowing    Vertigo    Vitamin D deficiency     Past Surgical History:  Procedure Laterality Date   DILITATION & CURRETTAGE/HYSTROSCOPY WITH NOVASURE ABLATION N/A 04/05/2017   Procedure: DILATATION & CURETTAGE/HYSTEROSCOPY WITH NOVASURE ENDOMETRIAL ABLATION;  Surgeon: Lazaro Arms, MD;   Location: [facility]S;  Service: Gynecology;  Laterality: N/A;   ESOPHAGOGASTRODUODENOSCOPY N/A 09/02/2015   ZOX:WRUE non-erosive gastritis/stricture at the gastroesophageal junction    NONE TO DATE  08/13/15   SAVORY DILATION  09/02/2015   Procedure: SAVORY DILATION;  Surgeon: West Bali, MD;  Location: [facility] END[facility] SUITE;  Service: Endoscopy;;    Current Outpatient Medications  Medication Sig Dispense Refill   Albuterol Sulfate, sensor, (PROAIR DIGIHALER) 108 (90 Base) MCG/ACT AEPB Inhale into the lungs as needed.     amLODipine (NORVASC) 5 MG tablet Take 5 mg by mouth daily.     Ascorbic Acid (VITAMIN C PO) Take by mouth daily.     clotrimazole-betamethasone (LOTRISONE) cream APPLY TO AFFECTED AREA TWICE A DAY 30 g 2   doxycycline (VIBRAMYCIN) 100 MG capsule Take 1 capsule (100 mg total) by mouth 2 (two) times daily. 14 capsule 0   fluticasone (FLONASE) 50 MCG/ACT nasal spray Place 2 sprays into both nostrils as needed.     meclizine (ANTIVERT) 25 MG tablet Take 50 mg by mouth 2 (two) times daily as needed.     medroxyPROGESTERone (DEPO-PROVERA) 150 MG/ML injection INJECT 1 ML (150 MG TOTAL) INTO THE MUSCLE EVERY 3 (THREE) MONTHS 1 mL 3   ondansetron (ZOFRAN) 4 MG tablet Take 4 mg by mouth as needed.     pantoprazole (PROTONIX) 40 MG tablet Take 1 tablet (40 mg total) by mouth daily. 30 minutes before eating. 90 tablet 3   sucralfate (CARAFATE) 1 GM/10ML suspension Take 10 mLs (1  g total) by mouth 4 (four) times daily -  with meals and at bedtime for 14 days. 560 mL 0   VITAMIN D PO Take by mouth daily.     omeprazole (PRILOSEC) 20 MG capsule Take 1 capsule (20 mg total) by mouth 2 (two) times daily before a meal. (Patient not taking: Reported on 03/30/2023) 180 capsule 3   predniSONE (DELTASONE) 20 MG tablet Take 2 tablets (40 mg total) by mouth daily with breakfast. (Patient not taking: Reported on 03/30/2023) 10 tablet 0   No current facility-administered medications for this visit.     Allergies as of 03/30/2023 - Review Complete 03/30/2023  Allergen Reaction Noted   Benadryl [diphenhydramine hcl (sleep)]  12/26/2014   Ciprofloxacin  04/29/2021   Ibuprofen  04/29/2021   Penicillins Hives 11/19/2012   Vitamin d analogs Other (See Comments) 04/29/2021   Iodinated contrast media Rash 09/09/2022    Family History  Problem Relation Age of Onset   Throat cancer Father 25   Hypertension Father    Hypertension Mother    Congestive Heart Failure Sister    Colon cancer Neg Hx    Colon polyps Neg Hx     Social History   Socioeconomic History   Marital status: Married    Spouse name: Not on file   Number of children: 2   Years of education: Not on file   Highest education level: Not on file  Occupational History   Not on file  Tobacco Use   Smoking status: Never   Smokeless tobacco: Never  Vaping Use   Vaping Use: Never used  Substance and Sexual Activity   Alcohol use: No   Drug use: No   Sexual activity: Yes    Birth control/protection: Surgical, Injection    Comment: ablation  Other Topics Concern   Not on file  Social History Narrative   Lives with husband   Right handed   Caffeine- some    Social Determinants of Health   Financial Resource Strain: Low Risk  (07/06/2022)   Overall Financial Resource Strain (CARDIA)    Difficulty of Paying Living Expenses: Not hard at all  Food Insecurity: No Food Insecurity (07/06/2022)   Hunger Vital Sign    Worried About Running Out of Food in the Last Year: Never true    Ran Out of Food in the Last Year: Never true  Transportation Needs: No Transportation Needs (07/06/2022)   PRAPARE - Administrator, Civil Service (Medical): No    Lack of Transportation (Non-Medical): No  Physical Activity: Inactive (07/06/2022)   Exercise Vital Sign    Days of Exercise per Week: 0 days    Minutes of Exercise per Session: 0 min  Stress: No Stress Concern Present (07/06/2022)   [employer] of Occupational  Health - Occupational Stress Questionnaire    Feeling of Stress : Only a little  Social Connections: Socially Integrated (07/06/2022)   Social Connection and Isolation Panel [NHANES]    Frequency of Communication with Friends and Family: More than three times a week    Frequency of Social Gatherings with Friends and Family: Twice a week    Attends Religious Services: More than 4 times per year    Active Member of [employer] or Organizations: Yes    Attends Banker Meetings: More than 4 times per year    Marital Status: Married  Catering manager Violence: Not At Risk (07/06/2022)   Humiliation, Afraid, Rape, and Kick questionnaire  Fear of Current or Ex-Partner: No    Emotionally Abused: No    Physically Abused: No    Sexually Abused: No     Review of Systems   Gen: Denies any fever, chills, fatigue, weight loss, lack of appetite.  CV: Denies chest pain, heart palpitations, peripheral edema, syncope.  Resp: Denies shortness of breath at rest or with exertion. Denies wheezing or cough.  GI: Denies dysphagia or odynophagia. Denies jaundice, hematemesis, fecal incontinence. GU : Denies urinary burning, urinary frequency, urinary hesitancy MS: Denies joint pain, muscle weakness, cramps, or limitation of movement.  Derm: Denies rash, itching, dry skin Psych: Denies depression, anxiety, memory loss, and confusion Heme: Denies bruising, bleeding, and enlarged lymph nodes.   Physical Exam   BP 127/88   Pulse 75   Temp 98.1 F (36.7 C)   Ht 5\' 4"  (1.626 m)   Wt 190 lb 1.6 oz (86.2 kg)   LMP  (LMP Unknown) Comment: eblation and depo shot  BMI 32.63 kg/m  General:   Alert and oriented. Pleasant and cooperative. Well-nourished and well-developed.  Head:  Normocephalic and atraumatic. Eyes:  Without icterus Abdomen:  +BS, soft, non-tender and non-distended. No HSM noted. No guarding or rebound. No masses appreciated.  Rectal:  Deferred  Msk:  Symmetrical without gross  deformities. Normal posture. Extremities:  Without edema. Neurologic:  Alert and  oriented x4;  grossly normal neurologically. Skin:  Intact without significant lesions or rashes. Psych:  Alert and cooperative. Normal mood and affect.  Lab Results  Component Value Date   WBC 6.9 03/27/2023   HGB 14.1 03/27/2023   HCT 42.3 03/27/2023   MCV 87.2 03/27/2023   PLT 258 03/27/2023   Lab Results  Component Value Date   ALT 10 03/29/2023   AST 13 03/29/2023   ALKPHOS 75 03/29/2023   BILITOT 0.5 03/29/2023   Lab Results  Component Value Date   CREATININE 0.92 03/27/2023   BUN 7 03/27/2023   NA 138 03/27/2023   K 3.4 (L) 03/27/2023   CL 105 03/27/2023   CO2 18 (L) 03/27/2023   Lab Results  Component Value Date   LIPASE 42 03/27/2023      Assessment   Karla Ward is a 52 y.o. female presenting today urgently after being seen about one week prior for epigastric pain, which started after Mother's Day.   Epigastric pain: symptoms do not appear biliary. [place] without gallstones Sept 2023 and LFTs, lipase normal. Differentials broad including gastritis, PUD, possible underlying constipation as she reports unproductive stools and straining at times, doubt pancreatitis as lipase normal. I highly suspect multifactorial with underlying constipation not addressed and possibly gastritis component. Also notable, she just now started the pantoprazole yesterday (previously on omeprazole).   We will pursue an abdominal xray. If she is not improved over the weekend after starting Linzess, we will do a CT. However, she is allergic to the IV contrast (rash) and would need to be pre-medicated. She is declining this. She would prefer no oral contrast, either. She is willing to try oral contrast if CT was needed. If CT negative, would pursue EGD.  No prior colonoscopy, and she has declined this, wanting to do Cologuard through PCP.She does not have any obstructive symptoms.      PLAN     Continue pantoprazole daily Trial of Linzess 72 mcg daily. Samples provided Abdominal xray today to assess stool burden Message on Monday with report If no improvement, needs CT If CT negative,  needs EGD   Gelene Mink, PhD, ANP-BC [facility]

## 2023-04-10 DIAGNOSIS — R300 Dysuria: Secondary | ICD-10-CM | POA: Diagnosis not present

## 2023-04-10 DIAGNOSIS — N3001 Acute cystitis with hematuria: Secondary | ICD-10-CM | POA: Diagnosis not present

## 2023-04-12 ENCOUNTER — Ambulatory Visit (INDEPENDENT_AMBULATORY_CARE_PROVIDER_SITE_OTHER): Payer: BC Managed Care – PPO | Admitting: *Deleted

## 2023-04-12 DIAGNOSIS — Z3042 Encounter for surveillance of injectable contraceptive: Secondary | ICD-10-CM | POA: Diagnosis not present

## 2023-04-12 MED ORDER — MEDROXYPROGESTERONE ACETATE 150 MG/ML IM SUSY
150.0000 mg | PREFILLED_SYRINGE | Freq: Once | INTRAMUSCULAR | Status: AC
Start: 2023-04-12 — End: 2023-04-12
  Administered 2023-04-12: 150 mg via INTRAMUSCULAR

## 2023-04-12 NOTE — Progress Notes (Signed)
   NURSE VISIT- INJECTION  SUBJECTIVE:  Karla Ward is a 52 y.o. [phone] female here for a Depo Provera for contraception/period management. She is a GYN patient.   OBJECTIVE:  LMP  (LMP Unknown) Comment: eblation and depo shot  Appears well, in no apparent distress  Injection administered in: Left deltoid  Meds ordered this encounter  Medications   medroxyPROGESTERone Acetate SUSY 150 mg    ASSESSMENT: GYN patient Depo Provera for contraception/period management PLAN: Follow-up: in 11-13 weeks for next Depo   Malachy Mood  04/12/2023 10:00 AM

## 2023-04-17 DIAGNOSIS — R350 Frequency of micturition: Secondary | ICD-10-CM | POA: Diagnosis not present

## 2023-04-17 DIAGNOSIS — R3121 Asymptomatic microscopic hematuria: Secondary | ICD-10-CM | POA: Diagnosis not present

## 2023-05-02 ENCOUNTER — Encounter: Payer: Self-pay | Admitting: Gastroenterology

## 2023-05-02 ENCOUNTER — Ambulatory Visit: Payer: BC Managed Care – PPO | Admitting: Gastroenterology

## 2023-05-19 DIAGNOSIS — H1045 Other chronic allergic conjunctivitis: Secondary | ICD-10-CM | POA: Diagnosis not present

## 2023-05-23 ENCOUNTER — Encounter: Payer: Self-pay | Admitting: Gastroenterology

## 2023-05-23 ENCOUNTER — Ambulatory Visit: Payer: BC Managed Care – PPO | Admitting: Gastroenterology

## 2023-05-23 VITALS — BP 132/82 | HR 70 | Temp 98.6°F | Ht 64.0 in | Wt 189.2 lb

## 2023-05-23 DIAGNOSIS — Z1211 Encounter for screening for malignant neoplasm of colon: Secondary | ICD-10-CM | POA: Diagnosis not present

## 2023-05-23 NOTE — Progress Notes (Signed)
Pantoprazole once daily. Carafate just as needed, once in ab lue moon.   Constipation: will take Miralax just as needed. BM daily. Hasn't needed it in about 2 weeks. Trying to eat healthier, drink more water, taking Benefiber. Eating more fruit and greens.   Requesting Cologuard.       Gastroenterology Office Note     Primary Care Physician:  Gareth Morgan, MD  Primary Gastroenterologist: Dr Marletta Lor   Chief Complaint   Chief Complaint  Patient presents with   Follow-up    Pt here for follow up on abd pain and constipation     History of Present Illness   Alekhya Gravlin is a 52 y.o. female presenting today  in follow-up for recent bout with epigastric pain and constipation. Last seen Mar 30, 2023 urgently.   She had chronically been on omeprazole but changed to pantoprazole at that visit. Labs including HFP, CBC, lipase all normal. Constipation occurring at this time as well. Xray with constipation.   She notes resolution of abdominal pain on pantoprazole daily. Carafate just as needed, once in a blue moon.   Constipation: will take Miralax just as needed. BM daily. Hasn't needed it in about 2 weeks. Trying to eat healthier, drink more water, taking Benefiber. Eating more fruit and greens.     [place] Sept 2023: no gallstones.    EGD 2016: mild non-erosive gastritis/stricture at the gastroesophageal junction    No prior colonoscopy. Prefers Cologuard  Past Medical History:  Diagnosis Date   Anemia    Dizziness    Gastritis    GERD (gastroesophageal reflux disease)    Hypertension    Iron deficiency    Meniere disease    Status post dilation of esophageal narrowing    Vertigo    Vitamin D deficiency     Past Surgical History:  Procedure Laterality Date   DILITATION & CURRETTAGE/HYSTROSCOPY WITH NOVASURE ABLATION N/A 04/05/2017   Procedure: DILATATION & CURETTAGE/HYSTEROSCOPY WITH NOVASURE ENDOMETRIAL ABLATION;  Surgeon: Lazaro Arms, MD;  Location: [facility];  Service: Gynecology;  Laterality: N/A;   ESOPHAGOGASTRODUODENOSCOPY N/A 09/02/2015   ZYS:AYTK non-erosive gastritis/stricture at the gastroesophageal junction    NONE TO DATE  08/13/15   SAVORY DILATION  09/02/2015   Procedure: SAVORY DILATION;  Surgeon: West Bali, MD;  Location: [facility];  Service: Endoscopy;;    Current Outpatient Medications  Medication Sig Dispense Refill   Albuterol Sulfate, sensor, (PROAIR DIGIHALER) 108 (90 Base) MCG/ACT AEPB Inhale into the lungs as needed.     amLODipine (NORVASC) 5 MG tablet Take 5 mg by mouth daily.     Ascorbic Acid (VITAMIN C PO) Take by mouth daily.     clotrimazole-betamethasone (LOTRISONE) cream APPLY TO AFFECTED AREA TWICE A DAY 30 g 2   fluticasone (FLONASE) 50 MCG/ACT nasal spray Place 2 sprays into both nostrils as needed.     meclizine (ANTIVERT) 25 MG tablet Take 50 mg by mouth 2 (two) times daily as needed.     medroxyPROGESTERone (DEPO-PROVERA) 150 MG/ML injection INJECT 1 ML (150 MG TOTAL) INTO THE MUSCLE EVERY 3 (THREE) MONTHS 1 mL 3   ondansetron (ZOFRAN) 4 MG tablet Take 4 mg by mouth as needed.     pantoprazole (PROTONIX) 40 MG tablet Take 1 tablet (40 mg total) by mouth daily. 30 minutes before eating. 90 tablet 3   sucralfate (CARAFATE) 1 GM/10ML suspension Take 10 mLs (1 g total) by mouth 4 (four) times daily -  with  meals and at bedtime for 14 days. 560 mL 0   VITAMIN D PO Take by mouth daily.     No current facility-administered medications for this visit.    Allergies as of 05/23/2023 - Review Complete 05/23/2023  Allergen Reaction Noted   Benadryl [diphenhydramine hcl (sleep)]  12/26/2014   Ibuprofen  04/29/2021   Penicillins Hives 11/19/2012   Vitamin d analogs Other (See Comments) 04/29/2021   Iodinated contrast media Rash 09/09/2022    Family History  Problem Relation Age of Onset   Throat cancer Father 71   Hypertension Father    Hypertension Mother    Congestive Heart Failure Sister     Colon cancer Neg Hx    Colon polyps Neg Hx     Social History   Socioeconomic History   Marital status: Married    Spouse name: Not on file   Number of children: 2   Years of education: Not on file   Highest education level: Not on file  Occupational History   Not on file  Tobacco Use   Smoking status: Never   Smokeless tobacco: Never  Vaping Use   Vaping status: Never Used  Substance and Sexual Activity   Alcohol use: No   Drug use: No   Sexual activity: Yes    Birth control/protection: Surgical, Injection    Comment: ablation  Other Topics Concern   Not on file  Social History Narrative   Lives with husband   Right handed   Caffeine- some    Social Determinants of Health   Financial Resource Strain: Low Risk  (07/06/2022)   Overall Financial Resource Strain (CARDIA)    Difficulty of Paying Living Expenses: Not hard at all  Food Insecurity: No Food Insecurity (07/06/2022)   Hunger Vital Sign    Worried About Running Out of Food in the Last Year: Never true    Ran Out of Food in the Last Year: Never true  Transportation Needs: No Transportation Needs (07/06/2022)   PRAPARE - Administrator, Civil Service (Medical): No    Lack of Transportation (Non-Medical): No  Physical Activity: Inactive (07/06/2022)   Exercise Vital Sign    Days of Exercise per Week: 0 days    Minutes of Exercise per Session: 0 min  Stress: No Stress Concern Present (07/06/2022)   [employer] of Occupational Health - Occupational Stress Questionnaire    Feeling of Stress : Only a little  Social Connections: Socially Integrated (07/06/2022)   Social Connection and Isolation Panel [NHANES]    Frequency of Communication with Friends and Family: More than three times a week    Frequency of Social Gatherings with Friends and Family: Twice a week    Attends Religious Services: More than 4 times per year    Active Member of [employer] or Organizations: Yes    Attends Hospital doctor: More than 4 times per year    Marital Status: Married  Catering manager Violence: Not At Risk (08/30/2022)   Received from [facility], [facility]   Humiliation, Afraid, Rape, and Kick questionnaire    Fear of Current or Ex-Partner: No    Emotionally Abused: No    Physically Abused: No    Sexually Abused: No     Review of Systems   Gen: Denies any fever, chills, fatigue, weight loss, lack of appetite.  CV: Denies chest pain, heart palpitations, peripheral edema, syncope.  Resp: Denies shortness of breath at rest or  with exertion. Denies wheezing or cough.  GI: Denies dysphagia or odynophagia. Denies jaundice, hematemesis, fecal incontinence. GU : Denies urinary burning, urinary frequency, urinary hesitancy MS: Denies joint pain, muscle weakness, cramps, or limitation of movement.  Derm: Denies rash, itching, dry skin Psych: Denies depression, anxiety, memory loss, and confusion Heme: Denies bruising, bleeding, and enlarged lymph nodes.   Physical Exam   BP 132/82   Pulse 70   Temp 98.6 F (37 C)   Ht 5\' 4"  (1.626 m)   Wt 189 lb 3.2 oz (85.8 kg)   BMI 32.48 kg/m  General:   Alert and oriented. Pleasant and cooperative. Well-nourished and well-developed.  Head:  Normocephalic and atraumatic. Eyes:  Without icterus Abdomen:  +BS, soft, non-tender and non-distended. No HSM noted. No guarding or rebound. No masses appreciated.  Rectal:  Deferred  Msk:  Symmetrical without gross deformities. Normal posture. Extremities:  Without edema. Neurologic:  Alert and  oriented x4;  grossly normal neurologically. Skin:  Intact without significant lesions or rashes. Psych:  Alert and cooperative. Normal mood and affect.   Assessment   Dashayla Theissen is a 52 y.o. female presenting today  in follow-up for recent bout with epigastric pain and constipation. Last seen Mar 30, 2023 urgently.   Epigastric pain: now resolved s/p changing from omeprazole to  pantoprazole. I note she also had resolution of constipation, which was likely contributing. No alarm signs/symptoms as noted above.  Constipation: now resolved. She has increased fiber both in diet and supplementary with Benefiber. Miralax is needed only sparingly.  Screening colon cancer: desires to do Cologuard. Her PCP will be retiring in a month. I will order this for her so it will result to [place]. She will be reaching out to [facility] to establish new patient appt.      PLAN    Continue pantoprazole daily High fiber diet, Benefiber, Miralax prn Cologuard ordered 6 month return   Gelene Mink, PhD, [facility] Gastroenterology

## 2023-05-23 NOTE — Patient Instructions (Signed)
Please do the Cologuard when you receive the new box.  Continue pantoprazole daily and can take Miralax as needed.  We will see you in 6 months or sooner if needed!  I enjoyed seeing you again today! I value our relationship and want to provide genuine, compassionate, and quality care. You may receive a survey regarding your visit with me, and I welcome your feedback! Thanks so much for taking the time to complete this. I look forward to seeing you again.      Gelene Mink, PhD, ANP-BC [facility] Gastroenterology

## 2023-05-29 DIAGNOSIS — I10 Essential (primary) hypertension: Secondary | ICD-10-CM | POA: Diagnosis not present

## 2023-07-05 ENCOUNTER — Ambulatory Visit (INDEPENDENT_AMBULATORY_CARE_PROVIDER_SITE_OTHER): Payer: BC Managed Care – PPO | Admitting: *Deleted

## 2023-07-05 DIAGNOSIS — Z3042 Encounter for surveillance of injectable contraceptive: Secondary | ICD-10-CM | POA: Diagnosis not present

## 2023-07-05 MED ORDER — MEDROXYPROGESTERONE ACETATE 150 MG/ML IM SUSP
150.0000 mg | Freq: Once | INTRAMUSCULAR | Status: AC
Start: 2023-07-05 — End: 2023-07-05
  Administered 2023-07-05: 150 mg via INTRAMUSCULAR

## 2023-07-05 NOTE — Progress Notes (Signed)
   NURSE VISIT- INJECTION  SUBJECTIVE:  Karla Ward is a 52 y.o. [phone] female here for a Depo Provera for contraception/period management. She is a GYN patient.   OBJECTIVE:  There were no vitals taken for this visit.  Appears well, in no apparent distress  Injection administered in: Right deltoid  Meds ordered this encounter  Medications   medroxyPROGESTERone (DEPO-PROVERA) injection 150 mg    ASSESSMENT: GYN patient Depo Provera for contraception/period management PLAN: Follow-up: in 11-13 weeks for next Depo   Jobe Marker  07/05/2023 9:59 AM

## 2023-07-20 DIAGNOSIS — J069 Acute upper respiratory infection, unspecified: Secondary | ICD-10-CM | POA: Diagnosis not present

## 2023-07-20 DIAGNOSIS — E669 Obesity, unspecified: Secondary | ICD-10-CM | POA: Diagnosis not present

## 2023-07-20 DIAGNOSIS — R030 Elevated blood-pressure reading, without diagnosis of hypertension: Secondary | ICD-10-CM | POA: Diagnosis not present

## 2023-07-20 DIAGNOSIS — Z6834 Body mass index (BMI) 34.0-34.9, adult: Secondary | ICD-10-CM | POA: Diagnosis not present

## 2023-07-21 ENCOUNTER — Ambulatory Visit
Admission: EM | Admit: 2023-07-21 | Discharge: 2023-07-21 | Disposition: A | Discharge status: Home / Self Care | Payer: BC Managed Care – PPO | Attending: Family Medicine | Admitting: Family Medicine

## 2023-07-21 ENCOUNTER — Other Ambulatory Visit: Payer: Self-pay

## 2023-07-21 ENCOUNTER — Encounter: Payer: Self-pay | Admitting: Emergency Medicine

## 2023-07-21 ENCOUNTER — Ambulatory Visit: Payer: BC Managed Care – PPO

## 2023-07-21 DIAGNOSIS — B9789 Other viral agents as the cause of diseases classified elsewhere: Secondary | ICD-10-CM | POA: Insufficient documentation

## 2023-07-21 DIAGNOSIS — R059 Cough, unspecified: Secondary | ICD-10-CM | POA: Insufficient documentation

## 2023-07-21 DIAGNOSIS — R0789 Other chest pain: Secondary | ICD-10-CM | POA: Diagnosis not present

## 2023-07-21 DIAGNOSIS — J069 Acute upper respiratory infection, unspecified: Secondary | ICD-10-CM | POA: Insufficient documentation

## 2023-07-21 DIAGNOSIS — R062 Wheezing: Secondary | ICD-10-CM | POA: Diagnosis not present

## 2023-07-21 DIAGNOSIS — Z1152 Encounter for screening for COVID-19: Secondary | ICD-10-CM | POA: Diagnosis not present

## 2023-07-21 MED ORDER — PROMETHAZINE-DM 6.25-15 MG/5ML PO SYRP: 5.0000 mL | ORAL_SOLUTION | Freq: Four times a day (QID) | ORAL | 0 refills | Status: DC | PRN

## 2023-07-21 MED ORDER — ALBUTEROL SULFATE HFA 108 (90 BASE) MCG/ACT IN AERS: 2.0000 | INHALATION_SPRAY | RESPIRATORY_TRACT | 0 refills | Status: DC | PRN

## 2023-07-21 MED ORDER — IPRATROPIUM-ALBUTEROL 0.5-2.5 (3) MG/3ML IN SOLN
3.0000 mL | Freq: Once | RESPIRATORY_TRACT | Status: AC
  Administered 2023-07-21: 3 mL via RESPIRATORY_TRACT

## 2023-07-21 MED ORDER — PREDNISONE 20 MG PO TABS: 40.0000 mg | ORAL_TABLET | Freq: Every day | ORAL | 0 refills | Status: DC

## 2023-07-21 NOTE — Discharge Instructions (Signed)
I sent over medications to help with your wheezing and chest tightness, your COVID test should be back tomorrow and your chest x-ray should be back later today.  I will call you if anything comes back abnormal on that x-ray.  Otherwise, proceed as discussed.

## 2023-07-21 NOTE — ED Triage Notes (Signed)
Pt reports productive cough for last several days with intermittent wheezing. Denies any known fever or other symptoms at this time.

## 2023-07-22 LAB — SARS CORONAVIRUS 2 (TAT 6-24 HRS): SARS Coronavirus 2: NEGATIVE

## 2023-07-22 NOTE — ED Provider Notes (Signed)
[facility]    CSN: [id number] Arrival date & time: 07/21/23  0820      History   Chief Complaint Chief Complaint  Patient presents with   Cough    HPI Karla Ward is a 52 y.o. female.   Patient presenting today with several day history of cough, wheezing, chest tightness.  Denies congestion, sore throat, fever, chills, body aches.  So far taking over-the-counter medications such as Mucinex with minimal relief.  No known history of chronic pulmonary disease.    Past Medical History:  Diagnosis Date   Anemia    Dizziness    Gastritis    GERD (gastroesophageal reflux disease)    Hypertension    Iron deficiency    Meniere disease    Status post dilation of esophageal narrowing    Vertigo    Vitamin D deficiency     Patient Active Problem List   Diagnosis Date Noted   History of UTI 07/06/2022   Other microscopic hematuria 07/06/2022   Constipation 07/05/2022   Urinary tract infection with hematuria 06/22/2022   Pelvic pain 06/22/2022   Urinary frequency 06/22/2022   Acute bilateral low back pain without sciatica 06/22/2022   Encounter for surveillance of injectable contraceptive 06/21/2021   Encounter for screening fecal occult blood testing 06/21/2021   Encounter for well woman exam with routine gynecological exam 06/21/2021   Atypical squamous cell changes of undetermined significance (ASCUS) on cervical cytology with negative high risk human papilloma virus (HPV) test result 06/21/2021   Encounter for colorectal cancer screening 01/20/2020   Encounter for gynecological examination with Papanicolaou smear of cervix 01/20/2020   Colon cancer screening 07/06/2017   N&V (nausea and vomiting) 09/14/2016   Menorrhagia 05/30/2016   GERD (gastroesophageal reflux disease) 08/13/2015   Palpitations 11/22/2012    Past Surgical History:  Procedure Laterality Date   DILITATION & CURRETTAGE/HYSTROSCOPY WITH NOVASURE ABLATION N/A 04/05/2017    Procedure: DILATATION & CURETTAGE/HYSTEROSCOPY WITH NOVASURE ENDOMETRIAL ABLATION;  Surgeon: Lazaro Arms, MD;  Location: [facility];  Service: Gynecology;  Laterality: N/A;   ESOPHAGOGASTRODUODENOSCOPY N/A 09/02/2015   HYQ:MVHQ non-erosive gastritis/stricture at the gastroesophageal junction    NONE TO DATE  08/13/15   SAVORY DILATION  09/02/2015   Procedure: SAVORY DILATION;  Surgeon: West Bali, MD;  [facility]: [facility];  Service: Endoscopy;;    OB History     Gravida  2   Para  2   Term      Preterm  2   AB      Living  2      SAB      IAB      Ectopic      Multiple      Live Births  2            Home Medications    Prior to Admission medications   Medication Sig Start Date End Date Taking? Authorizing Provider  albuterol (VENTOLIN HFA) 108 (90 Base) MCG/ACT inhaler Inhale 2 puffs into the lungs every 4 (four) hours as needed for wheezing or shortness of breath. 07/21/23  Yes Particia Nearing, PA-C  predniSONE (DELTASONE) 20 MG tablet Take 2 tablets (40 mg total) by mouth daily with breakfast. 07/21/23  Yes Particia Nearing, PA-C  promethazine-dextromethorphan (PROMETHAZINE-DM) 6.25-15 MG/5ML syrup Take 5 mLs by mouth 4 (four) times daily as needed. 07/21/23  Yes Particia Nearing, PA-C  Albuterol Sulfate, sensor, (PROAIR DIGIHALER) 108 (90 Base) MCG/ACT AEPB Inhale  into the lungs as needed.    [provider]  amLODipine (NORVASC) 5 MG tablet Take 5 mg by mouth daily.    [provider]  Ascorbic Acid (VITAMIN C PO) Take by mouth daily.    [provider]  clotrimazole-betamethasone (LOTRISONE) cream APPLY TO AFFECTED AREA TWICE A DAY 01/23/23   Cyril Mourning A, NP  fluticasone (FLONASE) 50 MCG/ACT nasal spray Place 2 sprays into both nostrils as needed. 12/08/22   [provider]  meclizine (ANTIVERT) 25 MG tablet Take 50 mg by mouth 2 (two) times daily as needed. 03/09/21   [provider]   medroxyPROGESTERone (DEPO-PROVERA) 150 MG/ML injection INJECT 1 ML (150 MG TOTAL) INTO THE MUSCLE EVERY 3 (THREE) MONTHS 03/21/23   Cyril Mourning A, NP  ondansetron (ZOFRAN) 4 MG tablet Take 4 mg by mouth as needed. 02/27/23   [provider]  pantoprazole (PROTONIX) 40 MG tablet Take 1 tablet (40 mg total) by mouth daily. 30 minutes before eating. 03/22/23   Gelene Mink, NP  sucralfate (CARAFATE) 1 GM/10ML suspension Take 10 mLs (1 g total) by mouth 4 (four) times daily -  with meals and at bedtime for 14 days. 03/22/23 05/23/23  Gelene Mink, NP  VITAMIN D PO Take by mouth daily.    [provider]    Family History Family History  Problem Relation Age of Onset   Throat cancer Father 89   Hypertension Father    Hypertension Mother    Congestive Heart Failure Sister    Colon cancer Neg Hx    Colon polyps Neg Hx     Social History Social History   Tobacco Use   Smoking status: Never   Smokeless tobacco: Never  Vaping Use   Vaping status: Never Used  Substance Use Topics   Alcohol use: No   Drug use: No     Allergies   Benadryl [diphenhydramine hcl (sleep)], Ibuprofen, Penicillins, Vitamin d analogs, and Iodinated contrast media   Review of Systems Review of Systems Per HPI  Physical Exam Triage Vital Signs ED Triage Vitals  Encounter Vitals Group     BP 07/21/23 0900 (!) 153/99     Systolic BP Percentile --      Diastolic BP Percentile --      Pulse Rate 07/21/23 0900 84     Resp 07/21/23 0900 20     Temp 07/21/23 0900 99.1 F (37.3 C)     Temp Source 07/21/23 0900 Oral     SpO2 07/21/23 0900 98 %     Weight --      Height --      Head Circumference --      Peak Flow --      Pain Score 07/21/23 0859 0     Pain Loc --      Pain Education --      Exclude from Growth Chart --    No data found.  Updated Vital Signs BP (!) 153/99 (BP Location: Right Arm)   Pulse 84   Temp 99.1 F (37.3 C) (Oral)   Resp 20   SpO2 98%   Visual  Acuity Right Eye Distance:   Left Eye Distance:   Bilateral Distance:    Right Eye Near:   Left Eye Near:    Bilateral Near:     Physical Exam Vitals and nursing note reviewed.  Constitutional:      Appearance: Normal appearance. She is not ill-appearing.  HENT:  Head: Atraumatic.     Nose: Rhinorrhea present.     Mouth/Throat:     Mouth: Mucous membranes are moist.     Pharynx: Oropharynx is clear. No posterior oropharyngeal erythema.  Eyes:     Extraocular Movements: Extraocular movements intact.     Conjunctiva/sclera: Conjunctivae normal.  Cardiovascular:     Rate and Rhythm: Normal rate and regular rhythm.     Heart sounds: Normal heart sounds.  Pulmonary:     Effort: Pulmonary effort is normal.     Breath sounds: Wheezing present. No rales.  Musculoskeletal:        General: Normal range of motion.     Cervical back: Normal range of motion and neck supple.  Skin:    General: Skin is warm and dry.  Neurological:     Mental Status: She is alert and oriented to person, place, and time.  Psychiatric:        Mood and Affect: Mood normal.        Thought Content: Thought content normal.        Judgment: Judgment normal.      UC Treatments / Results  Labs (all labs ordered are listed, but only abnormal results are displayed) Labs Reviewed  SARS CORONAVIRUS 2 (TAT 6-24 HRS)    EKG   Radiology DG Chest 2 View  Result Date: 07/21/2023 CLINICAL DATA:  3 days of cough, chest tightness, wheezing. exposure to covid EXAM: CHEST - 2 VIEW COMPARISON:  Chest x-ray May 27 24. FINDINGS: The heart size and mediastinal contours are within normal limits. Both lungs are clear. No visible pleural effusions or pneumothorax. No acute osseous abnormality. IMPRESSION: No active cardiopulmonary disease. Electronically Signed   By: Feliberto Harts M.D.   On: 07/21/2023 10:37    Procedures Procedures (including critical care time)  Medications Ordered in UC Medications   ipratropium-albuterol (DUONEB) 0.5-2.5 (3) MG/3ML nebulizer solution 3 mL (3 mLs Nebulization Given 07/21/23 0936)    Initial Impression / Assessment and Plan / UC Course  I have reviewed the triage vital signs and the nursing notes.  Pertinent labs & imaging results that were available during my care of the patient were reviewed by me and considered in my medical decision making (see chart for details).     Suspect viral respiratory infection causing bronchitis.  COVID testing pending, good improvement with DuoNeb treatment in clinic, chest x-ray negative for acute cardiopulmonary abnormality.  Treat with prednisone, albuterol, Phenergan DM and follow-up for worsening symptoms.  Final Clinical Impressions(s) / UC Diagnoses   Final diagnoses:  Viral URI with cough  Wheezing     Discharge Instructions      I sent over medications to help with your wheezing and chest tightness, your COVID test should be back tomorrow and your chest x-ray should be back later today.  I will call you if anything comes back abnormal on that x-ray.  Otherwise, proceed as discussed.    ED Prescriptions     Medication Sig Dispense Auth. Provider   predniSONE (DELTASONE) 20 MG tablet Take 2 tablets (40 mg total) by mouth daily with breakfast. 10 tablet Particia Nearing, PA-C   albuterol (VENTOLIN HFA) 108 (90 Base) MCG/ACT inhaler Inhale 2 puffs into the lungs every 4 (four) hours as needed for wheezing or shortness of breath. 18 g Roosvelt M[place], [place]   promethazine-dextromethorphan (PROMETHAZINE-DM) 6.25-15 MG/5ML syrup Take 5 mLs by mouth 4 (four) times daily as needed. 100 mL Particia Nearing,  PA-C      PDMP not reviewed this encounter.   Particia Nearing, [place] 07/22/23 1525

## 2023-07-28 ENCOUNTER — Ambulatory Visit (INDEPENDENT_AMBULATORY_CARE_PROVIDER_SITE_OTHER): Payer: BC Managed Care – PPO | Admitting: Adult Health

## 2023-07-28 ENCOUNTER — Encounter: Payer: Self-pay | Admitting: Adult Health

## 2023-07-28 VITALS — BP 121/76 | HR 86 | Ht 64.0 in | Wt 186.0 lb

## 2023-07-28 DIAGNOSIS — Z01419 Encounter for gynecological examination (general) (routine) without abnormal findings: Secondary | ICD-10-CM

## 2023-07-28 DIAGNOSIS — Z1231 Encounter for screening mammogram for malignant neoplasm of breast: Secondary | ICD-10-CM | POA: Insufficient documentation

## 2023-07-28 DIAGNOSIS — Z3042 Encounter for surveillance of injectable contraceptive: Secondary | ICD-10-CM

## 2023-07-28 DIAGNOSIS — Z1211 Encounter for screening for malignant neoplasm of colon: Secondary | ICD-10-CM

## 2023-07-28 LAB — HEMOCCULT GUIAC POC 1CARD (OFFICE): Fecal Occult Blood, POC: NEGATIVE

## 2023-07-28 NOTE — Progress Notes (Signed)
Patient ID: Karla Ward, female   DOB: 08-14-1971, 52 y.o.   MRN: [id number] History of Present Illness: Karla Ward is a 52 year old black female, married, PM in for a well woman gyn exam.     Component Value Date/Time   DIAGPAP  07/06/2022 0847    - Negative for intraepithelial lesion or malignancy (NILM)   DIAGPAP (A) 01/20/2020 0902    - Atypical squamous cells of undetermined significance (ASC-US)   HPVHIGH Negative 07/06/2022 0847   HPVHIGH Negative 01/20/2020 0902   ADEQPAP  07/06/2022 0847    Satisfactory for evaluation; transformation zone component PRESENT.   ADEQPAP  01/20/2020 0902    Satisfactory for evaluation; transformation zone component PRESENT.    PCP to be I Polanco NP  Current Medications, Allergies, Past Medical History, Past Surgical History, Family History and Social History were reviewed in [name] record.     Review of Systems: Patient denies any headaches, hearing loss, fatigue, blurred vision, shortness of breath, chest pain, abdominal pain, problems with bowel movements, urination, or intercourse. No joint pain or mood swings.  On depo   Physical Exam:BP 121/76 (BP Location: Left Arm, Patient Position: Sitting, Cuff Size: Normal)   Pulse 86   Ht 5\' 4"  (1.626 m)   Wt 186 lb (84.4 kg)   BMI 31.93 kg/m   General:  Well developed, well nourished, no acute distress Skin:  Warm and dry Neck:  Midline trachea, normal thyroid, good ROM, no lymphadenopathy Lungs; Clear to auscultation bilaterally Breast:  No dominant palpable mass, retraction, or nipple discharge Cardiovascular: Regular rate and rhythm Abdomen:  Soft, non tender, no hepatosplenomegaly Pelvic:  External genitalia is normal in appearance, no lesions.  The vagina is normal in appearance. Urethra has no lesions or masses. The cervix is smooth.  Uterus is felt to be normal size, shape, and contour.  No adnexal masses or tenderness noted.Bladder is non tender, no  masses felt. Rectal: Good sphincter tone, no polyps, or hemorrhoids felt.  Hemoccult negative. Extremities/musculoskeletal:  No swelling or varicosities noted, no clubbing or cyanosis Psych:  No mood changes, alert and cooperative,seems happy AA is 0 Fall risk is low    07/28/2023   10:37 AM 07/06/2022    8:41 AM 06/21/2021    8:46 AM  Depression screen PHQ 2/9  Decreased Interest 0 0 0  Down, Depressed, Hopeless 0 0 0  PHQ - 2 Score 0 0 0  Altered sleeping 0 2 0  Tired, decreased energy 0 2 0  Change in appetite 0 0 0  Feeling bad or failure about yourself  0 0 0  Trouble concentrating 0 0 0  Moving slowly or fidgety/restless 0 0 0  Suicidal thoughts 0 0 0  PHQ-9 Score 0 4 0       07/28/2023   10:37 AM 07/06/2022    8:41 AM 06/21/2021    8:47 AM  GAD 7 : Generalized Anxiety Score  Nervous, Anxious, on Edge 0 0 0  Control/stop worrying 0 0 0  Worry too much - different things 0 2 0  Trouble relaxing 0 0 0  Restless 0 0 0  Easily annoyed or irritable 0 0 0  Afraid - awful might happen 0 0 0  Total GAD 7 Score 0 2 0    Upstream - 07/28/23 1031       Pregnancy Intention Screening   Does the patient want to become pregnant in the next year? N/A  Does the patient's partner want to become pregnant in the next year? N/A    Would the patient like to discuss contraceptive options today? N/A      Contraception Wrap Up   Current Method Hormonal Injection    Reason for No Current Contraceptive Method at Intake ([facility] Only) Other   ablation   End Method Hormonal Injection    Contraception Counseling Provided No            Examination chaperoned by Faith Rogue LPN    Impression and Plan: 1. Encounter for well woman exam with routine gynecological exam Physical in 1 year Pap in 2026 To get labs with PCP  2. Encounter for surveillance of injectable contraceptive Has refills on depo   3. Encounter for screening fecal occult blood testing Hemoccult was negative - POCT  occult blood stool  4. Screening mammogram for breast cancer Had negative mammogram 11/05/21 pt to call and schedule - MM 3D SCREENING MAMMOGRAM BILATERAL BREAST; Future

## 2023-08-09 ENCOUNTER — Ambulatory Visit
Admission: RE | Admit: 2023-08-09 | Discharge: 2023-08-09 | Disposition: A | Discharge status: Home / Self Care | Payer: BC Managed Care – PPO | Source: Ambulatory Visit | Attending: Adult Health | Admitting: Adult Health

## 2023-08-09 DIAGNOSIS — Z1231 Encounter for screening mammogram for malignant neoplasm of breast: Secondary | ICD-10-CM | POA: Diagnosis not present

## 2023-08-21 NOTE — Patient Instructions (Signed)

## 2023-08-21 NOTE — Progress Notes (Unsigned)
New Patient Office Visit   Subjective   Patient ID: Karla Ward, female    DOB: 13-Sep-1971  Age: 52 y.o. MRN: [id number]  CC: No chief complaint on file.   HPI Karla Ward 52 year old female, presents to establish care. She  has a past medical history of Anemia, Dizziness, Gastritis, GERD (gastroesophageal reflux disease), Hypertension, Iron deficiency, Meniere disease, Status post dilation of esophageal narrowing, Vertigo, and Vitamin D deficiency.  HPI    Outpatient Encounter Medications as of 08/22/2023  Medication Sig   albuterol (VENTOLIN HFA) 108 (90 Base) MCG/ACT inhaler Inhale 2 puffs into the lungs every 4 (four) hours as needed for wheezing or shortness of breath.   Albuterol Sulfate, sensor, (PROAIR DIGIHALER) 108 (90 Base) MCG/ACT AEPB Inhale into the lungs as needed.   amLODipine (NORVASC) 5 MG tablet Take 5 mg by mouth daily.   Ascorbic Acid (VITAMIN C PO) Take by mouth daily.   clotrimazole-betamethasone (LOTRISONE) cream APPLY TO AFFECTED AREA TWICE A DAY   fluticasone (FLONASE) 50 MCG/ACT nasal spray Place 2 sprays into both nostrils as needed.   meclizine (ANTIVERT) 25 MG tablet Take 50 mg by mouth 2 (two) times daily as needed.   medroxyPROGESTERone (DEPO-PROVERA) 150 MG/ML injection INJECT 1 ML (150 MG TOTAL) INTO THE MUSCLE EVERY 3 (THREE) MONTHS   ondansetron (ZOFRAN) 4 MG tablet Take 4 mg by mouth as needed.   pantoprazole (PROTONIX) 40 MG tablet Take 1 tablet (40 mg total) by mouth daily. 30 minutes before eating.   sucralfate (CARAFATE) 1 GM/10ML suspension Take 10 mLs (1 g total) by mouth 4 (four) times daily -  with meals and at bedtime for 14 days.   VITAMIN D PO Take by mouth daily.   No facility-administered encounter medications on file as of 08/22/2023.    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  Physical Exam    Assessment & Plan:  There are no diagnoses linked to this encounter.  No follow-ups on file.   Karla Lederer Newman Nip, FNP

## 2023-08-22 ENCOUNTER — Encounter: Payer: Self-pay | Admitting: Family Medicine

## 2023-08-22 ENCOUNTER — Ambulatory Visit: Payer: BC Managed Care – PPO | Admitting: Family Medicine

## 2023-08-22 VITALS — BP 130/72 | HR 83 | Ht 64.0 in | Wt 160.1 lb

## 2023-08-22 DIAGNOSIS — R7301 Impaired fasting glucose: Secondary | ICD-10-CM | POA: Diagnosis not present

## 2023-08-22 DIAGNOSIS — E559 Vitamin D deficiency, unspecified: Secondary | ICD-10-CM | POA: Diagnosis not present

## 2023-08-22 DIAGNOSIS — Z1159 Encounter for screening for other viral diseases: Secondary | ICD-10-CM | POA: Diagnosis not present

## 2023-08-22 DIAGNOSIS — Z114 Encounter for screening for human immunodeficiency virus [HIV]: Secondary | ICD-10-CM

## 2023-08-22 DIAGNOSIS — Z1211 Encounter for screening for malignant neoplasm of colon: Secondary | ICD-10-CM

## 2023-08-22 DIAGNOSIS — I10 Essential (primary) hypertension: Secondary | ICD-10-CM | POA: Diagnosis not present

## 2023-08-22 DIAGNOSIS — E038 Other specified hypothyroidism: Secondary | ICD-10-CM | POA: Diagnosis not present

## 2023-08-22 DIAGNOSIS — R002 Palpitations: Secondary | ICD-10-CM

## 2023-08-22 MED ORDER — FLUTICASONE PROPIONATE 50 MCG/ACT NA SUSP: 2.0000 | NASAL | 3 refills | Status: DC | PRN

## 2023-08-22 MED ORDER — AMLODIPINE BESYLATE 5 MG PO TABS: 5.0000 mg | ORAL_TABLET | Freq: Every day | ORAL | 3 refills | Status: DC

## 2023-08-22 MED ORDER — ONDANSETRON HCL 4 MG PO TABS: 4.0000 mg | ORAL_TABLET | ORAL | 3 refills | Status: DC | PRN

## 2023-08-22 MED ORDER — MECLIZINE HCL 25 MG PO TABS: 50.0000 mg | ORAL_TABLET | Freq: Two times a day (BID) | ORAL | 2 refills | Status: DC | PRN

## 2023-08-22 NOTE — Assessment & Plan Note (Addendum)
Continued Palpations Patient requesting to see a different cardiologist Advise patient can be related to anxiety, Discussed to avoid or limit caffeine, alcohol, smoking. Try relaxation techniques such as mediatation, yoga or deep breathing

## 2023-08-22 NOTE — Assessment & Plan Note (Signed)
Vitals:   08/22/23 0815  BP: 130/72  Continue Amlodipine 5 mg once daily Labs ordered. Discussed with  patient to monitor their blood pressure regularly and maintain a heart-healthy diet rich in fruits, vegetables, whole grains, and low-fat dairy, while reducing sodium intake to less than 2,300 mg per day. Regular physical activity, such as 30 minutes of moderate exercise most days of the week, will help lower blood pressure and improve overall cardiovascular health. Avoiding smoking, limiting alcohol consumption, and managing stress. Take  prescribed medication, & take it as directed and avoid skipping doses. Seek emergency care if your blood pressure is (over 180/100) or you experience chest pain, shortness of breath, or sudden vision changes.Patient verbalizes understanding regarding plan of care and all questions answered.

## 2023-08-24 LAB — CBC WITH DIFFERENTIAL/PLATELET
Basophils Absolute: 0.1 10*3/uL (ref 0.0–0.2)
Basos: 1 %
EOS (ABSOLUTE): 0.5 10*3/uL — ABNORMAL HIGH (ref 0.0–0.4)
Eos: 7 %
Hematocrit: 43 % (ref 34.0–46.6)
Hemoglobin: 14.1 g/dL (ref 11.1–15.9)
Immature Grans (Abs): 0 10*3/uL (ref 0.0–0.1)
Immature Granulocytes: 0 %
Lymphocytes Absolute: 1.8 10*3/uL (ref 0.7–3.1)
Lymphs: 27 %
MCH: 28.7 pg (ref 26.6–33.0)
MCHC: 32.8 g/dL (ref 31.5–35.7)
MCV: 88 fL (ref 79–97)
Monocytes Absolute: 0.4 10*3/uL (ref 0.1–0.9)
Monocytes: 6 %
Neutrophils Absolute: 4 10*3/uL (ref 1.4–7.0)
Neutrophils: 59 %
Platelets: 329 10*3/uL (ref 150–450)
RBC: 4.91 x10E6/uL (ref 3.77–5.28)
RDW: 13.3 % (ref 11.7–15.4)
WBC: 6.9 10*3/uL (ref 3.4–10.8)

## 2023-08-24 LAB — CMP14+EGFR
ALT: 10 [IU]/L (ref 0–32)
AST: 16 [IU]/L (ref 0–40)
Albumin: 4.2 g/dL (ref 3.9–4.9)
Alkaline Phosphatase: 84 IU/L (ref 44–121)
BUN/Creatinine Ratio: 7 — ABNORMAL LOW (ref 9–23)
BUN: 7 mg/dL (ref 6–24)
Bilirubin Total: 0.5 mg/dL (ref 0.0–1.2)
CO2: 18 mmol/L — ABNORMAL LOW (ref 20–29)
Calcium: 9.6 mg/dL (ref 8.7–10.2)
Chloride: 107 mmol/L — ABNORMAL HIGH (ref 96–106)
Creatinine, Ser: 0.99 mg/dL (ref 0.57–1.00)
Globulin, Total: 2.9 g/dL (ref 1.5–4.5)
Glucose: 87 mg/dL (ref 70–99)
Potassium: 3.8 mmol/L (ref 3.5–5.2)
Sodium: 140 mmol/L (ref 134–144)
Total Protein: 7.1 g/dL (ref 6.0–8.5)
eGFR: 69 mL/min/{1.73_m2} (ref >59)

## 2023-08-24 LAB — HIV ANTIBODY (ROUTINE TESTING W REFLEX): HIV Screen 4th Generation wRfx: NONREACTIVE

## 2023-08-24 LAB — TSH+FREE T4
Free T4: 1.11 ng/dL (ref 0.82–1.77)
TSH: 1.57 u[IU]/mL (ref 0.450–4.500)

## 2023-08-24 LAB — LIPID PANEL
Chol/HDL Ratio: 3.4 ratio (ref 0.0–4.4)
Cholesterol, Total: 201 mg/dL — ABNORMAL HIGH (ref 100–199)
HDL: 59 mg/dL (ref >39)
LDL Chol Calc (NIH): 129 mg/dL — ABNORMAL HIGH (ref 0–99)
Triglycerides: 70 mg/dL (ref 0–149)
VLDL Cholesterol Cal: 13 mg/dL (ref 5–40)

## 2023-08-24 LAB — HEPATITIS C ANTIBODY: Hep C Virus Ab: NONREACTIVE

## 2023-08-24 LAB — MICROALBUMIN / CREATININE URINE RATIO
Creatinine, Urine: 155.9 mg/dL
Microalb/Creat Ratio: 18 mg/g{creat} (ref 0–29)
Microalbumin, Urine: 27.4 ug/mL

## 2023-08-24 LAB — VITAMIN D 25 HYDROXY (VIT D DEFICIENCY, FRACTURES): Vit D, 25-Hydroxy: 20 ng/mL — ABNORMAL LOW (ref 30.0–100.0)

## 2023-08-24 LAB — HEMOGLOBIN A1C
Est. average glucose Bld gHb Est-mCnc: 114 mg/dL
Hgb A1c MFr Bld: 5.6 % (ref 4.8–5.6)

## 2023-09-05 ENCOUNTER — Telehealth: Payer: Self-pay | Admitting: Family Medicine

## 2023-09-05 NOTE — Telephone Encounter (Signed)
[facility] called in on patient behalf   Needs clarification on patient medication   Needs med frequency max daily amount   Wants a cll back

## 2023-09-08 NOTE — Telephone Encounter (Signed)
Contacted pharmacy confirmed dosage and quantity with Robyn.

## 2023-09-27 ENCOUNTER — Ambulatory Visit: Payer: BC Managed Care – PPO | Admitting: *Deleted

## 2023-09-27 DIAGNOSIS — Z3042 Encounter for surveillance of injectable contraceptive: Secondary | ICD-10-CM

## 2023-09-27 MED ORDER — MEDROXYPROGESTERONE ACETATE 150 MG/ML IM SUSY
150.0000 mg | PREFILLED_SYRINGE | Freq: Once | INTRAMUSCULAR | Status: AC
  Administered 2023-09-27: 150 mg via INTRAMUSCULAR

## 2023-09-27 NOTE — Progress Notes (Signed)
   NURSE VISIT- INJECTION  SUBJECTIVE:  Karla Ward is a 52 y.o. [phone] female here for a Depo Provera for contraception/period management. She is a GYN patient.   OBJECTIVE:  There were no vitals taken for this visit.  Appears well, in no apparent distress  Injection administered in: Left deltoid  Meds ordered this encounter  Medications   medroxyPROGESTERone Acetate SUSY 150 mg    ASSESSMENT: GYN patient Depo Provera for contraception/period management PLAN: Follow-up: in 11-13 weeks for next Depo   Malachy Mood  09/27/2023 4:21 PM

## 2023-10-19 ENCOUNTER — Encounter: Payer: Self-pay | Admitting: Gastroenterology

## 2023-11-14 ENCOUNTER — Other Ambulatory Visit: Payer: Self-pay | Admitting: Family Medicine

## 2023-11-16 ENCOUNTER — Telehealth: Payer: Self-pay | Admitting: Family Medicine

## 2023-11-16 ENCOUNTER — Other Ambulatory Visit: Payer: Self-pay | Admitting: Family Medicine

## 2023-11-16 MED ORDER — ALBUTEROL SULFATE HFA 108 (90 BASE) MCG/ACT IN AERS: 2.0000 | INHALATION_SPRAY | RESPIRATORY_TRACT | 3 refills | Status: DC | PRN

## 2023-11-16 NOTE — Telephone Encounter (Signed)
Prescription Request  11/16/2023  LOV: 08/22/2023  What is the name of the medication or equipment? albuterol (VENTOLIN HFA) 108 (90 Base) MCG/ACT inhaler  No longer get from other pcp  Have you contacted your pharmacy to request a refill? Yes   Which pharmacy would you like this sent to?  [facility] - [place], [place] - [address] [place] [address] Phone: [phone] Fax: [phone]    Patient notified that their request is being sent to the clinical staff for review and that they should receive a response within 2 business days.   Please advise at  walked in

## 2023-11-16 NOTE — Telephone Encounter (Signed)
sent 

## 2023-11-23 DIAGNOSIS — E7849 Other hyperlipidemia: Secondary | ICD-10-CM | POA: Diagnosis not present

## 2023-11-23 DIAGNOSIS — I10 Essential (primary) hypertension: Secondary | ICD-10-CM | POA: Diagnosis not present

## 2023-11-23 DIAGNOSIS — R062 Wheezing: Secondary | ICD-10-CM | POA: Diagnosis not present

## 2023-11-23 DIAGNOSIS — R002 Palpitations: Secondary | ICD-10-CM | POA: Diagnosis not present

## 2023-11-23 DIAGNOSIS — Z1331 Encounter for screening for depression: Secondary | ICD-10-CM | POA: Diagnosis not present

## 2023-12-11 ENCOUNTER — Other Ambulatory Visit (HOSPITAL_COMMUNITY): Payer: Self-pay | Admitting: Radiology

## 2023-12-11 DIAGNOSIS — R062 Wheezing: Secondary | ICD-10-CM

## 2023-12-12 NOTE — Progress Notes (Unsigned)
   Established Patient Office Visit   Subjective  Patient ID: Karla Ward, female    DOB: 1971/09/22  Age: 53 y.o. MRN: [id number]  No chief complaint on file.   She  has a past medical history of Anemia, Dizziness, Gastritis, GERD (gastroesophageal reflux disease), Hypertension, Iron deficiency, Meniere disease, Status post dilation of esophageal narrowing, Vertigo, and Vitamin D deficiency.  HPI  ROS    Objective:     There were no vitals taken for this visit. {Vitals History (Optional):23777}  Physical Exam   No results found for any visits on 12/13/23.  The 10-year ASCVD risk score (Arnett DK, et al., 2019) is: 13.5%    Assessment & Plan:  There are no diagnoses linked to this encounter.  No follow-ups on file.   Cruzita Lederer Newman Nip, FNP

## 2023-12-12 NOTE — Patient Instructions (Signed)

## 2023-12-13 ENCOUNTER — Other Ambulatory Visit (HOSPITAL_COMMUNITY): Payer: Self-pay | Admitting: Cardiology

## 2023-12-13 ENCOUNTER — Encounter: Payer: Self-pay | Admitting: Family Medicine

## 2023-12-13 ENCOUNTER — Ambulatory Visit: Payer: BC Managed Care – PPO | Admitting: Family Medicine

## 2023-12-13 VITALS — BP 120/78 | HR 85 | Ht 64.0 in | Wt 182.0 lb

## 2023-12-13 DIAGNOSIS — E559 Vitamin D deficiency, unspecified: Secondary | ICD-10-CM

## 2023-12-13 DIAGNOSIS — I10 Essential (primary) hypertension: Secondary | ICD-10-CM | POA: Diagnosis not present

## 2023-12-13 DIAGNOSIS — R399 Unspecified symptoms and signs involving the genitourinary system: Secondary | ICD-10-CM

## 2023-12-13 DIAGNOSIS — E538 Deficiency of other specified B group vitamins: Secondary | ICD-10-CM

## 2023-12-13 DIAGNOSIS — R350 Frequency of micturition: Secondary | ICD-10-CM

## 2023-12-13 DIAGNOSIS — R062 Wheezing: Secondary | ICD-10-CM

## 2023-12-13 NOTE — Assessment & Plan Note (Signed)
Vitals:   12/13/23 0812  BP: 120/78  Controlled, Continue Amlodipine 5 mg once daily  Labs ordered. Discussed with  patient to monitor their blood pressure regularly and maintain a heart-healthy diet rich in fruits, vegetables, whole grains, and low-fat dairy, while reducing sodium intake to less than 2,300 mg per day. Regular physical activity, such as 30 minutes of moderate exercise most days of the week, will help lower blood pressure and improve overall cardiovascular health. Avoiding smoking, limiting alcohol consumption, and managing stress. Take  prescribed medication, & take it as directed and avoid skipping doses. Seek emergency care if your blood pressure is (over 180/100) or you experience chest pain, shortness of breath, or sudden vision changes.Patient verbalizes understanding regarding plan of care and all questions answered.

## 2023-12-13 NOTE — Addendum Note (Signed)
Addended by: Burt Ek on: 12/13/2023 09:12 AM   Modules accepted: Orders

## 2023-12-13 NOTE — Assessment & Plan Note (Signed)
Possible OAB, advise to follow up with Urology if symptoms persist. Urinalysis, NuSwab and urine culture ordered- Awaiting results will follow up. Discussed to  urinate frequently and avoid holding your urine. After using the bathroom, always wipe from front to back to prevent bacteria from spreading. Avoid irritants like caffeine, alcohol, spicy foods, and artificial sweeteners, as they can aggravate your bladder. Wearing loose, breathable clothing, especially cotton underwear, can help keep the area dry.

## 2023-12-14 ENCOUNTER — Encounter: Payer: Self-pay | Admitting: Family Medicine

## 2023-12-14 LAB — LIPID PANEL
Chol/HDL Ratio: 3.6 {ratio} (ref 0.0–4.4)
Cholesterol, Total: 203 mg/dL — ABNORMAL HIGH (ref 100–199)
HDL: 57 mg/dL (ref >39)
LDL Chol Calc (NIH): 134 mg/dL — ABNORMAL HIGH (ref 0–99)
Triglycerides: 67 mg/dL (ref 0–149)
VLDL Cholesterol Cal: 12 mg/dL (ref 5–40)

## 2023-12-14 LAB — CBC WITH DIFFERENTIAL/PLATELET
Basophils Absolute: 0.1 10*3/uL (ref 0.0–0.2)
Basos: 1 %
EOS (ABSOLUTE): 0.4 10*3/uL (ref 0.0–0.4)
Eos: 6 %
Hematocrit: 42.5 % (ref 34.0–46.6)
Hemoglobin: 14.2 g/dL (ref 11.1–15.9)
Immature Grans (Abs): 0 10*3/uL (ref 0.0–0.1)
Immature Granulocytes: 0 %
Lymphocytes Absolute: 2 10*3/uL (ref 0.7–3.1)
Lymphs: 27 %
MCH: 28.8 pg (ref 26.6–33.0)
MCHC: 33.4 g/dL (ref 31.5–35.7)
MCV: 86 fL (ref 79–97)
Monocytes Absolute: 0.4 10*3/uL (ref 0.1–0.9)
Monocytes: 6 %
Neutrophils Absolute: 4.4 10*3/uL (ref 1.4–7.0)
Neutrophils: 60 %
Platelets: 331 10*3/uL (ref 150–450)
RBC: 4.93 x10E6/uL (ref 3.77–5.28)
RDW: 12.5 % (ref 11.7–15.4)
WBC: 7.3 10*3/uL (ref 3.4–10.8)

## 2023-12-15 ENCOUNTER — Encounter: Payer: Self-pay | Admitting: Family Medicine

## 2023-12-15 LAB — BMP8+EGFR
BUN/Creatinine Ratio: 7 — ABNORMAL LOW (ref 9–23)
BUN: 8 mg/dL (ref 6–24)
CO2: 17 mmol/L — ABNORMAL LOW (ref 20–29)
Calcium: 10 mg/dL (ref 8.7–10.2)
Chloride: 105 mmol/L (ref 96–106)
Creatinine, Ser: 1.07 mg/dL — ABNORMAL HIGH (ref 0.57–1.00)
Glucose: 89 mg/dL (ref 70–99)
Potassium: 4.5 mmol/L (ref 3.5–5.2)
Sodium: 141 mmol/L (ref 134–144)
eGFR: 63 mL/min/{1.73_m2} (ref >59)

## 2023-12-15 LAB — URINE CULTURE

## 2023-12-15 LAB — VITAMIN D 25 HYDROXY (VIT D DEFICIENCY, FRACTURES): Vit D, 25-Hydroxy: 21.1 ng/mL — ABNORMAL LOW (ref 30.0–100.0)

## 2023-12-15 LAB — SPECIMEN STATUS REPORT

## 2023-12-15 LAB — VITAMIN B12: Vitamin B-12: 435 pg/mL (ref 232–1245)

## 2023-12-17 LAB — NUSWAB VAGINITIS PLUS (VG+)
Candida albicans, NAA: NEGATIVE
Candida glabrata, NAA: NEGATIVE

## 2023-12-18 DIAGNOSIS — R002 Palpitations: Secondary | ICD-10-CM | POA: Diagnosis not present

## 2023-12-18 DIAGNOSIS — I10 Essential (primary) hypertension: Secondary | ICD-10-CM | POA: Diagnosis not present

## 2023-12-18 DIAGNOSIS — E7849 Other hyperlipidemia: Secondary | ICD-10-CM | POA: Diagnosis not present

## 2023-12-18 DIAGNOSIS — R062 Wheezing: Secondary | ICD-10-CM | POA: Diagnosis not present

## 2023-12-20 ENCOUNTER — Ambulatory Visit: Payer: BC Managed Care – PPO | Admitting: *Deleted

## 2023-12-20 DIAGNOSIS — Z3042 Encounter for surveillance of injectable contraceptive: Secondary | ICD-10-CM | POA: Diagnosis not present

## 2023-12-20 MED ORDER — MEDROXYPROGESTERONE ACETATE 150 MG/ML IM SUSY
150.0000 mg | PREFILLED_SYRINGE | Freq: Once | INTRAMUSCULAR | Status: AC
Start: 2023-12-20 — End: 2023-12-20
  Administered 2023-12-20: 150 mg via INTRAMUSCULAR

## 2023-12-20 NOTE — Progress Notes (Signed)
   NURSE VISIT- INJECTION  SUBJECTIVE:  Karla Ward is a 53 y.o. [phone] female here for a Depo Provera for contraception/period management. She is a GYN patient.   OBJECTIVE:  There were no vitals taken for this visit.  Appears well, in no apparent distress  Injection administered in: Right deltoid  Meds ordered this encounter  Medications   medroxyPROGESTERone Acetate SUSY 150 mg    ASSESSMENT: GYN patient Depo Provera for contraception/period management PLAN: Follow-up: in 11-13 weeks for next Depo. Pt needs refills on Depo.    Malachy Mood  12/20/2023 12:00 PM

## 2024-01-11 ENCOUNTER — Ambulatory Visit (HOSPITAL_COMMUNITY)
Admission: RE | Admit: 2024-01-11 | Discharge: 2024-01-11 | Disposition: A | Discharge status: Home / Self Care | Source: Ambulatory Visit | Attending: Cardiology | Admitting: Cardiology

## 2024-01-11 DIAGNOSIS — R062 Wheezing: Secondary | ICD-10-CM | POA: Diagnosis not present

## 2024-01-13 DIAGNOSIS — Z1211 Encounter for screening for malignant neoplasm of colon: Secondary | ICD-10-CM | POA: Diagnosis not present

## 2024-01-17 DIAGNOSIS — R002 Palpitations: Secondary | ICD-10-CM | POA: Diagnosis not present

## 2024-01-17 DIAGNOSIS — I10 Essential (primary) hypertension: Secondary | ICD-10-CM | POA: Diagnosis not present

## 2024-01-17 DIAGNOSIS — E7849 Other hyperlipidemia: Secondary | ICD-10-CM | POA: Diagnosis not present

## 2024-01-17 DIAGNOSIS — R062 Wheezing: Secondary | ICD-10-CM | POA: Diagnosis not present

## 2024-01-19 LAB — COLOGUARD: COLOGUARD: NEGATIVE

## 2024-01-31 DIAGNOSIS — I10 Essential (primary) hypertension: Secondary | ICD-10-CM | POA: Diagnosis not present

## 2024-01-31 DIAGNOSIS — E7849 Other hyperlipidemia: Secondary | ICD-10-CM | POA: Diagnosis not present

## 2024-01-31 DIAGNOSIS — R002 Palpitations: Secondary | ICD-10-CM | POA: Diagnosis not present

## 2024-01-31 DIAGNOSIS — R062 Wheezing: Secondary | ICD-10-CM | POA: Diagnosis not present

## 2024-02-06 ENCOUNTER — Telehealth: Payer: Self-pay | Admitting: Family Medicine

## 2024-02-06 NOTE — Telephone Encounter (Signed)
 Wants for you to take a look at her chest xray. States she was told by cardiology that it was abnormal and she needed to reach out to her pcp

## 2024-02-06 NOTE — Telephone Encounter (Signed)
 Copied from CRM [id number]. Topic: Clinical - Medical Advice >> Feb 02, 2024  3:48 PM Shon Hale wrote: Reason for CRM: Patient had xray done w/ Cardiologist Houston Siren, Erma Heritage, MD), Xray done at [facility]. Pt spoke to nurse from cardiology and was informed that xray came back irregular and pt needed to get in touch w/ PCP. Requesting provider to take a look at xray as well.   Please assist pt further >> Feb 06, 2024 11:08 AM Fonda Kinder J wrote: Pt called in to f/u on this encounter and to see if her pcp had received her message. I contacted CAL to see if the provider was in office and was notified that she would be back in tomorrow, I let the pt know she had been out and she would get the message tomorrow when she returns

## 2024-02-07 ENCOUNTER — Encounter: Payer: Self-pay | Admitting: Family Medicine

## 2024-02-07 DIAGNOSIS — R002 Palpitations: Secondary | ICD-10-CM | POA: Diagnosis not present

## 2024-02-07 DIAGNOSIS — I10 Essential (primary) hypertension: Secondary | ICD-10-CM | POA: Diagnosis not present

## 2024-02-07 DIAGNOSIS — I471 Supraventricular tachycardia, unspecified: Secondary | ICD-10-CM | POA: Diagnosis not present

## 2024-02-07 NOTE — Telephone Encounter (Signed)
 Reviewed comments left on my chart for patient to read

## 2024-02-08 ENCOUNTER — Telehealth: Payer: Self-pay

## 2024-02-08 NOTE — Telephone Encounter (Signed)
 Patient can send me a message on my chart

## 2024-02-08 NOTE — Telephone Encounter (Signed)
 Provider message via MyChart viewed by patient on 02/07/24. Patient would like to discuss further. Please advise.

## 2024-02-08 NOTE — Telephone Encounter (Signed)
 Copied from CRM [id number]. Topic: General - Other >> Feb 07, 2024  1:20 PM Eunice Blase wrote: Reason for CRM: Pt called would like a call back from PCP regarding pt's Xray results. Please call pt at [phone].

## 2024-02-13 ENCOUNTER — Encounter (HOSPITAL_COMMUNITY)

## 2024-02-13 NOTE — Telephone Encounter (Signed)
 Noted.

## 2024-02-19 DIAGNOSIS — N3000 Acute cystitis without hematuria: Secondary | ICD-10-CM | POA: Diagnosis not present

## 2024-02-20 DIAGNOSIS — N3001 Acute cystitis with hematuria: Secondary | ICD-10-CM | POA: Diagnosis not present

## 2024-02-20 DIAGNOSIS — R300 Dysuria: Secondary | ICD-10-CM | POA: Diagnosis not present

## 2024-02-21 ENCOUNTER — Ambulatory Visit (INDEPENDENT_AMBULATORY_CARE_PROVIDER_SITE_OTHER): Admitting: Gastroenterology

## 2024-02-21 VITALS — BP 132/83 | HR 82 | Temp 97.7°F | Ht 64.0 in | Wt 187.0 lb

## 2024-02-21 DIAGNOSIS — K5900 Constipation, unspecified: Secondary | ICD-10-CM

## 2024-02-21 DIAGNOSIS — K219 Gastro-esophageal reflux disease without esophagitis: Secondary | ICD-10-CM | POA: Diagnosis not present

## 2024-02-21 MED ORDER — PANTOPRAZOLE SODIUM 40 MG PO TBEC: 40.0000 mg | DELAYED_RELEASE_TABLET | Freq: Every day | ORAL | 3 refills | Status: DC

## 2024-02-21 NOTE — Patient Instructions (Signed)
 I have refilled pantoprazole  for you.  We will see you in 1 year or sooner as needed!  I enjoyed seeing you again today! I value our relationship and want to provide genuine, compassionate, and quality care. You may receive a survey regarding your visit with me, and I welcome your feedback! Thanks so much for taking the time to complete this. I look forward to seeing you again.      Delman Ferns, PhD, ANP-BC [facility]

## 2024-02-21 NOTE — Progress Notes (Signed)
 Gastroenterology Office Note     Primary Care Physician:  Rosanna Comment, FNP  Primary Gastroenterologist: Dr. Mordechai April    Chief Complaint   Chief Complaint  Patient presents with   Gastroesophageal Reflux    Follow up on GERD. Takes protonix  daily and carafate  as needed but has not needed in awhile. States doing well. No concerns.      History of Present Illness   Karla Ward is a 53 y.o. female presenting today with a history of GERD, dyspepsia, constipation, last seen in July 2024 for routine visit. Cologuard recently completed 12/2023 and negative. Here for routine follow-up.  Using Benefiber. Drinking more water . BM about 1-2 times per day. Now at least one BM every day. No abdominal pain.   Red sauce will trigger GERD, along with peanut butter. Taking pantoprazole  daily and carafate  prn. Haven't used in quite a while. No dysphagia. No overt GI bleeding. No sodas. May drink a ginger ale.   US  Sept 2023: no gallstones.    EGD 2016: mild non-erosive gastritis/stricture at the gastroesophageal junction    No prior colonoscopy. Cologuard negative March 2025     Past Medical History:  Diagnosis Date   Anemia    Dizziness    Gastritis    GERD (gastroesophageal reflux disease)    Hypertension    Iron deficiency    Meniere disease    Status post dilation of esophageal narrowing    Vertigo    Vitamin D  deficiency     Past Surgical History:  Procedure Laterality Date   DILITATION & CURRETTAGE/HYSTROSCOPY WITH NOVASURE ABLATION N/A 04/05/2017   Procedure: DILATATION & CURETTAGE/HYSTEROSCOPY WITH NOVASURE ENDOMETRIAL ABLATION;  Surgeon: Wendelyn Halter, MD;  Location: [facility];  Service: Gynecology;  Laterality: N/A;   ESOPHAGOGASTRODUODENOSCOPY N/A 09/02/2015   ZOX:WRUE non-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 18 g 3   Albuterol  Sulfate, sensor, (PROAIR  DIGIHALER) 108 (90 Base) MCG/ACT AEPB Inhale into the lungs as needed.     amLODipine  (NORVASC ) 5 MG tablet Take 1 tablet (5 mg total) by mouth daily. 30 tablet 3   Ascorbic Acid (VITAMIN C PO) Take by mouth daily.     clotrimazole -betamethasone  (LOTRISONE ) cream APPLY TO AFFECTED AREA TWICE A DAY 30 g 2   fluticasone  (FLONASE ) 50 MCG/ACT nasal spray Place 2 sprays into both nostrils as needed. 18.2 mL 3   meclizine  (ANTIVERT ) 25 MG tablet Take 2 tablets (50 mg total) by mouth 2 (two) times daily as needed. 30 tablet 2   medroxyPROGESTERone  (DEPO-PROVERA ) 150 MG/ML injection INJECT 1 ML (150 MG TOTAL) INTO THE MUSCLE EVERY 3 (THREE) MONTHS 1 mL 3   ondansetron  (ZOFRAN ) 4 MG tablet Take 1 tablet (4 mg total) by mouth as needed. 20 tablet 3   pantoprazole  (PROTONIX ) 40 MG tablet Take 1 tablet (40 mg total) by mouth daily. 30 minutes before eating. 90 tablet 3   sucralfate  (CARAFATE ) 1 GM/10ML suspension Take 10 mLs (1 g total) by mouth 4 (four) times daily -  with meals and at bedtime for 14  days. 560 mL 0   VITAMIN D  PO Take by mouth daily.     No current facility-administered medications for this visit.    Allergies as of 02/21/2024 - Review Complete 02/21/2024  Allergen Reaction Noted   Benadryl  [diphenhydramine  hcl (sleep)]  12/26/2014   Ibuprofen   04/29/2021   Penicillins Hives 11/19/2012   Vitamin d  analogs Other (See Comments) 04/29/2021   Iodinated contrast media Rash 09/09/2022    Family History  Problem Relation Age of Onset   Hypertension Mother    Throat cancer Father 6   Hypertension Father    Congestive Heart Failure Sister    Colon cancer Neg Hx    Colon  polyps Neg Hx     Social History   Socioeconomic History   Marital status: Married    Spouse name: Arthor Laser   Number of children: 2   Years of education: Not on file   Highest education level: Associate degree: academic program  Occupational History   Occupation: CNA    Employer: [employer]    Comment: [employer]  Tobacco Use   Smoking status: Never   Smokeless tobacco: Never  Vaping Use   Vaping status: Never Used  Substance and Sexual Activity   Alcohol use: No   Drug use: No   Sexual activity: Yes    Birth control/protection: Surgical, Injection    Comment: ablation  Other Topics Concern   Not on file  Social History Narrative   Lives with husband   Right handed   Caffeine- some    Social Drivers of Health   Financial Resource Strain: Low Risk  (11/23/2023)   Received from [facility]   Overall Financial Resource Strain (CARDIA)    Difficulty of Paying Living Expenses: Not hard at all  Food Insecurity: No Food Insecurity (02/20/2024)   Received from [facility]   Hunger Vital Sign    Worried About Running Out of Food in the Last Year: Never true    Ran Out of Food in the Last Year: Never true  Transportation Needs: No Transportation Needs (02/20/2024)   Received from [facility]   PRAPARE - Transportation    Lack of Transportation (Medical): No    Lack of Transportation (Non-Medical): No  Physical Activity: Insufficiently Active (07/28/2023)   Exercise Vital Sign    Days of Exercise per Week: 2 days    Minutes of Exercise per Session: 30 min  Stress: No Stress Concern Present (07/28/2023)   [employer] of Occupational Health - Occupational Stress Questionnaire    Feeling of Stress : Only a little  Social Connections: Unknown (09/04/2023)   Received from [facility]   Social Network    Social Network: Not on file  Intimate Partner Violence: Unknown (09/04/2023)   Received from [facility]   HITS    Physically Hurt:  Not on file    Insult or Talk Down To: Not on file    Threaten Physical Harm: Not on file    Scream or Curse: Not on file     Review of Systems   Gen: Denies any fever, chills, fatigue, weight loss, lack of appetite.  CV: Denies chest pain, heart palpitations, peripheral edema, syncope.  Resp: Denies shortness of breath at rest or with exertion. Denies wheezing or cough.  GI: Denies dysphagia or odynophagia. Denies jaundice, hematemesis, fecal incontinence. GU : Denies urinary burning, urinary frequency, urinary hesitancy MS: Denies joint pain, muscle weakness, cramps, or limitation of movement.  Derm: Denies rash, itching, dry skin Psych: Denies depression, anxiety, memory loss, and confusion Heme: Denies bruising, bleeding, and enlarged lymph nodes.   Physical Exam   BP 132/83   Pulse 82   Temp 97.7 F (36.5 C)   Ht 5\' 4"  (1.626 m)   Wt 187 lb (84.8 kg)   BMI 32.10 kg/m  General:   Alert and oriented. Pleasant and cooperative. Well-nourished and well-developed.  Head:  Normocephalic and atraumatic. Eyes:  Without icterus Abdomen:  +BS, soft, non-tender and non-distended. No HSM noted. No guarding or rebound. No masses appreciated.  Rectal:  Deferred  Msk:  Symmetrical without gross deformities. Normal posture. Extremities:  Without edema. Neurologic:  Alert and  oriented x4;  grossly normal neurologically. Skin:  Intact without significant lesions or rashes. Psych:  Alert and cooperative. Normal mood and affect.   Assessment/Plan:    Karla Ward is a 53 y.o. female presenting today with a history of GERD, dyspepsia, constipation, last seen in July 2024 for routine visit. Cologuard recently completed 12/2023 and negative. Here for routine follow-up.  Constipation is well-managed with using Benefiber daily and increasing water  intake.   GERD well managed with pantoprazole . No alarm signs/symptoms.  Cologuard negative March 2025. No prior colonoscopy. She  prefers Cologuard.   As doing well, we will see her in 1 year or sooner if needed.      PLAN    Continue pantoprazole  once daily Return in 1 year or sooner if needed   Delman Ferns, PhD, ANP-BC [facility]

## 2024-03-12 ENCOUNTER — Other Ambulatory Visit: Payer: Self-pay | Admitting: Adult Health

## 2024-03-13 ENCOUNTER — Telehealth: Payer: Self-pay | Admitting: *Deleted

## 2024-03-13 ENCOUNTER — Ambulatory Visit: Payer: BC Managed Care – PPO

## 2024-03-13 NOTE — Telephone Encounter (Signed)
 Pt states she needs her Depo refilled. Please advise

## 2024-03-14 ENCOUNTER — Ambulatory Visit (INDEPENDENT_AMBULATORY_CARE_PROVIDER_SITE_OTHER): Admitting: *Deleted

## 2024-03-14 DIAGNOSIS — Z3042 Encounter for surveillance of injectable contraceptive: Secondary | ICD-10-CM

## 2024-03-14 MED ORDER — MEDROXYPROGESTERONE ACETATE 150 MG/ML IM SUSP
150.0000 mg | Freq: Once | INTRAMUSCULAR | Status: AC
  Administered 2024-03-14: 150 mg via INTRAMUSCULAR

## 2024-03-14 NOTE — Progress Notes (Signed)
   NURSE VISIT- INJECTION  SUBJECTIVE:  Lekeshia Huestis is a 53 y.o. [phone] female here for a Depo Provera  for contraception/period management. She is a GYN patient.   OBJECTIVE:  There were no vitals taken for this visit.  Appears well, in no apparent distress  Injection administered in: Left deltoid  Meds ordered this encounter  Medications   medroxyPROGESTERone  (DEPO-PROVERA ) injection 150 mg    ASSESSMENT: GYN patient Depo Provera  for contraception/period management PLAN: Follow-up: in 11-13 weeks for next Depo   Kerrie Peek  03/14/2024 8:50 AM

## 2024-03-21 ENCOUNTER — Telehealth (INDEPENDENT_AMBULATORY_CARE_PROVIDER_SITE_OTHER): Payer: Self-pay | Admitting: Gastroenterology

## 2024-03-21 NOTE — Telephone Encounter (Signed)
 Pt called in and would like samples of Miralax. Pt states she is going to [place]  in the morning and has not had a BM in a couple of days. Pt is wanting to know if she can get some samples like she did at last office visit. Miralax is not on pt med list currently. Please advise. Thank you!

## 2024-03-21 NOTE — Telephone Encounter (Signed)
 That is fine. May take one capful of Miralax daily prn.

## 2024-03-21 NOTE — Telephone Encounter (Signed)
 Pt contacted. Samples of single pack Miralax placed up front (5 single packs=5 doses). Also advised pt that she can use OTC Miralax after she has completed the samples. Pt verbalized understanding

## 2024-04-04 ENCOUNTER — Other Ambulatory Visit: Payer: Self-pay | Admitting: Gastroenterology

## 2024-04-09 ENCOUNTER — Other Ambulatory Visit: Payer: Self-pay | Admitting: Adult Health

## 2024-04-30 DIAGNOSIS — R3121 Asymptomatic microscopic hematuria: Secondary | ICD-10-CM | POA: Diagnosis not present

## 2024-04-30 DIAGNOSIS — N3000 Acute cystitis without hematuria: Secondary | ICD-10-CM | POA: Diagnosis not present

## 2024-05-31 ENCOUNTER — Ambulatory Visit: Payer: Self-pay

## 2024-06-05 DIAGNOSIS — N958 Other specified menopausal and perimenopausal disorders: Secondary | ICD-10-CM | POA: Diagnosis not present

## 2024-06-05 DIAGNOSIS — N898 Other specified noninflammatory disorders of vagina: Secondary | ICD-10-CM | POA: Diagnosis not present

## 2024-06-05 DIAGNOSIS — R102 Pelvic and perineal pain: Secondary | ICD-10-CM | POA: Diagnosis not present

## 2024-06-06 ENCOUNTER — Ambulatory Visit

## 2024-06-11 DIAGNOSIS — R3121 Asymptomatic microscopic hematuria: Secondary | ICD-10-CM | POA: Diagnosis not present

## 2024-06-12 ENCOUNTER — Ambulatory Visit: Payer: BC Managed Care – PPO | Admitting: Family Medicine

## 2024-06-13 ENCOUNTER — Ambulatory Visit

## 2024-06-13 DIAGNOSIS — Z3042 Encounter for surveillance of injectable contraceptive: Secondary | ICD-10-CM

## 2024-06-13 MED ORDER — MEDROXYPROGESTERONE ACETATE 150 MG/ML IM SUSP
150.0000 mg | Freq: Once | INTRAMUSCULAR | Status: AC
Start: 2024-06-13 — End: 2024-06-13
  Administered 2024-06-13: 150 mg via INTRAMUSCULAR

## 2024-06-13 NOTE — Progress Notes (Signed)
   NURSE VISIT- INJECTION  SUBJECTIVE:  Karla Ward is a 53 y.o. [phone] female here for a Depo Provera  for contraception/period management. She is a GYN patient.   OBJECTIVE:  There were no vitals taken for this visit.  Appears well, in no apparent distress  Injection administered in: Right deltoid  Meds ordered this encounter  Medications   medroxyPROGESTERone  (DEPO-PROVERA ) injection 150 mg    ASSESSMENT: GYN patient Depo Provera  for contraception/period management PLAN: Follow-up: in 11-13 weeks for next Depo   Aleck FORBES Blase  06/13/2024 10:26 AM

## 2024-06-20 ENCOUNTER — Telehealth: Payer: Self-pay | Admitting: Pharmacy Technician

## 2024-06-20 ENCOUNTER — Other Ambulatory Visit (HOSPITAL_COMMUNITY): Payer: Self-pay

## 2024-06-20 ENCOUNTER — Encounter: Payer: Self-pay | Admitting: Family Medicine

## 2024-06-20 ENCOUNTER — Ambulatory Visit: Admitting: Family Medicine

## 2024-06-20 VITALS — BP 122/80 | HR 76 | Resp 16 | Ht 64.0 in | Wt 183.0 lb

## 2024-06-20 DIAGNOSIS — E559 Vitamin D deficiency, unspecified: Secondary | ICD-10-CM

## 2024-06-20 DIAGNOSIS — R7301 Impaired fasting glucose: Secondary | ICD-10-CM

## 2024-06-20 DIAGNOSIS — I10 Essential (primary) hypertension: Secondary | ICD-10-CM | POA: Diagnosis not present

## 2024-06-20 DIAGNOSIS — E038 Other specified hypothyroidism: Secondary | ICD-10-CM

## 2024-06-20 MED ORDER — MECLIZINE HCL 25 MG PO TABS: 50.0000 mg | ORAL_TABLET | Freq: Two times a day (BID) | ORAL | 2 refills | Status: AC | PRN

## 2024-06-20 MED ORDER — ALBUTEROL SULFATE HFA 108 (90 BASE) MCG/ACT IN AERS: 2.0000 | INHALATION_SPRAY | RESPIRATORY_TRACT | 3 refills | Status: DC | PRN

## 2024-06-20 MED ORDER — PANTOPRAZOLE SODIUM 40 MG PO TBEC: 40.0000 mg | DELAYED_RELEASE_TABLET | Freq: Every day | ORAL | 3 refills | Status: AC

## 2024-06-20 MED ORDER — ONDANSETRON HCL 4 MG PO TABS: 4.0000 mg | ORAL_TABLET | ORAL | 3 refills | Status: AC | PRN

## 2024-06-20 MED ORDER — AMLODIPINE BESYLATE 5 MG PO TABS: 5.0000 mg | ORAL_TABLET | Freq: Every day | ORAL | 1 refills | Status: AC

## 2024-06-20 NOTE — Assessment & Plan Note (Signed)
 Vitals:   06/20/24 0818  BP: 122/80   Controlled, Continue Amlodipine  5 mg once daily  Labs ordered. Discussed with  patient to monitor their blood pressure regularly and maintain a heart-healthy diet rich in fruits, vegetables, whole grains, and low-fat dairy, while reducing sodium intake to less than 2,300 mg per day. Regular physical activity, such as 30 minutes of moderate exercise most days of the week, will help lower blood pressure and improve overall cardiovascular health. Avoiding smoking, limiting alcohol consumption, and managing stress. Take  prescribed medication, & take it as directed and avoid skipping doses. Seek emergency care if your blood pressure is (over 180/100) or you experience chest pain, shortness of breath, or sudden vision changes.Patient verbalizes understanding regarding plan of care and all questions answered.

## 2024-06-20 NOTE — Patient Instructions (Signed)

## 2024-06-20 NOTE — Telephone Encounter (Signed)
 Pharmacy Patient Advocate Encounter   Received notification from Onbase that prior authorization for Ventolin  HFA 90mcg/act aerosol is required/requested.   Insurance verification completed.   The patient is insured through [employer] [facility] .   Per test claim: Refill too soon. PA is not needed at this time. Medication was filled 06/20/2024. Next eligible fill date is 07/15/2024.  Certain NDC's of generic ProAir  are covered. Pharmacy is aware and made the substitution.

## 2024-06-20 NOTE — Telephone Encounter (Signed)
 Pharmacy Patient Advocate Encounter   Received notification from Onbase that prior authorization for Ondansetron  4mg  tablets is required/requested.   Insurance verification completed.   The patient is insured through [employer] [facility] .   Per test claim: Medication covered at  a quantity limit of 18 tablets every 21 days  Called pt's pharmacy and advised on dispensing limit set by the plan.

## 2024-06-20 NOTE — Progress Notes (Signed)
 Established Patient Office Visit   Subjective  Patient ID: Karla Ward, female    DOB: Oct 01, 1971  Age: 53 y.o. MRN: [id number]  Chief Complaint  Patient presents with   Follow-up    6 month follow up. Wants vitamin D  labs - she is fasting today    She  has a past medical history of Anemia, Dizziness, Gastritis, GERD (gastroesophageal reflux disease), Hypertension, Iron deficiency, Meniere disease, Status post dilation of esophageal narrowing, Vertigo, and Vitamin D  deficiency.  HPI Patient presents to the clinic for hypertension follow up. For the details of today's visit, please refer to assessment and plan.   Review of Systems  Constitutional:  Negative for chills and fever.  Eyes:  Negative for blurred vision.  Respiratory:  Negative for shortness of breath.   Cardiovascular:  Negative for chest pain.  Gastrointestinal:  Negative for abdominal pain.  Neurological:  Negative for dizziness and headaches.      Objective:     BP 122/80   Pulse 76   Resp 16   Ht 5' 4 (1.626 m)   Wt 183 lb (83 kg)   SpO2 98%   BMI 31.41 kg/m  BP Readings from Last 3 Encounters:  06/20/24 122/80  02/21/24 132/83  12/13/23 120/78      Physical Exam Vitals reviewed.  Constitutional:      General: She is not in acute distress.    Appearance: Normal appearance. She is not ill-appearing, toxic-appearing or diaphoretic.  HENT:     Head: Normocephalic.  Eyes:     General:        Right eye: No discharge.        Left eye: No discharge.     Conjunctiva/sclera: Conjunctivae normal.  Cardiovascular:     Rate and Rhythm: Normal rate.     Pulses: Normal pulses.     Heart sounds: Normal heart sounds.  Pulmonary:     Effort: Pulmonary effort is normal. No respiratory distress.     Breath sounds: Normal breath sounds.  Abdominal:     General: Bowel sounds are normal.     Palpations: Abdomen is soft.     Tenderness: There is no abdominal tenderness. There is no right CVA  tenderness, left CVA tenderness or guarding.  Skin:    General: Skin is warm and dry.  Neurological:     Mental Status: She is alert.     Coordination: Coordination normal.     Gait: Gait normal.  Psychiatric:        Mood and Affect: Mood normal.        Behavior: Behavior normal.      No results found for any visits on 06/20/24.  The 10-year ASCVD risk score (Arnett DK, et al., 2019) is: 3%    Assessment & Plan:  Primary hypertension Assessment & Plan: Vitals:   06/20/24 0818  BP: 122/80   Controlled, Continue Amlodipine  5 mg once daily  Labs ordered. Discussed with  patient to monitor their blood pressure regularly and maintain a heart-healthy diet rich in fruits, vegetables, whole grains, and low-fat dairy, while reducing sodium intake to less than 2,300 mg per day. Regular physical activity, such as 30 minutes of moderate exercise most days of the week, will help lower blood pressure and improve overall cardiovascular health. Avoiding smoking, limiting alcohol consumption, and managing stress. Take  prescribed medication, & take it as directed and avoid skipping doses. Seek emergency care if your blood pressure is (over 180/100) or  you experience chest pain, shortness of breath, or sudden vision changes.Patient verbalizes understanding regarding plan of care and all questions answered.    Orders: -     Lipid panel -     BMP8+eGFR -     CBC with Differential/Platelet  TSH (thyroid -stimulating hormone deficiency) -     TSH + free T4  IFG (impaired fasting glucose) -     Hemoglobin A1c  Vitamin D  deficiency -     VITAMIN D  25 Hydroxy (Vit-D Deficiency, Fractures)  Other orders -     amLODIPine  Besylate; Take 1 tablet (5 mg total) by mouth daily.  Dispense: 90 tablet; Refill: 1 -     Albuterol  Sulfate HFA; Inhale 2 puffs into the lungs every 4 (four) hours as needed for wheezing or shortness of breath.  Dispense: 18 g; Refill: 3 -     Pantoprazole  Sodium; Take 1 tablet  (40 mg total) by mouth daily. 30 minutes before eating.  Dispense: 90 tablet; Refill: 3 -     Meclizine  HCl; Take 2 tablets (50 mg total) by mouth 2 (two) times daily as needed.  Dispense: 60 tablet; Refill: 2 -     Ondansetron  HCl; Take 1 tablet (4 mg total) by mouth as needed.  Dispense: 20 tablet; Refill: 3    Return in about 6 months (around 12/21/2024), or if symptoms worsen or fail to improve, for hypertension, hyperlipidemia.   Hilario Kidd Wilhelmena Falter, FNP

## 2024-06-21 ENCOUNTER — Ambulatory Visit: Payer: Self-pay | Admitting: Family Medicine

## 2024-06-21 LAB — CBC WITH DIFFERENTIAL/PLATELET
Basophils Absolute: 0.1 x10E3/uL (ref 0.0–0.2)
Basos: 1 %
EOS (ABSOLUTE): 0.5 x10E3/uL — ABNORMAL HIGH (ref 0.0–0.4)
Eos: 7 %
Hematocrit: 40.8 % (ref 34.0–46.6)
Hemoglobin: 13.5 g/dL (ref 11.1–15.9)
Immature Grans (Abs): 0 x10E3/uL (ref 0.0–0.1)
Immature Granulocytes: 0 %
Lymphocytes Absolute: 2.3 x10E3/uL (ref 0.7–3.1)
Lymphs: 30 %
MCH: 29.4 pg (ref 26.6–33.0)
MCHC: 33.1 g/dL (ref 31.5–35.7)
MCV: 89 fL (ref 79–97)
Monocytes Absolute: 0.4 x10E3/uL (ref 0.1–0.9)
Monocytes: 5 %
Neutrophils Absolute: 4.4 x10E3/uL (ref 1.4–7.0)
Neutrophils: 57 %
Platelets: 303 x10E3/uL (ref 150–450)
RBC: 4.59 x10E6/uL (ref 3.77–5.28)
RDW: 13.3 % (ref 11.7–15.4)
WBC: 7.7 x10E3/uL (ref 3.4–10.8)

## 2024-06-21 LAB — BMP8+EGFR
BUN/Creatinine Ratio: 8 — ABNORMAL LOW (ref 9–23)
BUN: 8 mg/dL (ref 6–24)
CO2: 16 mmol/L — ABNORMAL LOW (ref 20–29)
Calcium: 9.4 mg/dL (ref 8.7–10.2)
Chloride: 108 mmol/L — ABNORMAL HIGH (ref 96–106)
Creatinine, Ser: 1.06 mg/dL — ABNORMAL HIGH (ref 0.57–1.00)
Glucose: 96 mg/dL (ref 70–99)
Potassium: 3.9 mmol/L (ref 3.5–5.2)
Sodium: 140 mmol/L (ref 134–144)
eGFR: 64 mL/min/1.73 (ref >59)

## 2024-06-21 LAB — HEMOGLOBIN A1C
Est. average glucose Bld gHb Est-mCnc: 105 mg/dL
Hgb A1c MFr Bld: 5.3 % (ref 4.8–5.6)

## 2024-06-21 LAB — TSH+FREE T4
Free T4: 1.15 ng/dL (ref 0.82–1.77)
TSH: 1.88 u[IU]/mL (ref 0.450–4.500)

## 2024-06-21 LAB — LIPID PANEL
Chol/HDL Ratio: 3.8 ratio (ref 0.0–4.4)
Cholesterol, Total: 203 mg/dL — ABNORMAL HIGH (ref 100–199)
HDL: 54 mg/dL (ref >39)
LDL Chol Calc (NIH): 140 mg/dL — ABNORMAL HIGH (ref 0–99)
Triglycerides: 52 mg/dL (ref 0–149)
VLDL Cholesterol Cal: 9 mg/dL (ref 5–40)

## 2024-06-21 LAB — VITAMIN D 25 HYDROXY (VIT D DEFICIENCY, FRACTURES): Vit D, 25-Hydroxy: 22.4 ng/mL — ABNORMAL LOW (ref 30.0–100.0)

## 2024-08-22 ENCOUNTER — Other Ambulatory Visit: Payer: Self-pay | Admitting: Family Medicine

## 2024-08-22 MED ORDER — ALBUTEROL SULFATE HFA 108 (90 BASE) MCG/ACT IN AERS: 2.0000 | INHALATION_SPRAY | RESPIRATORY_TRACT | 3 refills | Status: AC | PRN

## 2024-08-22 NOTE — Telephone Encounter (Signed)
 Copied from CRM [id number]. Topic: Clinical - Medication Refill >> Aug 22, 2024  9:54 AM Vanessa G wrote: Medication: albuterol  (VENTOLIN  HFA) 108 (90 Base) MCG/ACT inhaler  Has the patient contacted their pharmacy? Yes, referred to provider (Agent: If no, request that the patient contact the pharmacy for the refill. If patient does not wish to contact the pharmacy document the reason why and proceed with request.) (Agent: If yes, when and what did the pharmacy advise?)  This is the patient's preferred pharmacy:  [facility] - [place], [place] - [address] [place] [address] Phone: [phone] Fax: [phone]   Is this the correct pharmacy for this prescription? Yes If no, delete pharmacy and type the correct one.   Has the prescription been filled recently? No  Is the patient out of the medication? Yes  Has the patient been seen for an appointment in the last year OR does the patient have an upcoming appointment? Yes  Can we respond through MyChart? Yes  Agent: Please be advised that Rx refills may take up to 3 business days. We ask that you follow-up with your pharmacy.

## 2024-08-29 ENCOUNTER — Ambulatory Visit: Admitting: Adult Health

## 2024-08-29 ENCOUNTER — Encounter: Payer: Self-pay | Admitting: Adult Health

## 2024-08-29 VITALS — BP 136/84 | HR 79 | Ht 64.0 in | Wt 183.0 lb

## 2024-08-29 DIAGNOSIS — I10 Essential (primary) hypertension: Secondary | ICD-10-CM

## 2024-08-29 DIAGNOSIS — K648 Other hemorrhoids: Secondary | ICD-10-CM

## 2024-08-29 DIAGNOSIS — L290 Pruritus ani: Secondary | ICD-10-CM

## 2024-08-29 DIAGNOSIS — K6289 Other specified diseases of anus and rectum: Secondary | ICD-10-CM | POA: Diagnosis not present

## 2024-08-29 MED ORDER — HYDROCORTISONE ACETATE 25 MG RE SUPP: 25.0000 mg | Freq: Two times a day (BID) | RECTAL | 1 refills | Status: DC

## 2024-08-29 NOTE — Progress Notes (Signed)
  Subjective:     Patient ID: Karla Ward, female   DOB: 1970-11-01, 53 y.o.   MRN: [id number]  HPI Karla Ward is a 53 year old black female, married, G2P0202, in complaining of rectal itching and burning and pain in rectum when sitting, and with bowel movements.     Component Value Date/Time   DIAGPAP  07/06/2022 0847    - Negative for intraepithelial lesion or malignancy (NILM)   DIAGPAP (A) 01/20/2020 0902    - Atypical squamous cells of undetermined significance (ASC-US )   HPVHIGH Negative 07/06/2022 0847   HPVHIGH Negative 01/20/2020 0902   ADEQPAP  07/06/2022 0847    Satisfactory for evaluation; transformation zone component PRESENT.   ADEQPAP  01/20/2020 0902    Satisfactory for evaluation; transformation zone component PRESENT.   PCP is I Polanco NP  Review of Systems + rectal itching and burning and pain in rectum when sitting, and with bowel movements. Bowel movements have not been hard or loose    Reviewed past medical,surgical, social and family history. Reviewed medications and allergies.  Objective:   Physical Exam BP 136/84 (BP Location: Left Arm, Patient Position: Sitting, Cuff Size: Normal)   Pulse 79   Ht 5' 4 (1.626 m)   Wt 183 lb (83 kg)   BMI 31.41 kg/m     Skin is warm and dry. On rectal exam, no external hemorrhoid seen, no fissure noted, has good tone, and +internal hemorrhoid at about 7 0'clock. Fall risk is low  Upstream - 08/29/24 0846       Pregnancy Intention Screening   Does the patient want to become pregnant in the next year? N/A    Does the patient's partner want to become pregnant in the next year? N/A    Would the patient like to discuss contraceptive options today? N/A      Contraception Wrap Up   Current Method Hormonal Injection   ablation   End Method Hormonal Injection   ablation   Contraception Counseling Provided No         Examination chaperoned by Clarita Salt LPN  Assessment:     1. Rectal itching  2. Rectal  pain  3. Internal hemorrhoid (Primary) Has +internal hemorrhoid, will rx Anusol HC Supp 25 mg 1 bid for several days then prn  Meds ordered this encounter  Medications   hydrocortisone (ANUSOL-HC) 25 MG suppository    Sig: Place 1 suppository (25 mg total) rectally 2 (two) times daily.    Dispense:  12 suppository    Refill:  1    Supervising Provider:   JAYNE, LUTHER H [[id number]]     4. Hypertension, unspecified type Take Norvasc  5 mg 1 daily and follow up with PCP     Plan:     Follow up prn, has depo 09/05/24 can let me know then how she is

## 2024-09-05 ENCOUNTER — Ambulatory Visit

## 2024-09-05 DIAGNOSIS — Z3042 Encounter for surveillance of injectable contraceptive: Secondary | ICD-10-CM

## 2024-09-05 MED ORDER — MEDROXYPROGESTERONE ACETATE 150 MG/ML IM SUSP
150.0000 mg | Freq: Once | INTRAMUSCULAR | Status: AC
  Administered 2024-09-05: 150 mg via INTRAMUSCULAR

## 2024-09-05 NOTE — Progress Notes (Signed)
   NURSE VISIT- INJECTION  SUBJECTIVE:  Karla Ward is a 53 y.o. [phone] female here for a Depo Provera  for contraception/period management. She is a GYN patient.   OBJECTIVE:  There were no vitals taken for this visit.  Appears well, in no apparent distress  Injection administered in: Left deltoid  Meds ordered this encounter  Medications   medroxyPROGESTERone  (DEPO-PROVERA ) injection 150 mg    ASSESSMENT: GYN patient Depo Provera  for contraception/period management PLAN: Follow-up: in 11-13 weeks for next Depo   Aleck FORBES Blase  09/05/2024 2:21 PM

## 2024-10-08 ENCOUNTER — Ambulatory Visit
Admission: RE | Admit: 2024-10-08 | Discharge: 2024-10-08 | Disposition: A | Discharge status: Home / Self Care | Attending: Family Medicine

## 2024-10-08 VITALS — BP 133/84 | HR 109 | Temp 100.4°F | Resp 18

## 2024-10-08 DIAGNOSIS — U071 COVID-19: Secondary | ICD-10-CM

## 2024-10-08 LAB — POC COVID19/FLU A&B COMBO
Covid Antigen, POC: POSITIVE — AB
Influenza A Antigen, POC: NEGATIVE
Influenza B Antigen, POC: NEGATIVE

## 2024-10-08 MED ORDER — ACETAMINOPHEN 325 MG PO TABS
650.0000 mg | ORAL_TABLET | Freq: Once | ORAL | Status: AC
  Administered 2024-10-08: 650 mg via ORAL

## 2024-10-08 MED ORDER — BENZONATATE 100 MG PO CAPS: 100.0000 mg | ORAL_CAPSULE | Freq: Three times a day (TID) | ORAL | 0 refills | Status: DC | PRN

## 2024-10-08 NOTE — ED Triage Notes (Signed)
 Pt present body aches, chills and headaches, symptoms started yesterday. Pt tried OTC medication with no relief.

## 2024-10-08 NOTE — ED Provider Notes (Signed)
 [facility]    CSN: [id number] Arrival date & time: 10/08/24  1340      History   Chief Complaint Chief Complaint  Patient presents with   Chills    My body is aching and my head is hurting and Im stopped up - Entered by patient    HPI Karla Ward is a 53 y.o. female.   Patient dents today with 1 day history of tactile fever, body aches and chills, congested cough, runny and stuffy nose, sore throat, headache, vomiting, decreased appetite, and fatigue.  She denies shortness of breath or chest pain, abdominal pain, nausea, and diarrhea.  No known sick contacts.  Has taken over-the-counter allergy medicine and Tylenol  with minimal temporary improvement.    Past Medical History:  Diagnosis Date   Anemia    Dizziness    Gastritis    GERD (gastroesophageal reflux disease)    Hypertension    Iron deficiency    Meniere disease    Status post dilation of esophageal narrowing    Vertigo    Vitamin D  deficiency     Patient Active Problem List   Diagnosis Date Noted   Internal hemorrhoid 08/29/2024   Rectal pain 08/29/2024   Rectal itching 08/29/2024   Hypertension 08/22/2023   Screening mammogram for breast cancer 07/28/2023   History of UTI 07/06/2022   Other microscopic hematuria 07/06/2022   Constipation 07/05/2022   Urinary tract infection with hematuria 06/22/2022   Pelvic pain 06/22/2022   Urinary frequency 06/22/2022   Acute bilateral low back pain without sciatica 06/22/2022   Encounter for surveillance of injectable contraceptive 06/21/2021   Encounter for screening fecal occult blood testing 06/21/2021   Encounter for well woman exam with routine gynecological exam 06/21/2021   Atypical squamous cell changes of undetermined significance (ASCUS) on cervical cytology with negative high risk human papilloma virus (HPV) test result 06/21/2021   Encounter for colorectal cancer screening 01/20/2020   Encounter for gynecological examination  with Papanicolaou smear of cervix 01/20/2020   Colon cancer screening 07/06/2017   N&V (nausea and vomiting) 09/14/2016   Menorrhagia 05/30/2016   GERD (gastroesophageal reflux disease) 08/13/2015   Palpitations 11/22/2012    Past Surgical History:  Procedure Laterality Date   DILITATION & CURRETTAGE/HYSTROSCOPY WITH NOVASURE ABLATION N/A 04/05/2017   Procedure: DILATATION & CURETTAGE/HYSTEROSCOPY WITH NOVASURE ENDOMETRIAL ABLATION;  Surgeon: Jayne Vonn DEL, MD;  Location: [facility];  Service: Gynecology;  Laterality: N/A;   ESOPHAGOGASTRODUODENOSCOPY N/A 09/02/2015   DOQ:fpoi non-erosive gastritis/stricture at the gastroesophageal junction    NONE TO DATE  08/13/15   SAVORY DILATION  09/02/2015   Procedure: SAVORY DILATION;  Surgeon: Margo LITTIE Haddock, MD;  Location: [facility];  Service: Endoscopy;;    OB History     Gravida  2   Para  2   Term      Preterm  2   AB      Living  2      SAB      IAB      Ectopic      Multiple      Live Births  2            Home Medications    Prior to Admission medications   Medication Sig Start Date End Date Taking? Authorizing Provider  benzonatate  (TESSALON ) 100 MG capsule Take 1 capsule (100 mg total) by mouth 3 (three) times daily as needed for cough. Do not take with alcohol or while  operating or driving heavy machinery 87/0/74  Yes Chandra Harlene LABOR, NP  albuterol  (VENTOLIN  HFA) 108 (90 Base) MCG/ACT inhaler Inhale 2 puffs into the lungs every 4 (four) hours as needed for wheezing or shortness of breath. 08/22/24   Del Wilhelmena Lloyd Sola, FNP  Albuterol  Sulfate, sensor, (PROAIR  DIGIHALER) 108 (90 Base) MCG/ACT AEPB Inhale into the lungs as needed.    [provider]  amLODipine  (NORVASC ) 5 MG tablet Take 1 tablet (5 mg total) by mouth daily. 06/20/24   Del Wilhelmena Lloyd Sola, FNP  Ascorbic Acid (VITAMIN C PO) Take by mouth daily.    [provider]  clotrimazole -betamethasone  (LOTRISONE ) cream APPLY  TO AFFECTED AREA TWICE A DAY 04/09/24   Signa Delon LABOR, NP  fluticasone  (FLONASE ) 50 MCG/ACT nasal spray PLACE TWO SPRAYS INTO BOTH NOSTRILS DAILY AS NEEDED 08/22/24   Del Wilhelmena Lloyd Sola, FNP  hydrocortisone  (ANUSOL -HC) 25 MG suppository Place 1 suppository (25 mg total) rectally 2 (two) times daily. 08/29/24   Signa Delon LABOR, NP  meclizine  (ANTIVERT ) 25 MG tablet Take 2 tablets (50 mg total) by mouth 2 (two) times daily as needed. 06/20/24   Del Wilhelmena Lloyd, Sola, FNP  medroxyPROGESTERone  (DEPO-PROVERA ) 150 MG/ML injection INJECT 1 ML (150 MG TOTAL) INTO THE MUSCLE EVERY 3 (THREE) MONTHS 03/13/24   Signa Delon LABOR, NP  ondansetron  (ZOFRAN ) 4 MG tablet Take 1 tablet (4 mg total) by mouth as needed. 06/20/24   Del Wilhelmena Lloyd Sola, FNP  pantoprazole  (PROTONIX ) 40 MG tablet Take 1 tablet (40 mg total) by mouth daily. 30 minutes before eating. 06/20/24   Del Wilhelmena Lloyd, Sola, FNP  sucralfate  (CARAFATE ) 1 GM/10ML suspension Take 10 mLs (1 g total) by mouth 4 (four) times daily -  with meals and at bedtime for 14 days. 03/22/23 08/29/24  Shirlean Therisa ORN, NP  VITAMIN D  PO Take by mouth daily.    [provider]    Family History Family History  Problem Relation Age of Onset   Hypertension Mother    Throat cancer Father 47   Hypertension Father    Congestive Heart Failure Sister    Colon cancer Neg Hx    Colon polyps Neg Hx     Social History Social History   Tobacco Use   Smoking status: Never   Smokeless tobacco: Never  Vaping Use   Vaping status: Never Used  Substance Use Topics   Alcohol use: No   Drug use: No     Allergies   Benadryl  [diphenhydramine  hcl (sleep)], Ibuprofen , Penicillins, Vitamin d  analogs, and Iodinated contrast media   Review of Systems Review of Systems Per HPI  Physical Exam Triage Vital Signs ED Triage Vitals [10/08/24 1348]  Encounter Vitals Group     BP 133/84     Girls Systolic BP Percentile      Girls Diastolic  BP Percentile      Boys Systolic BP Percentile      Boys Diastolic BP Percentile      Pulse Rate (!) 109     Resp 18     Temp (!) 100.4 F (38 C)     Temp Source Oral     SpO2 98 %     Weight      Height      Head Circumference      Peak Flow      Pain Score 8     Pain Loc      Pain Education  Exclude from Growth Chart    No data found.  Updated Vital Signs BP 133/84 (BP Location: Left Arm)   Pulse (!) 109   Temp (!) 100.4 F (38 C) (Oral)   Resp 18   SpO2 98%   Visual Acuity Right Eye Distance:   Left Eye Distance:   Bilateral Distance:    Right Eye Near:   Left Eye Near:    Bilateral Near:     Physical Exam Vitals and nursing note reviewed.  Constitutional:      General: She is not in acute distress.    Appearance: Normal appearance. She is not ill-appearing or toxic-appearing.  HENT:     Head: Normocephalic and atraumatic.     Right Ear: Tympanic membrane, ear canal and external ear normal.     Left Ear: Tympanic membrane, ear canal and external ear normal.     Nose: No congestion or rhinorrhea.     Mouth/Throat:     Mouth: Mucous membranes are moist.     Pharynx: Oropharynx is clear. No oropharyngeal exudate or posterior oropharyngeal erythema.  Eyes:     General: No scleral icterus.    Extraocular Movements: Extraocular movements intact.  Cardiovascular:     Rate and Rhythm: Normal rate and regular rhythm.  Pulmonary:     Effort: Pulmonary effort is normal. No respiratory distress.     Breath sounds: Normal breath sounds. No wheezing, rhonchi or rales.  Musculoskeletal:     Cervical back: Normal range of motion and neck supple.  Lymphadenopathy:     Cervical: No cervical adenopathy.  Skin:    General: Skin is warm and dry.     Coloration: Skin is not jaundiced or pale.     Findings: No erythema or rash.  Neurological:     Mental Status: She is alert and oriented to person, place, and time.  Psychiatric:        Behavior: Behavior is  cooperative.      UC Treatments / Results  Labs (all labs ordered are listed, but only abnormal results are displayed) Labs Reviewed  POC COVID19/FLU A&B COMBO - Abnormal; Notable for the following components:      Result Value   Covid Antigen, POC Positive (*)    All other components within normal limits    EKG   Radiology No results found.  Procedures Procedures (including critical care time)  Medications Ordered in UC Medications  acetaminophen  (TYLENOL ) tablet 650 mg (650 mg Oral Given 10/08/24 1357)    Initial Impression / Assessment and Plan / UC Course  I have reviewed the triage vital signs and the nursing notes.  Pertinent labs & imaging results that were available during my care of the patient were reviewed by me and considered in my medical decision making (see chart for details).   Patient is a very pleasant, well-appearing 53 year old female presenting today for viral symptoms.  In triage, patient is febrile and slightly tachycardic.  Tylenol  was given and fever improved to 99.3 F; heart rate improved to 105 bpm.  Vital signs are stable otherwise.  Examination is reassuring.  COVID-19 testing is positive today.  I offered Paxlovid, patient declines at this time.  Supportive care discussed including cough suppressant medication, guaifenesin, hydration.  ER and return precautions discussed with the patient.  Work excuse provided.  The patient was given the opportunity to ask questions.  All questions answered to their satisfaction.  The patient is in agreement to this plan.   Final  Clinical Impressions(s) / UC Diagnoses   Final diagnoses:  COVID-19     Discharge Instructions      You tested positive for COVID-19.  Symptoms should improve over the next week to 10 days.  If you develop chest pain or shortness of breath, go to the emergency room.  Some things that can make you feel better are: - Increased rest - Increasing fluid with water /sugar free  electrolytes - Acetaminophen  and ibuprofen  as needed for fever/pain - Salt water  gargling, chloraseptic spray and throat lozenges - OTC guaifenesin (Mucinex) 600 mg twice daily - Saline sinus flushes or a neti pot - Humidifying the air --Tessalon  Perles every 8 hours as needed for dry cough   Please wear a mask until it has been 10 days since your symptoms started     ED Prescriptions     Medication Sig Dispense Auth. Provider   benzonatate  (TESSALON ) 100 MG capsule Take 1 capsule (100 mg total) by mouth 3 (three) times daily as needed for cough. Do not take with alcohol or while operating or driving heavy machinery 21 capsule Chandra Harlene LABOR, NP      PDMP not reviewed this encounter.   Chandra Harlene LABOR, NP 10/08/24 1620

## 2024-10-08 NOTE — Discharge Instructions (Signed)
 You tested positive for COVID-19.  Symptoms should improve over the next week to 10 days.  If you develop chest pain or shortness of breath, go to the emergency room.  Some things that can make you feel better are: - Increased rest - Increasing fluid with water /sugar free electrolytes - Acetaminophen  and ibuprofen  as needed for fever/pain - Salt water  gargling, chloraseptic spray and throat lozenges - OTC guaifenesin (Mucinex) 600 mg twice daily - Saline sinus flushes or a neti pot - Humidifying the air --Tessalon  Perles every 8 hours as needed for dry cough   Please wear a mask until it has been 10 days since your symptoms started

## 2024-10-11 DIAGNOSIS — U071 COVID-19: Secondary | ICD-10-CM | POA: Diagnosis not present

## 2024-11-13 ENCOUNTER — Ambulatory Visit: Admitting: Adult Health

## 2024-11-28 ENCOUNTER — Ambulatory Visit: Admitting: Adult Health

## 2024-11-28 ENCOUNTER — Encounter: Payer: Self-pay | Admitting: Adult Health

## 2024-11-28 VITALS — BP 138/81 | HR 75 | Ht 64.0 in | Wt 182.0 lb

## 2024-11-28 DIAGNOSIS — Z1331 Encounter for screening for depression: Secondary | ICD-10-CM

## 2024-11-28 DIAGNOSIS — Z3042 Encounter for surveillance of injectable contraceptive: Secondary | ICD-10-CM | POA: Diagnosis not present

## 2024-11-28 DIAGNOSIS — Z01419 Encounter for gynecological examination (general) (routine) without abnormal findings: Secondary | ICD-10-CM | POA: Diagnosis not present

## 2024-11-28 DIAGNOSIS — Z1231 Encounter for screening mammogram for malignant neoplasm of breast: Secondary | ICD-10-CM

## 2024-11-28 MED ORDER — MEDROXYPROGESTERONE ACETATE 150 MG/ML IM SUSP: 150.0000 mg | INTRAMUSCULAR | 1 refills | Status: AC

## 2024-11-28 MED ORDER — MEDROXYPROGESTERONE ACETATE 150 MG/ML IM SUSY
150.0000 mg | PREFILLED_SYRINGE | Freq: Once | INTRAMUSCULAR | Status: AC
  Administered 2024-11-28: 150 mg via INTRAMUSCULAR

## 2024-11-28 NOTE — Progress Notes (Signed)
 Patient ID: Karla Ward, female   DOB: [birth year], 54 y.o.   MRN: [id number] History of Present Illness: Karla Ward is a 54 year old black female, married, G2P0202, in for a well woman gyn exam and pap. And she gets depo today, and wants to discuss when she should stop.  PCP is I Polanco NP   Current Medications, Allergies, Past Medical History, Past Surgical History, Family History and Social History were reviewed in [name] record.     Review of Systems: Patient denies any headaches, hearing loss, fatigue, blurred vision, shortness of breath, chest pain, abdominal pain, problems with bowel movements, urination, or intercourse. No joint pain or mood swings.  She denies any  vaginal bleeding    Physical Exam:BP 138/81 (BP Location: Left Arm, Patient Position: Sitting, Cuff Size: Normal)   Pulse 75   Ht 5' 4 (1.626 m)   Wt 182 lb (82.6 kg)   BMI 31.24 kg/m   General:  Well developed, well nourished, no acute distress Skin:  Warm and dry Neck:  Midline trachea, normal thyroid , good ROM, no lymphadenopathy Lungs; Clear to auscultation bilaterally Breast:  No dominant palpable mass, retraction, or nipple discharge Cardiovascular: Regular rate and rhythm Abdomen:  Soft, non tender, no hepatosplenomegaly Pelvic:  External genitalia is normal in appearance, no lesions.  The vagina is normal in appearance. Urethra has no lesions or masses. The cervix is smooth, pap with HR HPV genotyping performed.  Uterus is felt to be normal size, shape, and contour.  No adnexal masses or tenderness noted.Bladder is non tender, no masses felt. Rectal: Deferred Extremities/musculoskeletal:  No swelling or varicosities noted, no clubbing or cyanosis Psych:  No mood changes, alert and cooperative,seems happy AA is 0 Fall risk is low    11/28/2024   10:05 AM 06/20/2024    8:19 AM 08/22/2023    8:27 AM  Depression screen PHQ 2/9  Decreased Interest 0 0 0  Down, Depressed,  Hopeless 0 0 0  PHQ - 2 Score 0 0 0  Altered sleeping 0  0  Tired, decreased energy 0  0  Change in appetite 0  0  Feeling bad or failure about yourself  0  0  Trouble concentrating 0  0  Moving slowly or fidgety/restless 0  0  Suicidal thoughts 0  0  PHQ-9 Score 0  0   Difficult doing work/chores   Not difficult at all     Data saved with a previous flowsheet row definition       11/28/2024   10:05 AM 08/22/2023    8:27 AM 07/28/2023   10:37 AM 07/06/2022    8:41 AM  GAD 7 : Generalized Anxiety Score  Nervous, Anxious, on Edge 0 0  0  0   Control/stop worrying 0 0  0  0   Worry too much - different things 0 0  0  2   Trouble relaxing 0 0  0  0   Restless 0 0  0  0   Easily annoyed or irritable 0 0  0  0   Afraid - awful might happen 0 0  0  0   Total GAD 7 Score 0 0 0 2  Anxiety Difficulty  Not difficult at all       Data saved with a previous flowsheet row definition    Upstream - 11/28/24 1001       Pregnancy Intention Screening   Does the patient want to become  pregnant in the next year? No    Does the patient's partner want to become pregnant in the next year? No    Would the patient like to discuss contraceptive options today? No      Contraception Wrap Up   Current Method Hormonal Injection    End Method Hormonal Injection    Contraception Counseling Provided Yes           Examination chaperoned by Clarita Salt LPN  Impression and plan: 1. Encounter for gynecological examination with Papanicolaou smear of cervix (Primary) Pap sent Pap in 3 years If negative Physical in 1 year Labs with PCP  Had cologuard 01/13/24 negative  - Cytology - PAP( [place])  2. Encounter for surveillance of injectable contraceptive Depo given in office and refill sent Meds ordered this encounter  Medications   medroxyPROGESTERone  Acetate SUSY 150 mg   medroxyPROGESTERone  (DEPO-PROVERA ) 150 MG/ML injection    Sig: Inject 1 mL (150 mg total) into the muscle every 3 (three)  months.    Dispense:  1 mL    Refill:  1    Supervising Provider:   JAYNE MINDER H [[id number]]   Discussed stopping depo after this injection and checking FSH in 4 weeks after it is due, she wants to continue depo for 1 mor time  - medroxyPROGESTERone  Acetate SUSY 150 mg Return in 12 weeks for depo  3. Screening mammogram for breast cancer Pt to call for appt - MM 3D SCREENING MAMMOGRAM BILATERAL BREAST; Future

## 2024-12-03 ENCOUNTER — Ambulatory Visit
Admission: RE | Admit: 2024-12-03 | Discharge: 2024-12-03 | Disposition: A | Discharge status: Home / Self Care | Source: Home / Self Care

## 2024-12-03 ENCOUNTER — Ambulatory Visit: Payer: Self-pay | Admitting: Adult Health

## 2024-12-03 ENCOUNTER — Other Ambulatory Visit: Payer: Self-pay

## 2024-12-03 VITALS — BP 121/83 | HR 100 | Temp 98.6°F | Resp 18

## 2024-12-03 DIAGNOSIS — J4521 Mild intermittent asthma with (acute) exacerbation: Secondary | ICD-10-CM

## 2024-12-03 DIAGNOSIS — J069 Acute upper respiratory infection, unspecified: Secondary | ICD-10-CM | POA: Diagnosis not present

## 2024-12-03 LAB — CYTOLOGY - PAP
Comment: NEGATIVE
Diagnosis: NEGATIVE
High risk HPV: NEGATIVE

## 2024-12-03 LAB — POC COVID19/FLU A&B COMBO
Covid Antigen, POC: NEGATIVE
Influenza A Antigen, POC: NEGATIVE
Influenza B Antigen, POC: NEGATIVE

## 2024-12-03 MED ORDER — AZELASTINE HCL 0.1 % NA SOLN: 1.0000 | Freq: Two times a day (BID) | NASAL | 0 refills | Status: AC

## 2024-12-03 MED ORDER — PROMETHAZINE-DM 6.25-15 MG/5ML PO SYRP: 5.0000 mL | ORAL_SOLUTION | Freq: Four times a day (QID) | ORAL | 0 refills | Status: AC | PRN

## 2024-12-03 MED ORDER — PREDNISONE 20 MG PO TABS: 40.0000 mg | ORAL_TABLET | Freq: Every day | ORAL | 0 refills | Status: AC

## 2024-12-03 NOTE — ED Triage Notes (Addendum)
 Pt reports sore throat,cough, chills, intermittent wheezing since last night. Has tried tylenol  and reports minimal change in symptoms.

## 2025-02-20 ENCOUNTER — Ambulatory Visit
